# Patient Record
Sex: Female | Born: 1955
Health system: Southern US, Community
[De-identification: ages and names within clinical notes are randomized; demographics above are authoritative.]

## PROBLEM LIST (undated history)

## (undated) DIAGNOSIS — G43909 Migraine, unspecified, not intractable, without status migrainosus: Secondary | ICD-10-CM

## (undated) DIAGNOSIS — E039 Hypothyroidism, unspecified: Secondary | ICD-10-CM

## (undated) DIAGNOSIS — N915 Oligomenorrhea, unspecified: Secondary | ICD-10-CM

## (undated) DIAGNOSIS — R112 Nausea with vomiting, unspecified: Secondary | ICD-10-CM

## (undated) DIAGNOSIS — L709 Acne, unspecified: Secondary | ICD-10-CM

## (undated) DIAGNOSIS — F32A Depression, unspecified: Secondary | ICD-10-CM

## (undated) DIAGNOSIS — M75102 Unspecified rotator cuff tear or rupture of left shoulder, not specified as traumatic: Secondary | ICD-10-CM

## (undated) DIAGNOSIS — B069 Rubella without complication: Secondary | ICD-10-CM

## (undated) DIAGNOSIS — N83209 Unspecified ovarian cyst, unspecified side: Secondary | ICD-10-CM

## (undated) DIAGNOSIS — F329 Major depressive disorder, single episode, unspecified: Secondary | ICD-10-CM

## (undated) DIAGNOSIS — Z9889 Other specified postprocedural states: Secondary | ICD-10-CM

## (undated) DIAGNOSIS — R011 Cardiac murmur, unspecified: Secondary | ICD-10-CM

## (undated) DIAGNOSIS — E559 Vitamin D deficiency, unspecified: Secondary | ICD-10-CM

## (undated) DIAGNOSIS — M1990 Unspecified osteoarthritis, unspecified site: Secondary | ICD-10-CM

## (undated) DIAGNOSIS — M75101 Unspecified rotator cuff tear or rupture of right shoulder, not specified as traumatic: Secondary | ICD-10-CM

## (undated) DIAGNOSIS — Z973 Presence of spectacles and contact lenses: Secondary | ICD-10-CM

## (undated) HISTORY — PX: CRYOTHERAPY: SHX1416

## (undated) HISTORY — DX: Vitamin D deficiency, unspecified: E55.9

## (undated) HISTORY — DX: Oligomenorrhea, unspecified: N91.5

## (undated) HISTORY — DX: Unspecified ovarian cyst, unspecified side: N83.209

## (undated) HISTORY — PX: TONSILLECTOMY: SUR1361

## (undated) HISTORY — DX: Major depressive disorder, single episode, unspecified: F32.9

## (undated) HISTORY — DX: Acne, unspecified: L70.9

## (undated) HISTORY — DX: Migraine, unspecified, not intractable, without status migrainosus: G43.909

## (undated) HISTORY — DX: Rubella without complication: B06.9

## (undated) HISTORY — DX: Depression, unspecified: F32.A

## (undated) HISTORY — PX: OTHER SURGICAL HISTORY: SHX169

---

## 1998-11-07 ENCOUNTER — Other Ambulatory Visit: Admission: RE | Admit: 1998-11-07 | Discharge: 1998-11-07 | Payer: Self-pay | Admitting: Obstetrics and Gynecology

## 1999-04-07 ENCOUNTER — Ambulatory Visit (HOSPITAL_COMMUNITY): Admission: RE | Admit: 1999-04-07 | Discharge: 1999-04-07 | Payer: Self-pay | Admitting: Gastroenterology

## 1999-04-07 ENCOUNTER — Encounter: Payer: Self-pay | Admitting: Gastroenterology

## 1999-05-08 ENCOUNTER — Other Ambulatory Visit: Admission: RE | Admit: 1999-05-08 | Discharge: 1999-05-08 | Payer: Self-pay | Admitting: *Deleted

## 2000-06-26 ENCOUNTER — Encounter: Payer: Self-pay | Admitting: Gastroenterology

## 2000-06-28 ENCOUNTER — Other Ambulatory Visit: Admission: RE | Admit: 2000-06-28 | Discharge: 2000-06-28 | Payer: Self-pay | Admitting: Obstetrics & Gynecology

## 2001-11-05 ENCOUNTER — Other Ambulatory Visit: Admission: RE | Admit: 2001-11-05 | Discharge: 2001-11-05 | Payer: Self-pay | Admitting: Obstetrics & Gynecology

## 2002-10-29 HISTORY — PX: BUNIONECTOMY: SHX129

## 2002-11-10 ENCOUNTER — Other Ambulatory Visit: Admission: RE | Admit: 2002-11-10 | Discharge: 2002-11-10 | Payer: Self-pay | Admitting: Obstetrics & Gynecology

## 2003-11-23 ENCOUNTER — Other Ambulatory Visit: Admission: RE | Admit: 2003-11-23 | Discharge: 2003-11-23 | Payer: Self-pay | Admitting: Obstetrics & Gynecology

## 2008-09-02 ENCOUNTER — Encounter: Admission: RE | Admit: 2008-09-02 | Discharge: 2008-09-02 | Payer: Self-pay | Admitting: Specialist

## 2010-10-31 ENCOUNTER — Encounter (INDEPENDENT_AMBULATORY_CARE_PROVIDER_SITE_OTHER): Payer: Self-pay | Admitting: *Deleted

## 2010-11-08 ENCOUNTER — Telehealth: Payer: Self-pay | Admitting: Gastroenterology

## 2010-11-17 ENCOUNTER — Encounter (INDEPENDENT_AMBULATORY_CARE_PROVIDER_SITE_OTHER): Payer: Self-pay

## 2010-11-21 ENCOUNTER — Ambulatory Visit
Admission: RE | Admit: 2010-11-21 | Discharge: 2010-11-21 | Payer: Self-pay | Source: Home / Self Care | Attending: Gastroenterology | Admitting: Gastroenterology

## 2010-11-30 NOTE — Miscellaneous (Signed)
Summary: Lec previsit  Clinical Lists Changes  Medications: Added new medication of MOVIPREP 100 GM  SOLR (PEG-KCL-NACL-NASULF-NA ASC-C) As per prep instructions. - Signed Rx of MOVIPREP 100 GM  SOLR (PEG-KCL-NACL-NASULF-NA ASC-C) As per prep instructions.;  #1 x 0;  Signed;  Entered by: Ulis Rias RN;  Authorized by: Louis Meckel MD;  Method used: Electronically to [facility]*, [address] Shipping/mailing, [place], [place]  [address], Ph: [phone], Fax: [phone] Allergies: Added new allergy or adverse reaction of * LATEX Observations: Added new observation of NKA: F (11/21/2010 12:50)    Prescriptions: MOVIPREP 100 GM  SOLR (PEG-KCL-NACL-NASULF-NA ASC-C) As per prep instructions.  #1 x 0   Entered by:   Ulis Rias RN   Authorized by:   Louis Meckel MD   Signed by:   Ulis Rias RN on 11/21/2010   Method used:   Electronically to        [facility]* (retail)       [address]       [address] Shipping/mailing       [place], [place]  [address]       Ph: [phone]       Fax: [phone]   RxID:   [id number]

## 2010-11-30 NOTE — Letter (Signed)
Summary: [facility] Instructions  [facility]  [address] [place], [place] [address]   Phone: [phone]  Fax: [phone]       Stacey Kelly    1956-06-08    MRN: [id number]        Procedure Day /Date: Friday 12-15-10     Arrival Time: 1:00 pm     Procedure Time: 2:00 pm     Location of Procedure:                    _x _  [facility] (4th Floor)                        PREPARATION FOR COLONOSCOPY WITH MOVIPREP   Starting 5 days prior to your procedure  12-10-10 do not eat nuts, seeds, popcorn, corn, beans, peas,  salads, or any raw vegetables.  Do not take any fiber supplements (e.g. Metamucil, Citrucel, and Benefiber).  THE DAY BEFORE YOUR PROCEDURE         DATE:  12-14-10   DAY:  Thursday   1.  Drink clear liquids the entire day-NO SOLID FOOD  2.  Do not drink anything colored red or purple.  Avoid juices with pulp.  No orange juice.  3.  Drink at least 64 oz. (8 glasses) of fluid/clear liquids during the day to prevent dehydration and help the prep work efficiently.  CLEAR LIQUIDS INCLUDE: Water Jello Ice Popsicles Tea (sugar ok, no milk/cream) Powdered fruit flavored drinks Coffee (sugar ok, no milk/cream) Gatorade Juice: apple, white grape, white cranberry  Lemonade Clear bullion, consomm, broth Carbonated beverages (any kind) Strained chicken noodle soup Hard Candy                             4.  In the morning, mix first dose of MoviPrep solution:    Empty 1 Pouch A and 1 Pouch B into the disposable container    Add lukewarm drinking water to the top line of the container. Mix to dissolve    Refrigerate (mixed solution should be used within 24 hrs)  5.  Begin drinking the prep at 5:00 p.m. The MoviPrep container is divided by 4 marks.   Every 15 minutes drink the solution down to the next mark (approximately 8 oz) until the full liter is complete.   6.  Follow completed prep with 16 oz of clear liquid of your choice  (Nothing red or purple).  Continue to drink clear liquids until bedtime.  7.  Before going to bed, mix second dose of MoviPrep solution:    Empty 1 Pouch A and 1 Pouch B into the disposable container    Add lukewarm drinking water to the top line of the container. Mix to dissolve    Refrigerate  THE DAY OF YOUR PROCEDURE      DATE:  12-15-10  DAY:  Friday  Beginning at  9:00 a.m. (5 hours before procedure):         1. Every 15 minutes, drink the solution down to the next mark (approx 8 oz) until the full liter is complete.  2. Follow completed prep with 16 oz. of clear liquid of your choice.    3. You may drink clear liquids until  12:00 p.m.  (2 HOURS BEFORE PROCEDURE).   MEDICATION INSTRUCTIONS  Unless otherwise instructed, you should take regular prescription medications with a small sip  of water   as early as possible the morning of your procedure.         OTHER INSTRUCTIONS  You will need a responsible adult at least 53 years of age to accompany you and drive you home.   This person must remain in the waiting room during your procedure.  Wear loose fitting clothing that is easily removed.  Leave jewelry and other valuables at home.  However, you may wish to bring a book to read or  an iPod/MP3 player to listen to music as you wait for your procedure to start.  Remove all body piercing jewelry and leave at home.  Total time from sign-in until discharge is approximately 2-3 hours.  You should go home directly after your procedure and rest.  You can resume normal activities the  day after your procedure.  The day of your procedure you should not:   Drive   Make legal decisions   Operate machinery   Drink alcohol   Return to work  You will receive specific instructions about eating, activities and medications before you leave.    The above instructions have been reviewed and explained to me by   Ulis Rias RN  November 21, 2010 1:13 PM     I fully  understand and can verbalize these instructions _____________________________ Date _________

## 2010-11-30 NOTE — Progress Notes (Signed)
Summary: Triage  Phone Note Call from Patient Call back at [phone]   Caller: Patient Call For: Dr. Arlyce Dice Reason for Call: Talk to Nurse Summary of Call: Pt. is allergic to Latex and wants to discuss prior to her COL Initial call taken by: Karna Christmas,  November 08, 2010 9:12 AM  Follow-up for Phone Call        Spoke with patient and let her know that all products we use are latex free. Follow-up by: Selinda Michaels RN,  November 08, 2010 9:23 AM

## 2010-11-30 NOTE — Letter (Signed)
Summary: Pre Visit Letter Revised  [facility]  [address]   [place], [place] [address]   Phone: [phone]  Fax: [phone]        10/31/2010 MRN: [id number] Stacey Kelly [address] [place], [place]  [address]             Procedure Date:  December 15, 2010  Welcome to the Gastroenterology Division at [facility].    You are scheduled to see a nurse for your pre-procedure visit on November 21, 2010 at 1:00pm on the 3rd floor at [facility], [address]  We ask that you try to arrive at our office 15 minutes prior to your appointment time to allow for check-in.  Please take a minute to review the attached form.  If you answer "Yes" to one or more of the questions on the first page, we ask that you call the person listed at your earliest opportunity.  If you answer "No" to all of the questions, please complete the rest of the form and bring it to your appointment.    Your nurse visit will consist of discussing your medical and surgical history, your immediate family medical history, and your medications.   If you are unable to list all of your medications on the form, please bring the medication bottles to your appointment and we will list them.  We will need to be aware of both prescribed and over the counter drugs.  We will need to know exact dosage information as well.    Please be prepared to read and sign documents such as consent forms, a financial agreement, and acknowledgement forms.  If necessary, and with your consent, a friend or relative is welcome to sit-in on the nurse visit with you.  Please bring your insurance card so that we may make a copy of it.  If your insurance requires a referral to see a specialist, please bring your referral form from your primary care physician.  No co-pay is required for this nurse visit.     If you cannot keep your appointment, please call [phone] to cancel or reschedule prior to your appointment date.   This allows us the opportunity to schedule an appointment for another patient in need of care.    Thank you for choosing [facility] for your medical needs.  We appreciate the opportunity to care for you.  Please visit us at our website  to learn more about our practice.  Sincerely, The Gastroenterology Division         

## 2010-11-30 NOTE — Letter (Signed)
Summary: Pre Visit Letter Revised  [facility]  [address] [place], [place] [address]   Phone: [phone]  Fax: [phone]        10/31/2010 MRN: [id number] Stacey Kelly [facility] [place], [place]  [address]             Procedure Date:  December 15, 2010  Welcome to the Gastroenterology Division at [facility].    You are scheduled to see a nurse for your pre-procedure visit on November 21, 2010 at 1:00pm on the 3rd floor at [facility], [address]  We ask that you try to arrive at our office 15 minutes prior to your appointment time to allow for check-in.  Please take a minute to review the attached form.  If you answer "Yes" to one or more of the questions on the first page, we ask that you call the person listed at your earliest opportunity.  If you answer "No" to all of the questions, please complete the rest of the form and bring it to your appointment.    Your nurse visit will consist of discussing your medical and surgical history, your immediate family medical history, and your medications.   If you are unable to list all of your medications on the form, please bring the medication bottles to your appointment and we will list them.  We will need to be aware of both prescribed and over the counter drugs.  We will need to know exact dosage information as well.    Please be prepared to read and sign documents such as consent forms, a financial agreement, and acknowledgement forms.  If necessary, and with your consent, a friend or relative is welcome to sit-in on the nurse visit with you.  Please bring your insurance card so that we may make a copy of it.  If your insurance requires a referral to see a specialist, please bring your referral form from your primary care physician.  No co-pay is required for this nurse visit.     If you cannot keep your appointment, please call [phone] to cancel or reschedule prior to your appointment date.   This allows [place] the opportunity to schedule an appointment for another patient in need of care.    Thank you for choosing [facility] for your medical needs.  We appreciate the opportunity to care for you.  Please visit [place] at our website  to learn more about our practice.  Sincerely, The Gastroenterology Division

## 2010-12-15 ENCOUNTER — Other Ambulatory Visit (AMBULATORY_SURGERY_CENTER): Payer: 59 | Admitting: Gastroenterology

## 2010-12-15 ENCOUNTER — Encounter: Payer: Self-pay | Admitting: Gastroenterology

## 2010-12-15 DIAGNOSIS — Z1211 Encounter for screening for malignant neoplasm of colon: Secondary | ICD-10-CM

## 2010-12-20 NOTE — Procedures (Addendum)
Summary: Colonoscopy  Patient: Stacey Kelly Note: All result statuses are Final unless otherwise noted.  Tests: (1) Colonoscopy (COL)   COL Colonoscopy           DONE (C)     [facility]     [address]     [place], [place]  [address]           COLONOSCOPY PROCEDURE REPORT           PATIENT:  [name], [name]  MR#:  [id number]     BIRTHDATE:  Jan 01, 1956, 54 yrs. old  GENDER:  female           ENDOSCOPIST:  Barbette Hair. Arlyce Dice, MD     Referred by:  Silver Huguenin, M.D.           PROCEDURE DATE:  12/15/2010     PROCEDURE:  Diagnostic Colonoscopy     ASA CLASS:  Class I     INDICATIONS:  1) Routine Risk Screening           MEDICATIONS:   Fentanyl 75 mcg IV, Versed 7 mg IV, Benadryl 12.5     mg IV           DESCRIPTION OF PROCEDURE:   After the risks benefits and     alternatives of the procedure were thoroughly explained, informed     consent was obtained.  Digital rectal exam was performed and     revealed no abnormalities.   The LB CF-[id number] endoscope     was introduced through the anus and advanced to the cecum, which     was identified by both the appendix and ileocecal valve, without     limitations.  The quality of the prep was excellent, using     MoviPrep.  The instrument was then slowly withdrawn as the colon     was fully examined.     <<PROCEDUREIMAGES>>           FINDINGS:  A normal appearing cecum, ileocecal valve, and     appendiceal orifice were identified. The ascending, hepatic     flexure, transverse, splenic flexure, descending, sigmoid colon,     and rectum appeared unremarkable (see image1, image2, image3,     image4, image6, image9, image12, and image13).   Retroflexed views     in the rectum revealed no abnormalities.    The time to cecum =     6.0  minutes. The scope was then withdrawn (time =  8.50  min) from     the patient and the procedure completed.           COMPLICATIONS:  None           ENDOSCOPIC IMPRESSION:     1) Normal  colon     RECOMMENDATIONS:     1) Continue current colorectal screening recommendations for     "routine risk" patients with a repeat colonoscopy in 10 years.           REPEAT EXAM:   10 year(s) Colonoscopy           ______________________________     Barbette Hair. Arlyce Dice, MD           CC:           n.     REVISED:  12/19/2010 08:16 AM     eSIGNED:   Barbette Hair. Almalik Weissberg at 12/19/2010 08:16 AM           Rachael Darby,  [id number]  Note: An exclamation mark (!) indicates a result that was not dispersed into the flowsheet. Document Creation Date: 12/19/2010 8:16 AM _______________________________________________________________________  (1) Order result status: Final Collection or observation date-time: 12/15/2010 15:16 Requested date-time:  Receipt date-time:  Reported date-time:  Referring Physician:   Ordering Physician: Melvia Heaps [phone]) Specimen Source:  Source: Launa Grill Order Number: [id number] Lab site:

## 2010-12-26 NOTE — Letter (Signed)
Summary: [facility] records 2000-01  [facility] records 2000-01   Imported By: Lester Bryan 12/18/2010 10:45:15  _____________________________________________________________________  External Attachment:    Type:   Image     Comment:   External Document

## 2013-10-19 ENCOUNTER — Other Ambulatory Visit (HOSPITAL_COMMUNITY): Payer: Self-pay | Admitting: Specialist

## 2013-10-19 DIAGNOSIS — M25512 Pain in left shoulder: Secondary | ICD-10-CM

## 2013-10-27 ENCOUNTER — Ambulatory Visit (HOSPITAL_COMMUNITY): Payer: 59

## 2013-11-10 ENCOUNTER — Ambulatory Visit (HOSPITAL_COMMUNITY)
Admission: RE | Admit: 2013-11-10 | Discharge: 2013-11-10 | Disposition: A | Discharge status: Home / Self Care | Payer: 59 | Source: Ambulatory Visit | Attending: Specialist | Admitting: Specialist

## 2013-11-10 DIAGNOSIS — S46819A Strain of other muscles, fascia and tendons at shoulder and upper arm level, unspecified arm, initial encounter: Secondary | ICD-10-CM | POA: Insufficient documentation

## 2013-11-10 DIAGNOSIS — M25519 Pain in unspecified shoulder: Secondary | ICD-10-CM | POA: Insufficient documentation

## 2013-11-10 DIAGNOSIS — M719 Bursopathy, unspecified: Secondary | ICD-10-CM | POA: Insufficient documentation

## 2013-11-10 DIAGNOSIS — X58XXXA Exposure to other specified factors, initial encounter: Secondary | ICD-10-CM | POA: Insufficient documentation

## 2013-11-10 DIAGNOSIS — M25512 Pain in left shoulder: Secondary | ICD-10-CM

## 2013-11-10 DIAGNOSIS — M67919 Unspecified disorder of synovium and tendon, unspecified shoulder: Secondary | ICD-10-CM | POA: Insufficient documentation

## 2014-03-12 ENCOUNTER — Encounter (HOSPITAL_BASED_OUTPATIENT_CLINIC_OR_DEPARTMENT_OTHER): Payer: Self-pay | Admitting: *Deleted

## 2014-03-12 ENCOUNTER — Other Ambulatory Visit: Payer: Self-pay | Admitting: Orthopedic Surgery

## 2014-03-12 NOTE — Progress Notes (Signed)
NPO AFTER MN WITH EXCEPTION CLEAR LIQUIDS UNTIL 0830 (NO CREAM/ MILK PRODUCTS).  ARRIVE AT 1230. NEEDS HG. 

## 2014-03-12 NOTE — Progress Notes (Signed)
NPO AFTER MN WITH EXCEPTION CLEAR LIQUIDS UNTIL 0830 (NO CREAM/ MILK PRODUCTS).  ARRIVE AT 1230. NEEDS HG.

## 2014-03-17 ENCOUNTER — Telehealth: Payer: Self-pay | Admitting: *Deleted

## 2014-03-17 NOTE — Telephone Encounter (Signed)
I had bunion surgery years ago by Dr. Janus Molder.  One wire is coming through on the right, almost through the top of my foot.  Do they do a block in the office to remove it or do you actually go to the OR?  I called and informed her that Dr. Janus Molder has retired.  It depends upon the doctor where they want to do the procedure.  Some may do it here but most of the time they do it at an out patient surgical center.  She stated that's what she figured.  She said she will think about it and give [place] a call back once she plans her summer.

## 2014-03-18 ENCOUNTER — Encounter (HOSPITAL_BASED_OUTPATIENT_CLINIC_OR_DEPARTMENT_OTHER)
Admission: RE | Disposition: A | Discharge status: Home / Self Care | Payer: Self-pay | Source: Ambulatory Visit | Attending: Specialist

## 2014-03-18 ENCOUNTER — Ambulatory Visit (HOSPITAL_BASED_OUTPATIENT_CLINIC_OR_DEPARTMENT_OTHER)
Admission: RE | Admit: 2014-03-18 | Discharge: 2014-03-18 | Disposition: A | Discharge status: Home / Self Care | Payer: 59 | Source: Ambulatory Visit | Attending: Specialist | Admitting: Specialist

## 2014-03-18 ENCOUNTER — Encounter (HOSPITAL_BASED_OUTPATIENT_CLINIC_OR_DEPARTMENT_OTHER): Payer: 59 | Admitting: Anesthesiology

## 2014-03-18 ENCOUNTER — Ambulatory Visit (HOSPITAL_BASED_OUTPATIENT_CLINIC_OR_DEPARTMENT_OTHER): Payer: 59 | Admitting: Anesthesiology

## 2014-03-18 ENCOUNTER — Encounter (HOSPITAL_BASED_OUTPATIENT_CLINIC_OR_DEPARTMENT_OTHER): Payer: Self-pay | Admitting: *Deleted

## 2014-03-18 DIAGNOSIS — M19019 Primary osteoarthritis, unspecified shoulder: Secondary | ICD-10-CM | POA: Insufficient documentation

## 2014-03-18 DIAGNOSIS — M719 Bursopathy, unspecified: Principal | ICD-10-CM | POA: Insufficient documentation

## 2014-03-18 DIAGNOSIS — Z9889 Other specified postprocedural states: Secondary | ICD-10-CM

## 2014-03-18 DIAGNOSIS — M67919 Unspecified disorder of synovium and tendon, unspecified shoulder: Secondary | ICD-10-CM | POA: Insufficient documentation

## 2014-03-18 HISTORY — DX: Unspecified osteoarthritis, unspecified site: M19.90

## 2014-03-18 HISTORY — PX: SHOULDER ARTHROSCOPY WITH ROTATOR CUFF REPAIR: SHX5685

## 2014-03-18 HISTORY — DX: Unspecified rotator cuff tear or rupture of left shoulder, not specified as traumatic: M75.102

## 2014-03-18 LAB — POCT HEMOGLOBIN-HEMACUE: Hemoglobin: 12.6 g/dL (ref 12.0–15.0)

## 2014-03-18 SURGERY — ARTHROSCOPY, SHOULDER, WITH ROTATOR CUFF REPAIR
Anesthesia: General | Site: Shoulder | Laterality: Left

## 2014-03-18 MED ORDER — MIDAZOLAM HCL 2 MG/2ML IJ SOLN
2.0000 mg | Freq: Once | INTRAMUSCULAR | Status: AC
  Administered 2014-03-18: 2 mg via INTRAVENOUS
  Filled 2014-03-18: qty 2

## 2014-03-18 MED ORDER — ONDANSETRON HCL 4 MG/2ML IJ SOLN
INTRAMUSCULAR | Status: DC | PRN
  Administered 2014-03-18: 4 mg via INTRAVENOUS

## 2014-03-18 MED ORDER — DEXAMETHASONE SODIUM PHOSPHATE 4 MG/ML IJ SOLN
INTRAMUSCULAR | Status: DC | PRN
  Administered 2014-03-18: 10 mg via INTRAVENOUS

## 2014-03-18 MED ORDER — MEPERIDINE HCL 25 MG/ML IJ SOLN
6.2500 mg | INTRAMUSCULAR | Status: DC | PRN
  Filled 2014-03-18: qty 1

## 2014-03-18 MED ORDER — MIDAZOLAM HCL 2 MG/2ML IJ SOLN
INTRAMUSCULAR | Status: AC
  Filled 2014-03-18: qty 2

## 2014-03-18 MED ORDER — FENTANYL CITRATE 0.05 MG/ML IJ SOLN
INTRAMUSCULAR | Status: DC | PRN
  Administered 2014-03-18: 25 ug via INTRAVENOUS

## 2014-03-18 MED ORDER — PROMETHAZINE HCL 25 MG/ML IJ SOLN
6.2500 mg | INTRAMUSCULAR | Status: DC | PRN
  Filled 2014-03-18: qty 1

## 2014-03-18 MED ORDER — CEFAZOLIN SODIUM-DEXTROSE 2-3 GM-% IV SOLR
2.0000 g | INTRAVENOUS | Status: AC
  Administered 2014-03-18: 2 g via INTRAVENOUS
  Filled 2014-03-18: qty 50

## 2014-03-18 MED ORDER — SODIUM CHLORIDE 0.9 % IV SOLN
INTRAVENOUS | Status: DC
  Filled 2014-03-18: qty 1000

## 2014-03-18 MED ORDER — MIDAZOLAM HCL 5 MG/5ML IJ SOLN
INTRAMUSCULAR | Status: DC | PRN
  Administered 2014-03-18: 1 mg via INTRAVENOUS

## 2014-03-18 MED ORDER — ROPIVACAINE HCL 5 MG/ML IJ SOLN
INTRAMUSCULAR | Status: DC | PRN
  Administered 2014-03-18: 30 mL via PERINEURAL

## 2014-03-18 MED ORDER — SUCCINYLCHOLINE CHLORIDE 20 MG/ML IJ SOLN
INTRAMUSCULAR | Status: DC | PRN
Start: 2014-03-18 — End: 2014-03-18
  Administered 2014-03-18: 100 mg via INTRAVENOUS

## 2014-03-18 MED ORDER — LIDOCAINE HCL (CARDIAC) 20 MG/ML IV SOLN
INTRAVENOUS | Status: DC | PRN
  Administered 2014-03-18: 60 mg via INTRAVENOUS

## 2014-03-18 MED ORDER — LACTATED RINGERS IV SOLN
INTRAVENOUS | Status: DC
  Filled 2014-03-18: qty 1000

## 2014-03-18 MED ORDER — SODIUM CHLORIDE 0.9 % IR SOLN
Status: DC | PRN
  Administered 2014-03-18: 27000 mL

## 2014-03-18 MED ORDER — FENTANYL CITRATE 0.05 MG/ML IJ SOLN
INTRAMUSCULAR | Status: AC
  Filled 2014-03-18: qty 6

## 2014-03-18 MED ORDER — PROPOFOL 10 MG/ML IV BOLUS
INTRAVENOUS | Status: DC | PRN
  Administered 2014-03-18: 180 mg via INTRAVENOUS

## 2014-03-18 MED ORDER — FENTANYL CITRATE 0.05 MG/ML IJ SOLN
INTRAMUSCULAR | Status: AC
  Filled 2014-03-18: qty 2

## 2014-03-18 MED ORDER — LACTATED RINGERS IV SOLN
INTRAVENOUS | Status: DC
  Administered 2014-03-18 (×2): via INTRAVENOUS
  Filled 2014-03-18: qty 1000

## 2014-03-18 MED ORDER — FENTANYL CITRATE 0.05 MG/ML IJ SOLN
25.0000 ug | INTRAMUSCULAR | Status: DC | PRN
  Filled 2014-03-18: qty 1

## 2014-03-18 MED ORDER — FENTANYL CITRATE 0.05 MG/ML IJ SOLN
100.0000 ug | Freq: Once | INTRAMUSCULAR | Status: AC
  Administered 2014-03-18: 100 ug via INTRAVENOUS
  Filled 2014-03-18: qty 2

## 2014-03-18 MED ORDER — POVIDONE-IODINE 7.5 % EX SOLN
Freq: Once | CUTANEOUS | Status: DC
  Filled 2014-03-18: qty 118

## 2014-03-18 MED ORDER — SODIUM CHLORIDE 0.9 % IJ SOLN
INTRAMUSCULAR | Status: DC | PRN
  Administered 2014-03-18: 18:00:00 via INTRAMUSCULAR

## 2014-03-18 SURGICAL SUPPLY — 84 items
ANCHOR SUT BIO SW 4.75X19.1 (Anchor) ×6 IMPLANT
BLADE CUDA GRT WHITE 3.5 (BLADE) ×3 IMPLANT
BLADE CUTTER GATOR 3.5 (BLADE) IMPLANT
BLADE GREAT WHITE 4.2 (BLADE) ×2 IMPLANT
BLADE GREAT WHITE 4.2MM (BLADE) ×1
BLADE SURG 11 STRL SS (BLADE) ×3 IMPLANT
BLADE SURG 15 STRL LF DISP TIS (BLADE) ×1 IMPLANT
BLADE SURG 15 STRL SS (BLADE) ×2
BUR 3.5 LG SPHERICAL (BURR) IMPLANT
BUR OVAL 6.0 (BURR) ×3 IMPLANT
BURR 3.5 LG SPHERICAL (BURR)
BURR 3.5MM LG SPHERICAL (BURR)
CANISTER SUCT LVC 12 LTR MEDI- (MISCELLANEOUS) ×9 IMPLANT
CANISTER SUCTION 2500CC (MISCELLANEOUS) IMPLANT
CANNULA 5.75X7 CRYSTAL CLEAR (CANNULA) ×3 IMPLANT
CANNULA 5.75X71 LONG (CANNULA) IMPLANT
CANNULA TWIST IN 8.25X7CM (CANNULA) ×6 IMPLANT
CLOTH BEACON ORANGE TIMEOUT ST (SAFETY) ×3 IMPLANT
COVER MAYO STAND STRL (DRAPES) ×3 IMPLANT
COVER TABLE BACK 60X90 (DRAPES) ×3 IMPLANT
DRAPE LG THREE QUARTER DISP (DRAPES) IMPLANT
DRAPE ORTHO SPLIT 77X108 STRL (DRAPES) ×4
DRAPE POUCH INSTRU U-SHP 10X18 (DRAPES) ×3 IMPLANT
DRAPE STERI 35X30 U-POUCH (DRAPES) ×3 IMPLANT
DRAPE SURG 17X23 STRL (DRAPES) ×3 IMPLANT
DRAPE SURG ORHT 6 SPLT 77X108 (DRAPES) ×2 IMPLANT
DRAPE U-SHAPE 47X51 STRL (DRAPES) ×3 IMPLANT
DRSG PAD ABDOMINAL 8X10 ST (GAUZE/BANDAGES/DRESSINGS) ×3 IMPLANT
DURAPREP 26ML APPLICATOR (WOUND CARE) ×3 IMPLANT
ELECT MENISCUS 165MM 90D (ELECTRODE) IMPLANT
ELECT REM PT RETURN 9FT ADLT (ELECTROSURGICAL) ×3
ELECTRODE REM PT RTRN 9FT ADLT (ELECTROSURGICAL) ×1 IMPLANT
FIBERSTICK 2 (SUTURE) ×3 IMPLANT
GAUZE XEROFORM 1X8 LF (GAUZE/BANDAGES/DRESSINGS) ×3 IMPLANT
GLOVE BIO SURGEON STRL SZ7.5 (GLOVE) IMPLANT
GLOVE BIOGEL PI IND STRL 7.5 (GLOVE) ×3 IMPLANT
GLOVE BIOGEL PI INDICATOR 7.5 (GLOVE) ×6
GLOVE INDICATOR 8.0 STRL GRN (GLOVE) ×6 IMPLANT
GLOVE SURG ORTHO 8.0 STRL STRW (GLOVE) IMPLANT
GLOVE SURG SS PI 7.5 STRL IVOR (GLOVE) ×12 IMPLANT
GOWN PREVENTION PLUS LG XLONG (DISPOSABLE) IMPLANT
GOWN STRL REIN XL XLG (GOWN DISPOSABLE) IMPLANT
GOWN STRL REUS W/ TWL XL LVL3 (GOWN DISPOSABLE) ×3 IMPLANT
GOWN STRL REUS W/TWL XL LVL3 (GOWN DISPOSABLE) ×6
IV NS IRRIG 3000ML ARTHROMATIC (IV SOLUTION) ×18 IMPLANT
KIT SHOULDER TRACTION (DRAPES) ×3 IMPLANT
LASSO CRESCENT QUICKPASS (SUTURE) ×3 IMPLANT
LASSO SUT 90 DEGREE (SUTURE) IMPLANT
NEEDLE 1/2 CIR CATGUT .05X1.09 (NEEDLE) IMPLANT
NEEDLE HYPO 22GX1.5 SAFETY (NEEDLE) ×3 IMPLANT
NEEDLE SCORPION (NEEDLE) IMPLANT
NEEDLE SCORPION MULTI FIRE (NEEDLE) ×3 IMPLANT
NEEDLE SPNL 18GX3.5 QUINCKE PK (NEEDLE) ×3 IMPLANT
NS IRRIG 500ML POUR BTL (IV SOLUTION) IMPLANT
PACK BASIN DAY SURGERY FS (CUSTOM PROCEDURE TRAY) ×3 IMPLANT
PAD ABD 8X10 STRL (GAUZE/BANDAGES/DRESSINGS) ×6 IMPLANT
PENCIL BUTTON HOLSTER BLD 10FT (ELECTRODE) IMPLANT
SET ARTHROSCOPY TUBING (MISCELLANEOUS) ×2
SET ARTHROSCOPY TUBING PVC (MISCELLANEOUS) ×1 IMPLANT
SLING ULTRA II AB L (ORTHOPEDIC SUPPLIES) IMPLANT
SLING ULTRA II AB S (ORTHOPEDIC SUPPLIES) ×3 IMPLANT
SPONGE GAUZE 4X4 12PLY (GAUZE/BANDAGES/DRESSINGS) ×3 IMPLANT
SPONGE GAUZE 4X4 12PLY STER LF (GAUZE/BANDAGES/DRESSINGS) ×3 IMPLANT
SPONGE LAP 4X18 X RAY DECT (DISPOSABLE) IMPLANT
SUCTION FRAZIER TIP 10 FR DISP (SUCTIONS) IMPLANT
SUT ETHILON 3 0 PS 1 (SUTURE) ×3 IMPLANT
SUT LASSO 45 DEGREE LEFT (SUTURE) IMPLANT
SUT LASSO 45D RIGHT (SUTURE) IMPLANT
SUT PDS AB 1 CT1 27 (SUTURE) IMPLANT
SUT TIGER TAPE 7 IN WHITE (SUTURE) ×3 IMPLANT
SUT VIC AB 0 CT1 36 (SUTURE) IMPLANT
SUT VIC AB 2-0 CT1 27 (SUTURE)
SUT VIC AB 2-0 CT1 TAPERPNT 27 (SUTURE) IMPLANT
SYR 20CC LL (SYRINGE) ×3 IMPLANT
SYR CONTROL 10ML LL (SYRINGE) IMPLANT
SYR TB 1ML 27GX1/2 SAFE (SYRINGE) ×1 IMPLANT
SYR TB 1ML 27GX1/2 SAFETY (SYRINGE) ×2
TAPE CLOTH SURG 6X10 WHT LF (GAUZE/BANDAGES/DRESSINGS) ×3 IMPLANT
TOWEL OR 17X24 6PK STRL BLUE (TOWEL DISPOSABLE) ×3 IMPLANT
TUBE CONNECTING 12'X1/4 (SUCTIONS) ×2
TUBE CONNECTING 12X1/4 (SUCTIONS) ×4 IMPLANT
WAND 90 DEG TURBOVAC W/CORD (SURGICAL WAND) ×3 IMPLANT
WATER STERILE IRR 500ML POUR (IV SOLUTION) ×3 IMPLANT
YANKAUER SUCT BULB TIP NO VENT (SUCTIONS) IMPLANT

## 2014-03-18 NOTE — Anesthesia Preprocedure Evaluation (Addendum)
Anesthesia Evaluation  Patient identified by MRN, date of birth, ID band Patient awake    Reviewed: Allergy & Precautions, H&P , NPO status , Patient's Chart, lab work & pertinent test results  History of Anesthesia Complications (+) PONV  Airway Mallampati: II TM Distance: >3 FB Neck ROM: Full    Dental no notable dental hx.    Pulmonary neg pulmonary ROS,  breath sounds clear to auscultation  Pulmonary exam normal       Cardiovascular negative cardio ROS  Rhythm:Regular Rate:Normal     Neuro/Psych negative neurological ROS  negative psych ROS   GI/Hepatic negative GI ROS, Neg liver ROS,   Endo/Other  negative endocrine ROS  Renal/GU negative Renal ROS  negative genitourinary   Musculoskeletal negative musculoskeletal ROS (+)   Abdominal   Peds negative pediatric ROS (+)  Hematology negative hematology ROS (+)   Anesthesia Other Findings   Reproductive/Obstetrics negative OB ROS                          Anesthesia Physical Anesthesia Plan  ASA: II  Anesthesia Plan: General   Post-op Pain Management:    Induction: Intravenous  Airway Management Planned: Oral ETT  Additional Equipment:   Intra-op Plan:   Post-operative Plan: Extubation in OR  Informed Consent: I have reviewed the patients History and Physical, chart, labs and discussed the procedure including the risks, benefits and alternatives for the proposed anesthesia with the patient or authorized representative who has indicated his/her understanding and acceptance.   Dental advisory given  Plan Discussed with: CRNA  Anesthesia Plan Comments: (L SBC)        Anesthesia Quick Evaluation

## 2014-03-18 NOTE — H&P (Signed)
Stacey Kelly is an 58 y.o. female.   Chief Complaint: Left shoulder pain HPI: Patient presents with joint discomfort that had been persistent for several months now. Despite conservative treatments, her discomfort has not improved. Imaging was obtained. Other conservative and surgical treatments were discussed in detail. Patient wishes to proceed with surgery as consented. Denies SOB, CP, or calf pain. No Fever, chills, or nausea/ vomiting.   Past Medical History  Diagnosis Date  . OA (osteoarthritis)     LEFT SHOULDER AC JOINT  . Left rotator cuff tear   . PONV (postoperative nausea and vomiting)   . Heart murmur     ASYMPTOMATIC--  ECHO  1995  MILD REGURG    Past Surgical History  Procedure Laterality Date  . Right shoulder surgery  X3  LAST ONE 2010    INCLUDING ROTATOR CUFF REPAIR /  RECONSTRUCTION  . Bunionectomy Right 2004    BOTH SIDES OF FOOT  . Tonsillectomy  AGE 89    History reviewed. No pertinent family history. Social History:  reports that she has never smoked. She has never used smokeless tobacco. She reports that she does not drink alcohol or use illicit drugs.  Allergies:  Allergies  Allergen Reactions  . Latex Anaphylaxis    Medications Prior to Admission  Medication Sig Dispense Refill  . cetirizine (ZYRTEC) 10 MG tablet Take 10 mg by mouth every evening.      . Cholecalciferol (VITAMIN D3) 2000 UNITS TABS Take 1 capsule by mouth daily.      . diclofenac sodium (VOLTAREN) 1 % GEL Apply topically 4 (four) times daily as needed.      . fluticasone (FLONASE) 50 MCG/ACT nasal spray Place 1 spray into both nostrils 2 (two) times daily.      . Multiple Vitamin (MULTIVITAMIN) tablet Take 1 tablet by mouth daily.      . Calcium Carb-Cholecalciferol (CALCIUM 600 + D PO) Take 1 tablet by mouth 2 (two) times daily.        Results for orders placed during the hospital encounter of 03/18/14 (from the past 48 hour(s))  POCT HEMOGLOBIN-HEMACUE     Status: None   Collection Time    03/18/14  2:22 PM      Result Value Ref Range   Hemoglobin 12.6  12.0 - 15.0 g/dL   No results found.  Review of Systems  Constitutional: Negative.   HENT: Negative.   Eyes: Negative.   Respiratory: Negative.   Cardiovascular: Negative.   Gastrointestinal: Negative.   Genitourinary: Negative.   Musculoskeletal: Positive for joint pain.  Skin: Negative.   Neurological: Negative.   Endo/Heme/Allergies: Negative.   Psychiatric/Behavioral: Negative.     Blood pressure 117/75, pulse 68, temperature 97.5 F (36.4 C), temperature source Oral, resp. rate 16, height 5' 5.5" (1.664 m), weight 69.627 kg (153 lb 8 oz), SpO2 98.00%. Physical Exam  Constitutional: She is oriented to person, place, and time. She appears well-developed.  HENT:  Head: Normocephalic.  Eyes: EOM are normal.  Neck: Normal range of motion.  Cardiovascular: Normal rate, normal heart sounds and intact distal pulses.   Respiratory: Effort normal.  GI: Soft.  Genitourinary:  Deferred  Musculoskeletal:  Left shoulder weakness  Neurological: She is alert and oriented to person, place, and time.  Skin: Skin is warm and dry.  Psychiatric: Her behavior is normal.     Assessment/Plan Left shoulder RCT: Arthroscopy with SAD, RCR, DCR, and biceps tendonesis D/C home today Follow up in the  office Follow instructions Patient already has Rx  Trapper Meech L Kadi Hession 03/18/2014, 2:56 PM

## 2014-03-18 NOTE — Discharge Instructions (Signed)
°  Post Anesthesia Home Care Instructions ° °Activity: °Get plenty of rest for the remainder of the day. A responsible adult should stay with you for 24 hours following the procedure.  °For the next 24 hours, DO NOT: °-Drive a car °-Operate machinery °-Drink alcoholic beverages °-Take any medication unless instructed by your physician °-Make any legal decisions or sign important papers. ° °Meals: °Start with liquid foods such as gelatin or soup. Progress to regular foods as tolerated. Avoid greasy, spicy, heavy foods. If nausea and/or vomiting occur, drink only clear liquids until the nausea and/or vomiting subsides. Call your physician if vomiting continues. ° °Special Instructions/Symptoms: °Your throat may feel dry or sore from the anesthesia or the breathing tube placed in your throat during surgery. If this causes discomfort, gargle with warm salt water. The discomfort should disappear within 24 hours. ° °Regional Anesthesia Blocks ° °1. Numbness or the inability to move the "blocked" extremity may last from 3-48 hours after placement. The length of time depends on the medication injected and your individual response to the medication. If the numbness is not going away after 48 hours, call your surgeon. ° °2. The extremity that is blocked will need to be protected until the numbness is gone and the  Strength has returned. Because you cannot feel it, you will need to take extra care to avoid injury. Because it may be weak, you may have difficulty moving it or using it. You may not know what position it is in without looking at it while the block is in effect. ° °3. For blocks in the legs and feet, returning to weight bearing and walking needs to be done carefully. You will need to wait until the numbness is entirely gone and the strength has returned. You should be able to move your leg and foot normally before you try and bear weight or walk. You will need someone to be with you when you first try to ensure you  do not fall and possibly risk injury. ° °4. Bruising and tenderness at the needle site are common side effects and will resolve in a few days. ° °5. Persistent numbness or new problems with movement should be communicated to the surgeon or the [facility] ([phone])/ [facility] ([phone]). °

## 2014-03-18 NOTE — Anesthesia Procedure Notes (Addendum)
Anesthesia Regional Block:  Supraclavicular block  Pre-Anesthetic Checklist: ,, timeout performed, Correct Patient, Correct Site, Correct Laterality, Correct Procedure, Correct Position, site marked, Risks and benefits discussed,  Surgical consent,  Pre-op evaluation,  At surgeon's request and post-op pain management  Laterality: Left and Upper  Prep: chloraprep       Needles:  Injection technique: Single-shot  Needle Type: Stimiplex     Needle Length: 10cm 10 cm Needle Gauge: 21 and 21 G    Additional Needles:  Procedures: ultrasound guided (picture in chart) and nerve stimulator Supraclavicular block Narrative:  Injection made incrementally with aspirations every 5 mL.  Performed by: Personally  Anesthesiologist: Montez Hageman MD  Additional Notes: Risks, benefits and alternative to block explained extensively.  Patient tolerated procedure well, without complications.   Procedure Name: Intubation Date/Time: 03/18/2014 4:56 PM Performed by: Denna Haggard D Pre-anesthesia Checklist: Patient identified, Emergency Drugs available, Suction available and Patient being monitored Patient Re-evaluated:Patient Re-evaluated prior to inductionOxygen Delivery Method: Circle System Utilized Preoxygenation: Pre-oxygenation with 100% oxygen Intubation Type: IV induction Ventilation: Mask ventilation without difficulty Laryngoscope Size: Mac and 4 Grade View: Grade I Tube type: Oral Tube size: 7.0 mm Number of attempts: 1 Airway Equipment and Method: stylet and oral airway Placement Confirmation: ETT inserted through vocal cords under direct vision,  positive ETCO2 and breath sounds checked- equal and bilateral Secured at: 22 cm Tube secured with: Tape Dental Injury: Teeth and Oropharynx as per pre-operative assessment

## 2014-03-18 NOTE — Transfer of Care (Signed)
Immediate Anesthesia Transfer of Care Note  Patient: Stacey Kelly  Procedure(s) Performed: Procedure(s) (LRB): LEFT SHOULDER ARTHROSCOPY WITH EVALUATION UNDER ANESTHESIA DEBRIDEMENT SUBACROMIAL DECOMPRESSION DISTAL CLAVICAL RESECTION ROTATOR CUFF REPAIR BICEPS  TENDINOSIS (Left)  Patient Location: PACU  Anesthesia Type: General  Level of Consciousness: awake, oriented, sedated and patient cooperative  Airway & Oxygen Therapy: Patient Spontanous Breathing and Patient connected to face mask oxygen  Post-op Assessment: Report given to PACU RN and Post -op Vital signs reviewed and stable  Post vital signs: Reviewed and stable  Complications: No apparent anesthesia complications

## 2014-03-18 NOTE — Op Note (Signed)
Dictated # [id number]

## 2014-03-19 ENCOUNTER — Encounter (HOSPITAL_BASED_OUTPATIENT_CLINIC_OR_DEPARTMENT_OTHER): Payer: Self-pay | Admitting: Specialist

## 2014-03-19 NOTE — Op Note (Signed)
Stacey Kelly, Stacey NO.:  [id number]  MEDICAL RECORD NO.:  [id number]  LOCATION:                                 FACILITY:  PHYSICIAN:  Cynda Familia, M.D.DATE OF BIRTH:  [birth year]  DATE OF PROCEDURE:  03/18/2014 DATE OF DISCHARGE:                              OPERATIVE REPORT   PREOPERATIVE DIAGNOSES:  Left shoulder rotator cuff tear, acromioclavicular arthritis, biceps subluxation and dislocation with tendinosis.  POSTOPERATIVE DIAGNOSES: 1. Left shoulder extensive partial tear in the biceps tendon with     severe extra-articular tendinosis and partial tearing. 2. Rotator cuff tear, upper border subscapularis, rotator cuff     interval, and supraspinatus. 3. Acromioclavicular arthritis.  PROCEDURE: 1. Right shoulder glenohumeral arthroscopy with biceps tenotomy. 2. Arthroscopic subacromial decompression, acromioplasty, bursectomy,     and CA ligament release. 3. Arthroscopic distal clavicle resection Mumford procedure. 4. Arthroscopic rotator cuff repair.  SURGEON:  Cynda Familia, M.D.  ASSISTANT:  Wyatt Portela, PA-C.  ANESTHESIA:  Interscalene block, general.  BLOOD LOSS:  Less than 10 mL.  DRAINS:  None.  COMPLICATIONS:  None.  DISPOSITION:  PACU, stable.  OPERATIVE DETAILS:  The patient and family were counseled in the holding area.  Correct site was identified.  IV was started, sedation was given, block was administered, and correct site was marked.  On the way to the operating room, IV Ancef was given.  In the OR, placed in supine position under general anesthesia.  Turned to the right lateral decubitus position, properly padded and bumped.  PAS stockings were applied to both lower extremities for DVT prophylaxis.  Left shoulder was examined.  Full range of motion stable.  She was then prepped with DuraPrep and draped in a sterile fashion.  Overhead shoulder position was utilized, 40 degrees of abduction, 10  degrees of forward flexion, and 10-pound longitudinal traction.  Time-out was done again confirmed on the left side.  Posterior portal was created.  Arthroscope was placed into the glenohumeral joint.  The glenohumeral joint showed mild chondral degenerative changes.  The labrum showed mild degenerative changes, but no frank tearing of the biceps, intra-articularly relatively healthy.  Lateral port was established.  The rotator cuff tear was easily identified.  Neurovascular structures were protected from axillary nerve __________ biceps proximally and found that the portion of the bicipital groove was severely partially torn with severe biceps tendinosis.  At this point in time, the biceps were raised __________ supraglenoid tubercle and now to retract.  Proximal portion was debrided.  We then reinspected the rotator cuff.  There is obvious supraspinous tear with retraction.  It was mobilized.  Completely brought back to the greater tuberosity region.  The __________ tendinosis with partial tearing, but longitudinally upper portion __________ remained intact.  The ArthroCare system was utilized __________ periosteum CA ligament. Bur was then placed posteriorly and anterior, inferior, and lateral acromioplasty was performed converting to a flat acromion.  She had a type 3 acromion prior to this.  __________ subacromial space.  Accessory entry portal was made.  A bur was then placed.  Lateral 5-8 mm of the clavicle was removed circumferentially __________ superior capsule intact.  The clavicle was palpated, found to be stable, debris was removed.  Hemostasis was obtained.  At this point in time, the bur was then utilized to bur the greater tuberosity down to bleeding bone.  She also had a prominent greater tuberosity on x-ray and tuberoplasty was performed.  Small puncture wound was made superiorly and Arthrex Biocomposite anchor was placed in the greater tuberosity of footprint region.   At this point in time, 4 sutures were placed 2 fiber tapes and two #2 FiberWire.  The arm was then abducted and this incision brought down into a swivel lock anchor and then tapped the screw in position giving excellent fixation of rotator cuff __________ the greater tuberosity raw bone.  At this point in time, we felt that we had satisfactory repair of the rotator cuff down to bleeding bone __________ without excessive tension and the __________ tissue was of fair quality. It was not robust and healthy, but it was of fair quality.  There was no other abnormalities noted.  __________ removed.  The __________.  The ports were closed with 4-0 nylon suture.  Another 10 mL of 0.25 Sensorcaine placed in the skin edges.  Sterile dressing was applied to the shoulder.  Shoulder was placed in shoulder immobilizer.  Slight external rotation.  Awakened.  She was taken from the operating room to the PACU in stable condition.  She will be stabilized in PACU and discharged home.  To help the patient positioning, prepping and draping, technical and surgical assistance throughout the entire case, wound closure, application of dressing and sling, Mr. Wyatt Portela, PA-C assistance was needed.          ______________________________ Cynda Familia, M.D.     RAC/MEDQ  D:  03/18/2014  T:  03/19/2014  Job:  [id number]

## 2014-03-25 ENCOUNTER — Ambulatory Visit: Payer: 59 | Attending: Specialist | Admitting: Physical Therapy

## 2014-03-25 DIAGNOSIS — M25519 Pain in unspecified shoulder: Secondary | ICD-10-CM | POA: Insufficient documentation

## 2014-03-25 DIAGNOSIS — M25619 Stiffness of unspecified shoulder, not elsewhere classified: Secondary | ICD-10-CM | POA: Insufficient documentation

## 2014-03-25 DIAGNOSIS — R609 Edema, unspecified: Secondary | ICD-10-CM | POA: Insufficient documentation

## 2014-03-25 DIAGNOSIS — IMO0001 Reserved for inherently not codable concepts without codable children: Secondary | ICD-10-CM | POA: Insufficient documentation

## 2014-03-25 NOTE — Anesthesia Postprocedure Evaluation (Signed)
  Anesthesia Post-op Note  Patient: Stacey Kelly  Procedure(s) Performed: Procedure(s) (LRB): LEFT SHOULDER ARTHROSCOPY WITH EVALUATION UNDER ANESTHESIA DEBRIDEMENT SUBACROMIAL DECOMPRESSION DISTAL CLAVICAL RESECTION ROTATOR CUFF REPAIR BICEPS  TENDINOSIS (Left)  Patient Location: PACU  Anesthesia Type: General  Level of Consciousness: awake and alert   Airway and Oxygen Therapy: Patient Spontanous Breathing  Post-op Pain: mild  Post-op Assessment: Post-op Vital signs reviewed, Patient's Cardiovascular Status Stable, Respiratory Function Stable, Patent Airway and No signs of Nausea or vomiting  Last Vitals:  Filed Vitals:   03/18/14 2029  BP: 122/74  Pulse: 77  Temp: 36.4 C  Resp: 18    Post-op Vital Signs: stable   Complications: No apparent anesthesia complications

## 2014-03-29 ENCOUNTER — Ambulatory Visit: Payer: 59 | Attending: Specialist | Admitting: Physical Therapy

## 2014-03-29 DIAGNOSIS — IMO0001 Reserved for inherently not codable concepts without codable children: Secondary | ICD-10-CM | POA: Insufficient documentation

## 2014-03-29 DIAGNOSIS — M25519 Pain in unspecified shoulder: Secondary | ICD-10-CM | POA: Insufficient documentation

## 2014-03-29 DIAGNOSIS — R609 Edema, unspecified: Secondary | ICD-10-CM | POA: Insufficient documentation

## 2014-03-29 DIAGNOSIS — M25619 Stiffness of unspecified shoulder, not elsewhere classified: Secondary | ICD-10-CM | POA: Insufficient documentation

## 2014-03-29 NOTE — Addendum Note (Signed)
Addendum created 03/29/14 1240 by Myrtie Soman, MD   Modules edited: Anesthesia Responsible Staff

## 2014-04-01 ENCOUNTER — Ambulatory Visit: Payer: 59 | Admitting: Physical Therapy

## 2014-04-05 ENCOUNTER — Ambulatory Visit: Payer: 59 | Admitting: Physical Therapy

## 2014-04-08 ENCOUNTER — Ambulatory Visit: Payer: 59 | Admitting: Physical Therapy

## 2014-04-09 ENCOUNTER — Encounter (HOSPITAL_BASED_OUTPATIENT_CLINIC_OR_DEPARTMENT_OTHER): Payer: Self-pay | Admitting: *Deleted

## 2014-04-09 NOTE — Progress Notes (Signed)
Just had lt shoulder surgery 03/18/14 Says this is right foot

## 2014-04-12 ENCOUNTER — Ambulatory Visit: Payer: 59 | Admitting: Physical Therapy

## 2014-04-14 ENCOUNTER — Other Ambulatory Visit: Payer: Self-pay | Admitting: Orthopedic Surgery

## 2014-04-15 ENCOUNTER — Encounter (HOSPITAL_BASED_OUTPATIENT_CLINIC_OR_DEPARTMENT_OTHER)
Admission: RE | Disposition: A | Discharge status: Home / Self Care | Payer: Self-pay | Source: Ambulatory Visit | Attending: Orthopedic Surgery

## 2014-04-15 ENCOUNTER — Encounter (HOSPITAL_BASED_OUTPATIENT_CLINIC_OR_DEPARTMENT_OTHER): Payer: Self-pay | Admitting: Certified Registered"

## 2014-04-15 ENCOUNTER — Encounter (HOSPITAL_BASED_OUTPATIENT_CLINIC_OR_DEPARTMENT_OTHER): Payer: 59 | Admitting: Certified Registered"

## 2014-04-15 ENCOUNTER — Ambulatory Visit (HOSPITAL_BASED_OUTPATIENT_CLINIC_OR_DEPARTMENT_OTHER): Payer: 59 | Admitting: Certified Registered"

## 2014-04-15 ENCOUNTER — Ambulatory Visit (HOSPITAL_BASED_OUTPATIENT_CLINIC_OR_DEPARTMENT_OTHER)
Admission: RE | Admit: 2014-04-15 | Discharge: 2014-04-15 | Disposition: A | Discharge status: Home / Self Care | Payer: 59 | Source: Ambulatory Visit | Attending: Orthopedic Surgery | Admitting: Orthopedic Surgery

## 2014-04-15 DIAGNOSIS — R011 Cardiac murmur, unspecified: Secondary | ICD-10-CM | POA: Insufficient documentation

## 2014-04-15 DIAGNOSIS — T8489XA Other specified complication of internal orthopedic prosthetic devices, implants and grafts, initial encounter: Secondary | ICD-10-CM | POA: Insufficient documentation

## 2014-04-15 DIAGNOSIS — Y831 Surgical operation with implant of artificial internal device as the cause of abnormal reaction of the patient, or of later complication, without mention of misadventure at the time of the procedure: Secondary | ICD-10-CM | POA: Insufficient documentation

## 2014-04-15 DIAGNOSIS — M19019 Primary osteoarthritis, unspecified shoulder: Secondary | ICD-10-CM | POA: Insufficient documentation

## 2014-04-15 DIAGNOSIS — T8484XA Pain due to internal orthopedic prosthetic devices, implants and grafts, initial encounter: Secondary | ICD-10-CM

## 2014-04-15 HISTORY — DX: Nausea with vomiting, unspecified: Z98.890

## 2014-04-15 HISTORY — DX: Presence of spectacles and contact lenses: Z97.3

## 2014-04-15 HISTORY — PX: HARDWARE REMOVAL: SHX979

## 2014-04-15 HISTORY — DX: Other specified postprocedural states: R11.2

## 2014-04-15 HISTORY — DX: Cardiac murmur, unspecified: R01.1

## 2014-04-15 LAB — POCT HEMOGLOBIN-HEMACUE: Hemoglobin: 12.5 g/dL (ref 12.0–15.0)

## 2014-04-15 SURGERY — REMOVAL, HARDWARE
Anesthesia: General | Site: Foot | Laterality: Right

## 2014-04-15 MED ORDER — CHLORHEXIDINE GLUCONATE 4 % EX LIQD: 60.0000 mL | Freq: Once | CUTANEOUS | Status: DC

## 2014-04-15 MED ORDER — FENTANYL CITRATE 0.05 MG/ML IJ SOLN
INTRAMUSCULAR | Status: AC
  Filled 2014-04-15: qty 6

## 2014-04-15 MED ORDER — 0.9 % SODIUM CHLORIDE (POUR BTL) OPTIME
TOPICAL | Status: DC | PRN
  Administered 2014-04-15: 300 mL

## 2014-04-15 MED ORDER — MIDAZOLAM HCL 2 MG/2ML IJ SOLN
INTRAMUSCULAR | Status: AC
  Filled 2014-04-15: qty 2

## 2014-04-15 MED ORDER — CEFAZOLIN SODIUM-DEXTROSE 2-3 GM-% IV SOLR
2.0000 g | INTRAVENOUS | Status: AC
  Administered 2014-04-15: 2 g via INTRAVENOUS

## 2014-04-15 MED ORDER — ONDANSETRON HCL 4 MG/2ML IJ SOLN
INTRAMUSCULAR | Status: DC | PRN
Start: 2014-04-15 — End: 2014-04-15
  Administered 2014-04-15: 4 mg via INTRAVENOUS

## 2014-04-15 MED ORDER — LACTATED RINGERS IV SOLN
INTRAVENOUS | Status: DC
  Administered 2014-04-15 (×2): via INTRAVENOUS

## 2014-04-15 MED ORDER — BUPIVACAINE HCL (PF) 0.5 % IJ SOLN
INTRAMUSCULAR | Status: AC
  Filled 2014-04-15: qty 30

## 2014-04-15 MED ORDER — BACITRACIN ZINC 500 UNIT/GM EX OINT
TOPICAL_OINTMENT | CUTANEOUS | Status: DC | PRN
  Administered 2014-04-15: 1 via TOPICAL

## 2014-04-15 MED ORDER — CEFAZOLIN SODIUM-DEXTROSE 2-3 GM-% IV SOLR
INTRAVENOUS | Status: AC
  Filled 2014-04-15: qty 50

## 2014-04-15 MED ORDER — LIDOCAINE HCL (CARDIAC) 20 MG/ML IV SOLN
INTRAVENOUS | Status: DC | PRN
  Administered 2014-04-15: 60 mg via INTRAVENOUS

## 2014-04-15 MED ORDER — FENTANYL CITRATE 0.05 MG/ML IJ SOLN: 50.0000 ug | INTRAMUSCULAR | Status: DC | PRN

## 2014-04-15 MED ORDER — BACITRACIN ZINC 500 UNIT/GM EX OINT
TOPICAL_OINTMENT | CUTANEOUS | Status: AC
  Filled 2014-04-15: qty 28.35

## 2014-04-15 MED ORDER — HYDROMORPHONE HCL PF 1 MG/ML IJ SOLN
INTRAMUSCULAR | Status: AC
  Filled 2014-04-15: qty 1

## 2014-04-15 MED ORDER — FENTANYL CITRATE 0.05 MG/ML IJ SOLN
INTRAMUSCULAR | Status: DC | PRN
  Administered 2014-04-15 (×2): 50 ug via INTRAVENOUS

## 2014-04-15 MED ORDER — HYDROMORPHONE HCL PF 1 MG/ML IJ SOLN
0.2500 mg | INTRAMUSCULAR | Status: DC | PRN
Start: 2014-04-15 — End: 2014-04-15
  Administered 2014-04-15: 0.5 mg via INTRAVENOUS

## 2014-04-15 MED ORDER — SODIUM CHLORIDE 0.9 % IV SOLN
INTRAVENOUS | Status: DC
Start: 2014-04-15 — End: 2014-04-15

## 2014-04-15 MED ORDER — MIDAZOLAM HCL 5 MG/5ML IJ SOLN
INTRAMUSCULAR | Status: DC | PRN
  Administered 2014-04-15: 2 mg via INTRAVENOUS

## 2014-04-15 MED ORDER — BUPIVACAINE-EPINEPHRINE 0.5% -1:200000 IJ SOLN
INTRAMUSCULAR | Status: DC | PRN
  Administered 2014-04-15: 4 mL

## 2014-04-15 MED ORDER — OXYCODONE HCL 5 MG PO TABS: 5.0000 mg | ORAL_TABLET | Freq: Once | ORAL | Status: DC | PRN

## 2014-04-15 MED ORDER — SUCCINYLCHOLINE CHLORIDE 20 MG/ML IJ SOLN
INTRAMUSCULAR | Status: AC
  Filled 2014-04-15: qty 1

## 2014-04-15 MED ORDER — ONDANSETRON HCL 4 MG/2ML IJ SOLN: 4.0000 mg | Freq: Once | INTRAMUSCULAR | Status: DC | PRN

## 2014-04-15 MED ORDER — MIDAZOLAM HCL 2 MG/2ML IJ SOLN: 1.0000 mg | INTRAMUSCULAR | Status: DC | PRN

## 2014-04-15 MED ORDER — DEXAMETHASONE SODIUM PHOSPHATE 10 MG/ML IJ SOLN
INTRAMUSCULAR | Status: DC | PRN
Start: 2014-04-15 — End: 2014-04-15
  Administered 2014-04-15: 10 mg via INTRAVENOUS

## 2014-04-15 MED ORDER — BUPIVACAINE-EPINEPHRINE (PF) 0.5% -1:200000 IJ SOLN
INTRAMUSCULAR | Status: AC
  Filled 2014-04-15: qty 30

## 2014-04-15 MED ORDER — OXYCODONE HCL 5 MG/5ML PO SOLN: 5.0000 mg | Freq: Once | ORAL | Status: DC | PRN

## 2014-04-15 MED ORDER — PROPOFOL 10 MG/ML IV BOLUS
INTRAVENOUS | Status: DC | PRN
Start: 2014-04-15 — End: 2014-04-15
  Administered 2014-04-15: 150 mg via INTRAVENOUS

## 2014-04-15 SURGICAL SUPPLY — 70 items
BAG DECANTER FOR FLEXI CONT (MISCELLANEOUS) IMPLANT
BANDAGE ELASTIC 4 VELCRO ST LF (GAUZE/BANDAGES/DRESSINGS) IMPLANT
BANDAGE ESMARK 6X9 LF (GAUZE/BANDAGES/DRESSINGS) IMPLANT
BLADE SURG 15 STRL LF DISP TIS (BLADE) ×2 IMPLANT
BLADE SURG 15 STRL SS (BLADE) ×4
BNDG COHESIVE 4X5 TAN STRL (GAUZE/BANDAGES/DRESSINGS) ×3 IMPLANT
BNDG COHESIVE 6X5 TAN STRL LF (GAUZE/BANDAGES/DRESSINGS) IMPLANT
BNDG ESMARK 4X9 LF (GAUZE/BANDAGES/DRESSINGS) IMPLANT
BNDG ESMARK 6X9 LF (GAUZE/BANDAGES/DRESSINGS)
CHLORAPREP W/TINT 26ML (MISCELLANEOUS) ×3 IMPLANT
CLOSURE WOUND 1/2 X4 (GAUZE/BANDAGES/DRESSINGS) ×1
COVER TABLE BACK 60X90 (DRAPES) ×3 IMPLANT
CUFF TOURNIQUET SINGLE 34IN LL (TOURNIQUET CUFF) IMPLANT
DECANTER SPIKE VIAL GLASS SM (MISCELLANEOUS) IMPLANT
DRAPE EXTREMITY T 121X128X90 (DRAPE) ×3 IMPLANT
DRAPE OEC MINIVIEW 54X84 (DRAPES) ×3 IMPLANT
DRAPE SURG 17X23 STRL (DRAPES) IMPLANT
DRAPE U-SHAPE 47X51 STRL (DRAPES) ×3 IMPLANT
DRSG EMULSION OIL 3X3 NADH (GAUZE/BANDAGES/DRESSINGS) ×3 IMPLANT
DRSG PAD ABDOMINAL 8X10 ST (GAUZE/BANDAGES/DRESSINGS) ×3 IMPLANT
DRSG TEGADERM 4X4.75 (GAUZE/BANDAGES/DRESSINGS) ×3 IMPLANT
ELECT REM PT RETURN 9FT ADLT (ELECTROSURGICAL) ×3
ELECTRODE REM PT RTRN 9FT ADLT (ELECTROSURGICAL) ×1 IMPLANT
GAUZE SPONGE 4X4 12PLY STRL (GAUZE/BANDAGES/DRESSINGS) ×3 IMPLANT
GLOVE BIOGEL PI IND STRL 7.0 (GLOVE) ×1 IMPLANT
GLOVE BIOGEL PI IND STRL 7.5 (GLOVE) ×1 IMPLANT
GLOVE BIOGEL PI IND STRL 8 (GLOVE) ×1 IMPLANT
GLOVE BIOGEL PI INDICATOR 7.0 (GLOVE) ×2
GLOVE BIOGEL PI INDICATOR 7.5 (GLOVE) ×2
GLOVE BIOGEL PI INDICATOR 8 (GLOVE) ×2
GLOVE EXAM NITRILE MD LF STRL (GLOVE) ×3 IMPLANT
GLOVE SURG SS PI 6.5 STRL IVOR (GLOVE) ×6 IMPLANT
GLOVE SURG SS PI 7.5 STRL IVOR (GLOVE) ×6 IMPLANT
GLOVE SURG SS PI 8.0 STRL IVOR (GLOVE) ×3 IMPLANT
GOWN STRL REUS W/ TWL LRG LVL3 (GOWN DISPOSABLE) ×2 IMPLANT
GOWN STRL REUS W/ TWL XL LVL3 (GOWN DISPOSABLE) ×1 IMPLANT
GOWN STRL REUS W/TWL LRG LVL3 (GOWN DISPOSABLE) ×4
GOWN STRL REUS W/TWL XL LVL3 (GOWN DISPOSABLE) ×2
NEEDLE HYPO 22GX1.5 SAFETY (NEEDLE) ×3 IMPLANT
PACK BASIN DAY SURGERY FS (CUSTOM PROCEDURE TRAY) ×3 IMPLANT
PAD CAST 4YDX4 CTTN HI CHSV (CAST SUPPLIES) ×1 IMPLANT
PADDING CAST ABS 4INX4YD NS (CAST SUPPLIES)
PADDING CAST ABS COTTON 4X4 ST (CAST SUPPLIES) IMPLANT
PADDING CAST COTTON 4X4 STRL (CAST SUPPLIES) ×2
PADDING CAST COTTON 6X4 STRL (CAST SUPPLIES) IMPLANT
PENCIL BUTTON HOLSTER BLD 10FT (ELECTRODE) IMPLANT
SANITIZER HAND PURELL 535ML FO (MISCELLANEOUS) ×3 IMPLANT
SHEET MEDIUM DRAPE 40X70 STRL (DRAPES) ×3 IMPLANT
SLEEVE SCD COMPRESS KNEE MED (MISCELLANEOUS) ×3 IMPLANT
SPLINT FAST PLASTER 5X30 (CAST SUPPLIES)
SPLINT PLASTER CAST FAST 5X30 (CAST SUPPLIES) IMPLANT
SPONGE LAP 18X18 X RAY DECT (DISPOSABLE) ×3 IMPLANT
STAPLER VISISTAT 35W (STAPLE) IMPLANT
STOCKINETTE 6  STRL (DRAPES) ×2
STOCKINETTE 6 STRL (DRAPES) ×1 IMPLANT
STRIP CLOSURE SKIN 1/2X4 (GAUZE/BANDAGES/DRESSINGS) ×2 IMPLANT
SUCTION FRAZIER TIP 10 FR DISP (SUCTIONS) IMPLANT
SUT ETHILON 3 0 PS 1 (SUTURE) ×3 IMPLANT
SUT MNCRL AB 3-0 PS2 18 (SUTURE) ×3 IMPLANT
SUT VIC AB 0 SH 27 (SUTURE) IMPLANT
SUT VIC AB 2-0 SH 27 (SUTURE)
SUT VIC AB 2-0 SH 27XBRD (SUTURE) IMPLANT
SUT VICRYL 4-0 PS2 18IN ABS (SUTURE) IMPLANT
SYR BULB 3OZ (MISCELLANEOUS) ×3 IMPLANT
SYR CONTROL 10ML LL (SYRINGE) ×3 IMPLANT
TOWEL OR 17X24 6PK STRL BLUE (TOWEL DISPOSABLE) ×6 IMPLANT
TOWEL OR NON WOVEN STRL DISP B (DISPOSABLE) IMPLANT
TUBE CONNECTING 20'X1/4 (TUBING)
TUBE CONNECTING 20X1/4 (TUBING) IMPLANT
UNDERPAD 30X30 INCONTINENT (UNDERPADS AND DIAPERS) ×3 IMPLANT

## 2014-04-15 NOTE — Anesthesia Postprocedure Evaluation (Signed)
  Anesthesia Post-op Note  Patient: Stacey Kelly  Procedure(s) Performed: Procedure(s): EXCISION OF RIGHT FOOT DORSAL HARDWARE (Right)  Patient Location: PACU  Anesthesia Type:General  Level of Consciousness: awake, alert  and oriented  Airway and Oxygen Therapy: Patient Spontanous Breathing  Post-op Pain: mild  Post-op Assessment: Post-op Vital signs reviewed  Post-op Vital Signs: Reviewed  Last Vitals:  Filed Vitals:   04/15/14 0938  BP: 123/57  Pulse: 76  Temp: 36.4 C  Resp: 16    Complications: No apparent anesthesia complications

## 2014-04-15 NOTE — Transfer of Care (Signed)
Immediate Anesthesia Transfer of Care Note  Patient: Stacey Kelly  Procedure(s) Performed: Procedure(s): EXCISION OF RIGHT FOOT DORSAL HARDWARE (Right)  Patient Location: PACU  Anesthesia Type:General  Level of Consciousness: awake and patient cooperative  Airway & Oxygen Therapy: Patient Spontanous Breathing and Patient connected to face mask oxygen  Post-op Assessment: Report given to PACU RN and Post -op Vital signs reviewed and stable  Post vital signs: Reviewed and stable  Complications: No apparent anesthesia complications

## 2014-04-15 NOTE — Discharge Instructions (Signed)
Stacey Simmer, MD [place]  Please read the following information regarding your care after surgery.  Medications  You only need a prescription for the narcotic pain medicine (ex. oxycodone, Percocet, Norco).  All of the other medicines listed below are available over the counter. X acetominophen (Tylenol) 650 mg every 4-6 hours as you need for minor pain  Weight Bearing X Bear weight when you are able on your operated leg or foot. ? Bear weight only on the heel of your operated foot in the post-op shoe. ? Do not bear any weight on the operated leg or foot.  Cast / Splint / Dressing ? Keep your splint or cast clean and dry.  Dont put anything (coat hanger, pencil, etc) down inside of it.  If it gets damp, use a hair dryer on the cool setting to dry it.  If it gets soaked, call the office to schedule an appointment for a cast change. X Remove your dressing 3 days after surgery and cover the incisions with dry dressings.    After your dressing, cast or splint is removed; you may shower, but do not soak or scrub the wound.  Allow the water to run over it, and then gently pat it dry.  Swelling It is normal for you to have swelling where you had surgery.  To reduce swelling and pain, keep your toes above your nose for at least 3 days after surgery.  It may be necessary to keep your foot or leg elevated for several weeks.  If it hurts, it should be elevated.  Follow Up Call my office at [phone] when you are discharged from the hospital or surgery center to schedule an appointment to be seen two weeks after surgery.  Call my office at [phone] if you develop a fever >101.5 F, nausea, vomiting, bleeding from the surgical site or severe pain.      Post Anesthesia Home Care Instructions  Activity: Get plenty of rest for the remainder of the day. A responsible adult should stay with you for 24 hours following the procedure.  For the next 24 hours, DO NOT: -Drive a  car -Paediatric nurse -Drink alcoholic beverages -Take any medication unless instructed by your physician -Make any legal decisions or sign important papers.  Meals: Start with liquid foods such as gelatin or soup. Progress to regular foods as tolerated. Avoid greasy, spicy, heavy foods. If nausea and/or vomiting occur, drink only clear liquids until the nausea and/or vomiting subsides. Call your physician if vomiting continues.  Special Instructions/Symptoms: Your throat may feel dry or sore from the anesthesia or the breathing tube placed in your throat during surgery. If this causes discomfort, gargle with warm salt water. The discomfort should disappear within 24 hours.

## 2014-04-15 NOTE — Op Note (Signed)
NAMESIMCHA, FARRINGTON NO.:  [id number]  MEDICAL RECORD NO.:  [id number]  LOCATION:                                 FACILITY:  PHYSICIAN:  Wylene Simmer, MD        DATE OF BIRTH:  01-06-1956  DATE OF PROCEDURE:  04/15/2014 DATE OF DISCHARGE:                              OPERATIVE REPORT   PREOPERATIVE DIAGNOSIS:  Painful hardware, right dorsal foot.  POSTOPERATIVE DIAGNOSIS:  Painful hardware, right dorsal foot.  PROCEDURE: 1. Removal of deep implant from the right foot. 2. AP and lateral radiographs of the right foot.  SURGEON:  Wylene Simmer, MD  ANESTHESIA:  General.  ESTIMATED BLOOD LOSS:  Minimal.  TOURNIQUET TIME:  17 minutes with an ankle Esmarch.  COMPLICATIONS:  None apparent.  DISPOSITION:  Extubated, awake and stable to recovery.  INDICATIONS FOR PROCEDURE:  The patient is a 58 year old woman with past medical history significant for bunion correction in the remote past. She has developed pain at the site of the pins used to fix the osteotomy.  She desires removal of these pins.  She understands the risks and benefits, the alternative treatment options and elects surgical treatment.  She specifically understands risks of bleeding, infection, nerve damage, blood clots, need for additional surgery, amputation, and death.  PROCEDURE IN DETAIL:  After preoperative consent was obtained and the correct operative site was identified, the patient was brought to the operating room and placed supine on the operating table.  General anesthesia was induced.  Preoperative antibiotics were administered. Surgical time-out was taken.  The right lower extremity was prepped and draped in standard sterile fashion.  The foot was exsanguinated and a 4- inch Esmarch tourniquet was wrapped about the ankle.  The patient's painful pin was identified by palpation over the neck of the metatarsal. The previous surgical incision was sharply incised over the side of  the pin.  Sharp dissection was carried down through the subcutaneous tissue and the pin was identified.  A needle driver was used to remove it its entirety.  The second pin was identified.  It was more densely adherent to the underlying bone.  A small osteotome was used to break the pin free from the bone.  The pin was then removed with the needle driver. AP and lateral radiographs confirmed complete removal of both pins.  The wound was irrigated.  Subcutaneous tissue was approximated with a simple suture of 3-0 Monocryl.  Steri-Strips was applied to close the skin and sterile dressings were applied followed by compression wrap.  Tourniquet was released at 17 minutes after application of the dressings.  The patient was awakened from anesthesia and transported to recovery room in stable condition.  FOLLOWUP PLAN:  The patient will be weightbearing as tolerated on her right foot in a flat postop shoe.  She will resume regular activities as she tolerates.  RADIOGRAPHS:  AP and lateral radiographs of the foot are obtained intraoperatively today.  The right foot show interval removal of 2 pins at the neck of the first metatarsal.  No acute injury is noted. Incidental note is made of a screw in the fifth metatarsal from  previous bunionectomy correction.     Wylene Simmer, MD     JH/MEDQ  D:  04/15/2014  T:  04/15/2014  Job:  [id number]

## 2014-04-15 NOTE — Anesthesia Preprocedure Evaluation (Addendum)
Anesthesia Evaluation  Patient identified by MRN, date of birth, ID band Patient awake    Reviewed: Allergy & Precautions, NPO status   History of Anesthesia Complications (+) PONV  Airway Mallampati: I TM Distance: >3 FB Neck ROM: Full    Dental  (+) Teeth Intact, Dental Advisory Given   Pulmonary  breath sounds clear to auscultation        Cardiovascular Valvular problems/murmurs: Pt reports small mumur which I could not hear today. Rhythm:Regular Rate:Normal     Neuro/Psych    GI/Hepatic   Endo/Other    Renal/GU      Musculoskeletal   Abdominal   Peds  Hematology   Anesthesia Other Findings   Reproductive/Obstetrics                          Anesthesia Physical Anesthesia Plan  ASA: I  Anesthesia Plan: General   Post-op Pain Management:    Induction: Intravenous  Airway Management Planned: LMA  Additional Equipment:   Intra-op Plan:   Post-operative Plan: Extubation in OR  Informed Consent: I have reviewed the patients History and Physical, chart, labs and discussed the procedure including the risks, benefits and alternatives for the proposed anesthesia with the patient or authorized representative who has indicated his/her understanding and acceptance.   Dental advisory given  Plan Discussed with: CRNA, Anesthesiologist and Surgeon  Anesthesia Plan Comments: (Pt had Left rotator cuff surgery and needs her L arm kept in a sling during her operation today.)       Anesthesia Quick Evaluation

## 2014-04-15 NOTE — Anesthesia Procedure Notes (Signed)
Procedure Name: LMA Insertion Date/Time: 04/15/2014 7:30 AM Performed by: BLOCKER, TIMOTHY Pre-anesthesia Checklist: Patient identified, Emergency Drugs available, Suction available and Patient being monitored Patient Re-evaluated:Patient Re-evaluated prior to inductionOxygen Delivery Method: Circle System Utilized Preoxygenation: Pre-oxygenation with 100% oxygen Intubation Type: IV induction Ventilation: Mask ventilation without difficulty LMA: LMA inserted LMA Size: 4.0 Number of attempts: 1 Airway Equipment and Method: bite block Placement Confirmation: positive ETCO2 Tube secured with: Tape Dental Injury: Teeth and Oropharynx as per pre-operative assessment

## 2014-04-15 NOTE — H&P (Signed)
Stacey Kelly is an 58 y.o. female.   Chief Complaint:  right foot painful hardware HPI: 58 y/o female with right foot painful hardware s/p bunion correction in the remote past.  Past Medical History  Diagnosis Date  . OA (osteoarthritis)     LEFT SHOULDER AC JOINT  . Left rotator cuff tear   . PONV (postoperative nausea and vomiting)   . Heart murmur     ASYMPTOMATIC--  ECHO  1995  MILD REGURG  . Wears glasses     Past Surgical History  Procedure Laterality Date  . Right shoulder surgery  X3  LAST ONE 2010    INCLUDING ROTATOR CUFF REPAIR /  RECONSTRUCTION  . Bunionectomy Right 2004    BOTH SIDES OF FOOT  . Tonsillectomy  AGE 69  . Shoulder arthroscopy with rotator cuff repair Left 03/18/2014    Procedure: LEFT SHOULDER ARTHROSCOPY WITH EVALUATION UNDER ANESTHESIA DEBRIDEMENT SUBACROMIAL DECOMPRESSION DISTAL CLAVICAL RESECTION ROTATOR CUFF REPAIR BICEPS  TENDINOSIS;  Surgeon: Sydnee Cabal, MD;  Location: [facility];  Service: Orthopedics;  Laterality: Left;    History reviewed. No pertinent family history. Social History:  reports that she has never smoked. She has never used smokeless tobacco. She reports that she does not drink alcohol or use illicit drugs.  Allergies:  Allergies  Allergen Reactions  . Latex Anaphylaxis    Medications Prior to Admission  Medication Sig Dispense Refill  . Calcium Carb-Cholecalciferol (CALCIUM 600 + D PO) Take 1 tablet by mouth 2 (two) times daily.      . cetirizine (ZYRTEC) 10 MG tablet Take 10 mg by mouth every evening.      . Cholecalciferol (VITAMIN D3) 2000 UNITS TABS Take 1 capsule by mouth daily.      . cyclobenzaprine (FLEXERIL) 10 MG tablet Take 10 mg by mouth at bedtime.      . diclofenac sodium (VOLTAREN) 1 % GEL Apply topically 4 (four) times daily as needed.      . fluticasone (FLONASE) 50 MCG/ACT nasal spray Place 1 spray into both nostrils 2 (two) times daily.      . Multiple Vitamin (MULTIVITAMIN) tablet  Take 1 tablet by mouth daily.      Marland Kitchen oxyCODONE-acetaminophen (PERCOCET/ROXICET) 5-325 MG per tablet Take by mouth every 4 (four) hours as needed for severe pain.        No results found for this or any previous visit (from the past 48 hour(s)). No results found.  ROS  No recent f/c/n/v/wt loss  Blood pressure 115/75, pulse 78, temperature 98.1 F (36.7 C), temperature source Oral, resp. rate 20, height 5' 5.5" (1.664 m), weight 68.947 kg (152 lb), SpO2 98.00%. Physical Exam  wn wd woman in nad.  A and O x 4.  Mood and affect normal.  EOMI.  Resp unlabored.  Right foot with healthy and healed incision dorsally.  No lymphadenopathy.  DP and PT pulses 2+.  5/5 strength in pF and DF of the toes.  Sens to LT intact.    Assessment/Plan Painful hardware right foot - to OR for removal of deep implant.  The risks and benefits of the alternative treatment options have been discussed in detail.  The patient wishes to proceed with surgery and specifically understands risks of bleeding, infection, nerve damage, blood clots, need for additional surgery, amputation and death.   Wylene Simmer 05/04/2014, 7:26 AM

## 2014-04-15 NOTE — Brief Op Note (Signed)
04/15/2014  8:20 AM  PATIENT:  Stacey Kelly  58 y.o. female  PRE-OPERATIVE DIAGNOSIS:  Right foot painful hardware  POST-OPERATIVE DIAGNOSIS:  Right foot painful hardware  Procedure(s): 1.  Removal of deep implant from the right foot 2.  AP and lateral xrays of the right foot  SURGEON:  Wylene Simmer, MD  ASSISTANT: n/a  ANESTHESIA:   General  EBL:  minimal   TOURNIQUET:   Total Tourniquet Time Documented: Leg (Right) - 17 minutes Total: Leg (Right) - 17 minutes   COMPLICATIONS:  None apparent  DISPOSITION:  Extubated, awake and stable to recovery.  DICTATION ID:  [id number]

## 2014-04-16 ENCOUNTER — Ambulatory Visit: Payer: 59 | Admitting: Physical Therapy

## 2014-04-16 ENCOUNTER — Encounter (HOSPITAL_BASED_OUTPATIENT_CLINIC_OR_DEPARTMENT_OTHER): Payer: Self-pay | Admitting: Orthopedic Surgery

## 2014-04-19 ENCOUNTER — Ambulatory Visit: Payer: 59 | Admitting: Physical Therapy

## 2014-04-22 ENCOUNTER — Ambulatory Visit: Payer: 59 | Admitting: Physical Therapy

## 2014-04-26 ENCOUNTER — Ambulatory Visit: Payer: 59 | Admitting: Physical Therapy

## 2014-04-27 ENCOUNTER — Encounter: Payer: 59 | Admitting: Physical Therapy

## 2014-05-03 ENCOUNTER — Ambulatory Visit: Payer: 59 | Attending: Specialist | Admitting: Physical Therapy

## 2014-05-03 DIAGNOSIS — M25619 Stiffness of unspecified shoulder, not elsewhere classified: Secondary | ICD-10-CM | POA: Insufficient documentation

## 2014-05-03 DIAGNOSIS — IMO0001 Reserved for inherently not codable concepts without codable children: Secondary | ICD-10-CM | POA: Insufficient documentation

## 2014-05-03 DIAGNOSIS — M25519 Pain in unspecified shoulder: Secondary | ICD-10-CM | POA: Insufficient documentation

## 2014-05-03 DIAGNOSIS — R609 Edema, unspecified: Secondary | ICD-10-CM | POA: Insufficient documentation

## 2014-05-10 ENCOUNTER — Ambulatory Visit: Payer: 59 | Admitting: Physical Therapy

## 2014-05-17 ENCOUNTER — Ambulatory Visit: Payer: 59 | Admitting: Physical Therapy

## 2014-05-24 ENCOUNTER — Ambulatory Visit: Payer: 59 | Admitting: Physical Therapy

## 2014-05-31 ENCOUNTER — Ambulatory Visit: Payer: 59 | Attending: Specialist | Admitting: Physical Therapy

## 2014-05-31 DIAGNOSIS — M25519 Pain in unspecified shoulder: Secondary | ICD-10-CM | POA: Insufficient documentation

## 2014-05-31 DIAGNOSIS — IMO0001 Reserved for inherently not codable concepts without codable children: Secondary | ICD-10-CM | POA: Insufficient documentation

## 2014-05-31 DIAGNOSIS — M25619 Stiffness of unspecified shoulder, not elsewhere classified: Secondary | ICD-10-CM | POA: Diagnosis not present

## 2014-05-31 DIAGNOSIS — R609 Edema, unspecified: Secondary | ICD-10-CM | POA: Diagnosis not present

## 2014-06-07 ENCOUNTER — Ambulatory Visit: Payer: 59 | Admitting: Physical Therapy

## 2014-06-14 ENCOUNTER — Ambulatory Visit: Payer: 59 | Admitting: Physical Therapy

## 2014-06-14 DIAGNOSIS — IMO0001 Reserved for inherently not codable concepts without codable children: Secondary | ICD-10-CM | POA: Diagnosis not present

## 2014-06-21 ENCOUNTER — Ambulatory Visit: Payer: 59 | Admitting: Physical Therapy

## 2014-06-21 DIAGNOSIS — IMO0001 Reserved for inherently not codable concepts without codable children: Secondary | ICD-10-CM | POA: Diagnosis not present

## 2014-06-28 ENCOUNTER — Ambulatory Visit: Payer: 59 | Admitting: Physical Therapy

## 2014-06-28 DIAGNOSIS — IMO0001 Reserved for inherently not codable concepts without codable children: Secondary | ICD-10-CM | POA: Diagnosis not present

## 2014-07-08 ENCOUNTER — Ambulatory Visit: Payer: 59 | Attending: Specialist | Admitting: Physical Therapy

## 2014-07-08 DIAGNOSIS — M25619 Stiffness of unspecified shoulder, not elsewhere classified: Secondary | ICD-10-CM | POA: Diagnosis not present

## 2014-07-08 DIAGNOSIS — R609 Edema, unspecified: Secondary | ICD-10-CM | POA: Diagnosis not present

## 2014-07-08 DIAGNOSIS — M25519 Pain in unspecified shoulder: Secondary | ICD-10-CM | POA: Insufficient documentation

## 2014-07-08 DIAGNOSIS — IMO0001 Reserved for inherently not codable concepts without codable children: Secondary | ICD-10-CM | POA: Diagnosis not present

## 2014-07-13 ENCOUNTER — Ambulatory Visit: Payer: 59 | Admitting: Physical Therapy

## 2014-07-13 DIAGNOSIS — IMO0001 Reserved for inherently not codable concepts without codable children: Secondary | ICD-10-CM | POA: Diagnosis not present

## 2014-07-19 ENCOUNTER — Ambulatory Visit: Payer: 59 | Admitting: Physical Therapy

## 2014-07-26 ENCOUNTER — Ambulatory Visit: Payer: 59 | Admitting: Physical Therapy

## 2014-07-26 DIAGNOSIS — IMO0001 Reserved for inherently not codable concepts without codable children: Secondary | ICD-10-CM | POA: Diagnosis not present

## 2014-08-18 ENCOUNTER — Ambulatory Visit: Payer: 59 | Admitting: Physical Therapy

## 2014-08-18 ENCOUNTER — Ambulatory Visit: Payer: 59 | Attending: Specialist | Admitting: Physical Therapy

## 2014-08-18 DIAGNOSIS — M25612 Stiffness of left shoulder, not elsewhere classified: Secondary | ICD-10-CM | POA: Diagnosis not present

## 2014-08-18 DIAGNOSIS — Z9889 Other specified postprocedural states: Secondary | ICD-10-CM | POA: Diagnosis not present

## 2014-08-18 DIAGNOSIS — M25512 Pain in left shoulder: Secondary | ICD-10-CM | POA: Insufficient documentation

## 2014-08-18 DIAGNOSIS — R609 Edema, unspecified: Secondary | ICD-10-CM | POA: Insufficient documentation

## 2015-03-24 ENCOUNTER — Ambulatory Visit: Payer: Worker's Compensation | Admitting: Physical Therapy

## 2015-03-30 ENCOUNTER — Encounter: Payer: Self-pay | Admitting: Physical Therapy

## 2015-03-30 ENCOUNTER — Ambulatory Visit: Payer: PRIVATE HEALTH INSURANCE | Attending: Specialist | Admitting: Physical Therapy

## 2015-03-30 DIAGNOSIS — M25512 Pain in left shoulder: Secondary | ICD-10-CM | POA: Insufficient documentation

## 2015-03-30 DIAGNOSIS — M542 Cervicalgia: Secondary | ICD-10-CM | POA: Insufficient documentation

## 2015-03-30 DIAGNOSIS — Z4789 Encounter for other orthopedic aftercare: Secondary | ICD-10-CM | POA: Diagnosis not present

## 2015-03-30 NOTE — Therapy (Signed)
[place], [place], [address] Phone: [phone]   Fax:  [phone]  Physical Therapy Evaluation  Patient Details  Name: Stacey Kelly MRN: [id number] Date of Birth: 09/10/1956 Referring Provider:  Sydnee Cabal, MD  Encounter Date: 03/30/2015      PT End of Session - 03/30/15 0829    Visit Number 1   Date for PT Re-Evaluation 05/30/15   PT Start Time 0800   PT Stop Time 0900   PT Time Calculation (min) 60 min   Behavior During Therapy [facility] for tasks assessed/performed      Past Medical History  Diagnosis Date  . OA (osteoarthritis)     LEFT SHOULDER AC JOINT  . Left rotator cuff tear   . PONV (postoperative nausea and vomiting)   . Heart murmur     ASYMPTOMATIC--  ECHO  1995  MILD REGURG  . Wears glasses     Past Surgical History  Procedure Laterality Date  . Right shoulder surgery  X3  LAST ONE 2010    INCLUDING ROTATOR CUFF REPAIR /  RECONSTRUCTION  . Bunionectomy Right 2004    BOTH SIDES OF FOOT  . Tonsillectomy  AGE 59  . Shoulder arthroscopy with rotator cuff repair Left 03/18/2014    Procedure: LEFT SHOULDER ARTHROSCOPY WITH EVALUATION UNDER ANESTHESIA DEBRIDEMENT SUBACROMIAL DECOMPRESSION DISTAL CLAVICAL RESECTION ROTATOR CUFF REPAIR BICEPS  TENDINOSIS;  Surgeon: Sydnee Cabal, MD;  Location: [facility];  Service: Orthopedics;  Laterality: Left;  . Hardware removal Right 04/15/2014    Procedure: EXCISION OF RIGHT FOOT DORSAL HARDWARE;  Surgeon: Wylene Simmer, MD;  Location: [facility];  Service: Orthopedics;  Laterality: Right;    There were no vitals filed for this visit.  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     Limitations Reading;Lifting;House hold activities   Diagnostic tests x-ray narrowing of C5-6   Currently in Pain? Yes   Pain Score 3    Pain Location Arm   Pain Orientation Left   Pain Descriptors / Indicators Aching;Burning   Pain Type Acute pain   Pain Onset 1 to 4 weeks ago   Pain Frequency Constant   Aggravating Factors  Sleep, reaching back, any lifting, up to 6/10   Pain Relieving Factors prednisone and rest help   Effect of Pain on Daily Activities light duty at work now            [facility] PT Assessment - 03/30/15 0001    Assessment   Medical Diagnosis cervical DDD, left RC strain   Onset Date/Surgical Date 03/04/15   Prior Therapy for left shoulder RC repair about a year ago   Precautions   Precautions None   Balance Screen   Has the patient fallen in the past 6 months No   Has the patient had a decrease in activity level because of a fear of falling?  No   Is the patient reluctant to leave their home because of a fear of falling?  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   Leisure goes to the gym 2x/week   Posture/Postural Control   Posture Comments mild forward head and rounded shoulders   AROM   Overall AROM  --  Cervical AROM is decreased 50% for SB and rotation,other [facility]   Left Shoulder Flexion 150 Degrees   Left Shoulder ABduction 150 Degrees   Left Shoulder Internal Rotation 50 Degrees   Left Shoulder External Rotation 85 Degrees   Strength   Overall Strength Comments Left shoulder Flexion and abduction 3+/5, ER/IR 4-/5   Palpation   Palpation comment some tenderness in the left rhomboid and anterior left shoulder, some tightness in the left upper trap   Special Tests    Special Tests Rotator Cuff Impingement   Rotator Cuff Impingment tests Neer  impingement test;Empty Can test   Neer Impingement test    Findings Negative   Empty Can test   Findings Positive                   [facility] Adult PT Treatment/Exercise - 03/30/15 0001    Modalities   Modalities Electrical Stimulation;Traction   Electrical Stimulation   Electrical Stimulation Location neck and left shoulder   Electrical Stimulation Action IFC   Electrical Stimulation Parameters to tolerance   Electrical Stimulation Goals Pain   Traction   Type of Traction Cervical   Min (lbs) 12   Hold Time static   Time 15                  PT Short Term Goals - 03/30/15 0833    PT SHORT TERM GOAL #1   Title independent with initial HEP   Time 2   Period Weeks   Status New           PT Long Term Goals - 03/30/15 6811    PT LONG TERM GOAL #1   Title decrease pain 50%   Time 8   Period Weeks   Status New   PT LONG TERM GOAL #2   Title increase cervical ROM of Side bending and rotation 25%   Time 8   Period Weeks   Status New   PT LONG TERM GOAL #3   Title increase left shoulder abduction strength to 4-/5   Time 8   Period Weeks   Status New   PT LONG TERM GOAL #4   Title sleep without pain down the arm   Time 8   Period Weeks   Status New               Plan - 03/30/15 5726    Clinical Impression Statement Patient with injury while moving a patient on 03/04/15.  she has impingement of nerve at C5-6, has a previous left RC repair that was not at full strength.  Has decreased IR and very weak flexion and abduction, also a +  empty can sign   Pt will benefit from skilled therapeutic intervention in order to improve on the following deficits Decreased strength;Decreased range of motion;Increased muscle spasms;Pain   Rehab Potential Good   PT Frequency 2x / week   PT Duration 8 weeks   PT Treatment/Interventions Electrical Stimulation;Cryotherapy;Moist Heat;Traction;Iontophoresis 4mg /ml Dexamethasone;Ultrasound;Therapeutic  exercise;Patient/family education;Manual techniques   PT Next Visit Plan See if traction helped, assure RC isometric HEP   Consulted and Agree with Plan of Care Patient  Problem List Patient Active Problem List   Diagnosis Date Noted  . S/P rotator cuff repair 03/18/2014    Sumner Boast., PT 03/30/2015, 8:42 AM  [place] [address] [address] [place], [place], [address] Phone: [phone]   Fax:  [phone]

## 2015-04-05 ENCOUNTER — Encounter: Payer: Self-pay | Admitting: Physical Therapy

## 2015-04-05 ENCOUNTER — Ambulatory Visit: Payer: PRIVATE HEALTH INSURANCE | Attending: Specialist | Admitting: Physical Therapy

## 2015-04-05 DIAGNOSIS — M25512 Pain in left shoulder: Secondary | ICD-10-CM | POA: Diagnosis not present

## 2015-04-05 DIAGNOSIS — Z4789 Encounter for other orthopedic aftercare: Secondary | ICD-10-CM | POA: Diagnosis not present

## 2015-04-05 DIAGNOSIS — M542 Cervicalgia: Secondary | ICD-10-CM

## 2015-04-05 NOTE — Therapy (Signed)
[place], [place], [address] Phone: [phone]   Fax:  [phone]  Physical Therapy Treatment  Patient Details  Name: Stacey Kelly MRN: [id number] Date of Birth: 11-16-1955 Referring Provider:  Sydnee Cabal, MD  Encounter Date: 04/05/2015      PT End of Session - 04/05/15 0835    Visit Number 2   PT Start Time 0802   PT Stop Time 0900   PT Time Calculation (min) 58 min   Activity Tolerance Patient tolerated treatment well      Past Medical History  Diagnosis Date  . OA (osteoarthritis)     LEFT SHOULDER AC JOINT  . Left rotator cuff tear   . PONV (postoperative nausea and vomiting)   . Heart murmur     ASYMPTOMATIC--  ECHO  1995  MILD REGURG  . Wears glasses     Past Surgical History  Procedure Laterality Date  . Right shoulder surgery  X3  LAST ONE 2010    INCLUDING ROTATOR CUFF REPAIR /  RECONSTRUCTION  . Bunionectomy Right 2004    BOTH SIDES OF FOOT  . Tonsillectomy  AGE 59  . Shoulder arthroscopy with rotator cuff repair Left 03/18/2014    Procedure: LEFT SHOULDER ARTHROSCOPY WITH EVALUATION UNDER ANESTHESIA DEBRIDEMENT SUBACROMIAL DECOMPRESSION DISTAL CLAVICAL RESECTION ROTATOR CUFF REPAIR BICEPS  TENDINOSIS;  Surgeon: Sydnee Cabal, MD;  Location: [facility];  Service: Orthopedics;  Laterality: Left;  . Hardware removal Right 04/15/2014    Procedure: EXCISION OF RIGHT FOOT DORSAL HARDWARE;  Surgeon: Wylene Simmer, MD;  Location: [facility];  Service: Orthopedics;  Laterality: Right;    There were no vitals filed for this visit.  Visit Diagnosis:  Neck pain  Left shoulder pain      Subjective Assessment - 04/05/15 0806    Subjective I have slept better, less numbness.  Still hurts right in the front of the left shoulder.   Currently in Pain? Yes   Pain Score 3    Pain Location Arm   Pain Orientation Left   Pain Descriptors / Indicators  Aching;Burning                         [facility] Adult PT Treatment/Exercise - 04/05/15 0001    Neck Exercises: Machines for Strengthening   UBE (Upper Arm Bike) Level 4 x 4 minutes   Cybex Row 20# 2x15   Other Machines for Strengthening lats 20# 2x15   Other Machines for Strengthening 5# serratus push   Shoulder Exercises: ROM/Strengthening   Rhythmic Stabilization, Supine standing with weighted ball, two hands and one hand   Modalities   Modalities Ultrasound   Electrical Stimulation   Electrical Stimulation Location neck and left shoulder   Electrical Stimulation Action IFC   Electrical Stimulation Parameters to tolerance   Electrical Stimulation Goals Pain   Ultrasound   Ultrasound Location anterior left shoulder   Ultrasound Parameters 100% 1MHz   Ultrasound Goals Pain   Traction   Type of Traction Cervical   Min (lbs) 12   Hold Time static   Time 15                  PT Short Term Goals - 03/30/15 0623    PT SHORT TERM GOAL #1   Title independent with initial HEP   Time 2   Period Weeks   Status New  PT Long Term Goals - 03/30/15 8588    PT LONG TERM GOAL #1   Title decrease pain 50%   Time 8   Period Weeks   Status New   PT LONG TERM GOAL #2   Title increase cervical ROM of Side bending and rotation 25%   Time 8   Period Weeks   Status New   PT LONG TERM GOAL #3   Title increase left shoulder abduction strength to 4-/5   Time 8   Period Weeks   Status New   PT LONG TERM GOAL #4   Title sleep without pain down the arm   Time 8   Period Weeks   Status New               Plan - 04/05/15 5027    Clinical Impression Statement Pain in left shoulder with abduction.  Very weak with ER and abduction   PT Next Visit Plan Continue with plan   Consulted and Agree with Plan of Care Patient        Problem List Patient Active Problem List   Diagnosis Date Noted  . S/P rotator cuff repair 03/18/2014     Sumner Boast., PT 04/05/2015, 8:39 AM  [place] [address] [place], [place], [address] Phone: [phone]   Fax:  [phone]

## 2015-04-08 ENCOUNTER — Encounter: Payer: Self-pay | Admitting: Physical Therapy

## 2015-04-08 ENCOUNTER — Ambulatory Visit: Payer: PRIVATE HEALTH INSURANCE | Attending: Specialist | Admitting: Physical Therapy

## 2015-04-08 DIAGNOSIS — M542 Cervicalgia: Secondary | ICD-10-CM

## 2015-04-08 DIAGNOSIS — M25512 Pain in left shoulder: Secondary | ICD-10-CM

## 2015-04-08 DIAGNOSIS — Z4789 Encounter for other orthopedic aftercare: Secondary | ICD-10-CM | POA: Diagnosis not present

## 2015-04-08 DIAGNOSIS — R531 Weakness: Secondary | ICD-10-CM | POA: Diagnosis not present

## 2015-04-08 NOTE — Therapy (Signed)
[place], [place], [address] Phone: [phone]   Fax:  [phone]  Physical Therapy Treatment  Patient Details  Name: Stacey Kelly MRN: [id number] Date of Birth: 14-Jun-1956 Referring Provider:  Sydnee Cabal, MD  Encounter Date: 04/08/2015      PT End of Session - 04/08/15 0832    Visit Number 3   PT Start Time 0800   PT Stop Time 0858   PT Time Calculation (min) 58 min   Activity Tolerance Patient tolerated treatment well      Past Medical History  Diagnosis Date  . OA (osteoarthritis)     LEFT SHOULDER AC JOINT  . Left rotator cuff tear   . PONV (postoperative nausea and vomiting)   . Heart murmur     ASYMPTOMATIC--  ECHO  1995  MILD REGURG  . Wears glasses     Past Surgical History  Procedure Laterality Date  . Right shoulder surgery  X3  LAST ONE 2010    INCLUDING ROTATOR CUFF REPAIR /  RECONSTRUCTION  . Bunionectomy Right 2004    BOTH SIDES OF FOOT  . Tonsillectomy  AGE 59  . Shoulder arthroscopy with rotator cuff repair Left 03/18/2014    Procedure: LEFT SHOULDER ARTHROSCOPY WITH EVALUATION UNDER ANESTHESIA DEBRIDEMENT SUBACROMIAL DECOMPRESSION DISTAL CLAVICAL RESECTION ROTATOR CUFF REPAIR BICEPS  TENDINOSIS;  Surgeon: Sydnee Cabal, MD;  Location: [facility];  Service: Orthopedics;  Laterality: Left;  . Hardware removal Right 04/15/2014    Procedure: EXCISION OF RIGHT FOOT DORSAL HARDWARE;  Surgeon: Wylene Simmer, MD;  Location: [facility];  Service: Orthopedics;  Laterality: Right;    There were no vitals filed for this visit.  Visit Diagnosis:  Neck pain  Left shoulder pain      Subjective Assessment - 04/08/15 0759    Subjective I had some pain in the right shoulder, seems like the pain starts in the shoulder blade area of the left shoulder   Currently in Pain? Yes   Pain Score 2    Pain Location Arm   Pain Orientation Left    Aggravating Factors  less pain iwth sleeping this week.  still hurts reaching up and back            [facility] PT Assessment - 04/08/15 0001    AROM   Left Shoulder Internal Rotation 55 Degrees                     [facility] Adult PT Treatment/Exercise - 04/08/15 0001    Neck Exercises: Machines for Strengthening   UBE (Upper Arm Bike) Level 4 x 4 minutes   Cybex Row 20# 2x15   Power Tower 10# scapstabilization 2 ways   Primary school teacher for Strengthening lats 20# 2x15   Other Machines for Strengthening 5# serratus push   Shoulder Exercises: ROM/Strengthening   "W" Arms red tband   Rhythmic Stabilization, Supine standing with weighted ball, two hands and one hand, isometric overhead ball carry   Electrical Stimulation   Electrical Stimulation Location neck and left shoulder   Electrical Stimulation Action IFC   Electrical Stimulation Parameters tolerance   Electrical Stimulation Goals Pain   Traction   Type of Traction Cervical   Min (lbs) 12   Hold Time static   Time 15                  PT Short Term Goals - 03/30/15  0833    PT SHORT TERM GOAL #1   Title independent with initial HEP   Time 2   Period Weeks   Status New           PT Long Term Goals - 04/08/15 0836    PT LONG TERM GOAL #1   Title decrease pain 50%   Status On-going   PT LONG TERM GOAL #2   Title increase cervical ROM of Side bending and rotation 25%   Status On-going               Plan - 04/08/15 8786    Clinical Impression Statement Weak abduction, fatigues easily, reporting less pain with sleep.   PT Next Visit Plan work on abduction strength   Consulted and Agree with Plan of Care Patient        Problem List Patient Active Problem List   Diagnosis Date Noted  . S/P rotator cuff repair 03/18/2014    Sumner Boast., PT 04/08/2015, 8:37 AM  [place] [address] [place], [place],  [address] Phone: [phone]   Fax:  [phone]

## 2015-04-12 ENCOUNTER — Ambulatory Visit: Payer: PRIVATE HEALTH INSURANCE | Attending: Specialist | Admitting: Physical Therapy

## 2015-04-12 ENCOUNTER — Encounter: Payer: Self-pay | Admitting: Physical Therapy

## 2015-04-12 DIAGNOSIS — M542 Cervicalgia: Secondary | ICD-10-CM | POA: Diagnosis not present

## 2015-04-12 DIAGNOSIS — M25512 Pain in left shoulder: Secondary | ICD-10-CM | POA: Diagnosis not present

## 2015-04-12 NOTE — Therapy (Signed)
[facility] [address] [place], [place], [address] Phone: [phone]   Fax:  [phone]  Physical Therapy Treatment  Patient Details  Name: Stacey Kelly MRN: [id number] Date of Birth: 06/17/1956 Referring Provider:  Collins, Robert, MD  Encounter Date: 04/12/2015      PT End of Session - 04/12/15 0838    Visit Number 4   PT Start Time 0800   PT Stop Time 0900   PT Time Calculation (min) 60 min   Activity Tolerance Patient tolerated treatment well      Past Medical History  Diagnosis Date  . OA (osteoarthritis)     LEFT SHOULDER AC JOINT  . Left rotator cuff tear   . PONV (postoperative nausea and vomiting)   . Heart murmur     ASYMPTOMATIC--  ECHO  1995  MILD REGURG  . Wears glasses     Past Surgical History  Procedure Laterality Date  . Right shoulder surgery  X3  LAST ONE 2010    INCLUDING ROTATOR CUFF REPAIR /  RECONSTRUCTION  . Bunionectomy Right 2004    BOTH SIDES OF FOOT  . Tonsillectomy  AGE 20  . Shoulder arthroscopy with rotator cuff repair Left 03/18/2014    Procedure: LEFT SHOULDER ARTHROSCOPY WITH EVALUATION UNDER ANESTHESIA DEBRIDEMENT SUBACROMIAL DECOMPRESSION DISTAL CLAVICAL RESECTION ROTATOR CUFF REPAIR BICEPS  TENDINOSIS;  Surgeon: Robert Collins, MD;  Location: [facility];  Service: Orthopedics;  Laterality: Left;  . Hardware removal Right 04/15/2014    Procedure: EXCISION OF RIGHT FOOT DORSAL HARDWARE;  Surgeon: John Hewitt, MD;  Location: [facility];  Service: Orthopedics;  Laterality: Right;    There were no vitals filed for this visit.  Visit Diagnosis:  Neck pain  Left shoulder pain      Subjective Assessment - 04/12/15 0803    Subjective I had increased left shoulder pain and fatigue after the last treatment.  Hard for me to drive.   Currently in Pain? Yes   Pain Score 2    Pain Location Shoulder   Pain Orientation Left;Anterior             [facility] PT Assessment - 04/12/15 0001    AROM   Left Shoulder Flexion 15 Degrees   Left Shoulder ABduction 157 Degrees   Left Shoulder Internal Rotation 58 Degrees   Left Shoulder External Rotation 86 Degrees                     [facility] Adult PT Treatment/Exercise - 04/12/15 0001    Neck Exercises: Machines for Strengthening   UBE (Upper Arm Bike) Level 4 x 4 minutes   Cybex Row 25# 2x15   Power Tower 10# scapstabilization 2 ways   Other Machines for Strengthening lats 20# 2x15   Other Machines for Strengthening 5# serratus push   Shoulder Exercises: ROM/Strengthening   "W" Arms red tband   Rhythmic Stabilization, Supine standing with weighted ball, two hands and one hand, isometric overhead ball carry   Electrical Stimulation   Electrical Stimulation Location neck and left shoulder   Electrical Stimulation Action IFC   Electrical Stimulation Parameters tolerance   Electrical Stimulation Goals Pain   Ultrasound   Ultrasound Location left anterior shoulder   Ultrasound Parameters 100% 1 MHz   Ultrasound Goals Pain   Traction   Type of Traction Cervical   Min (lbs) 12   Hold Time static   Time 15                    PT Short Term Goals - 03/30/15 0833    PT SHORT TERM GOAL #1   Title independent with initial HEP   Time 2   Period Weeks   Status New           PT Long Term Goals - 04/12/15 0839    PT LONG TERM GOAL #3   Title increase left shoulder abduction strength to 4-/5   Status On-going   PT LONG TERM GOAL #4   Title sleep without pain down the arm   Status Partially Met               Plan - 04/12/15 0838    Clinical Impression Statement Stacey Kelly is progressing, however she is weak and has difficulty with fatigue in the left shoulder.  ROM is improved.  Weakness is mostly with abduction.   PT Next Visit Plan work on abduction strength   Consulted and Agree with Plan of Care Patient        Problem List Patient  Active Problem List   Diagnosis Date Noted  . S/P rotator cuff repair 03/18/2014    ALBRIGHT,MICHAEL W., PT 04/12/2015, 8:40 AM  [facility] [address] [place], [place], [address] Phone: [phone]   Fax:  [phone]      

## 2015-04-15 ENCOUNTER — Ambulatory Visit: Payer: PRIVATE HEALTH INSURANCE | Attending: Specialist | Admitting: Physical Therapy

## 2015-04-15 ENCOUNTER — Encounter: Payer: Self-pay | Admitting: Physical Therapy

## 2015-04-15 DIAGNOSIS — M542 Cervicalgia: Secondary | ICD-10-CM | POA: Diagnosis present

## 2015-04-15 DIAGNOSIS — M25512 Pain in left shoulder: Secondary | ICD-10-CM | POA: Diagnosis not present

## 2015-04-15 NOTE — Therapy (Signed)
[place], [place], [address] Phone: [phone]   Fax:  [phone]  Physical Therapy Treatment  Patient Details  Name: Stacey Kelly MRN: [id number] Date of Birth: May 08, 1956 Referring Provider:  Sydnee Cabal, MD  Encounter Date: 04/15/2015      PT End of Session - 04/15/15 0930    Visit Number 5   PT Start Time 0800   PT Stop Time 0905   PT Time Calculation (min) 65 min   Activity Tolerance Patient tolerated treatment well      Past Medical History  Diagnosis Date  . OA (osteoarthritis)     LEFT SHOULDER AC JOINT  . Left rotator cuff tear   . PONV (postoperative nausea and vomiting)   . Heart murmur     ASYMPTOMATIC--  ECHO  1995  MILD REGURG  . Wears glasses     Past Surgical History  Procedure Laterality Date  . Right shoulder surgery  X3  LAST ONE 2010    INCLUDING ROTATOR CUFF REPAIR /  RECONSTRUCTION  . Bunionectomy Right 2004    BOTH SIDES OF FOOT  . Tonsillectomy  AGE 35  . Shoulder arthroscopy with rotator cuff repair Left 03/18/2014    Procedure: LEFT SHOULDER ARTHROSCOPY WITH EVALUATION UNDER ANESTHESIA DEBRIDEMENT SUBACROMIAL DECOMPRESSION DISTAL CLAVICAL RESECTION ROTATOR CUFF REPAIR BICEPS  TENDINOSIS;  Surgeon: Sydnee Cabal, MD;  Location: [facility];  Service: Orthopedics;  Laterality: Left;  . Hardware removal Right 04/15/2014    Procedure: EXCISION OF RIGHT FOOT DORSAL HARDWARE;  Surgeon: Wylene Simmer, MD;  Location: [facility];  Service: Orthopedics;  Laterality: Right;    There were no vitals filed for this visit.  Visit Diagnosis:  Neck pain  Left shoulder pain      Subjective Assessment - 04/15/15 0921    Subjective I am continuing to get better.  I am trying to get back to work at my normal job next week.   Currently in Pain? Yes   Pain Score 2    Pain Location Shoulder   Pain Orientation Left;Anterior   Aggravating  Factors  reaching out and up   Pain Relieving Factors rest, the exercises for the back seem to help with pain                         [facility] Adult PT Treatment/Exercise - 04/15/15 0001    Neck Exercises: Machines for Strengthening   UBE (Upper Arm Bike) Level 4 x 6 minutes   Cybex Row 25# 2x15   Power Tower 10# scapstabilization 2 ways   Primary school teacher for Strengthening lats 25# 2x15   Other Machines for Strengthening 5# serratus push   Shoulder Exercises: ROM/Strengthening   Rhythmic Stabilization, Supine standing with weighted ball, two hands and one hand, isometric overhead ball carry   Other ROM/Strengthening Exercises weighted ball overhead carry, 2# reverse fly, 4# bent over row   Electrical Stimulation   Electrical Stimulation Location neck and left shoulder   Electrical Stimulation Action IFC   Electrical Stimulation Parameters tolerance   Electrical Stimulation Goals Pain   Traction   Type of Traction Cervical   Min (lbs) 12   Hold Time static   Time 15                  PT Short Term Goals - 03/30/15 0833    PT SHORT TERM GOAL #1  Title independent with initial HEP   Time 2   Period Weeks   Status New           PT Long Term Goals - 04/12/15 4166    PT LONG TERM GOAL #3   Title increase left shoulder abduction strength to 4-/5   Status On-going   PT LONG TERM GOAL #4   Title sleep without pain down the arm   Status Partially Met               Plan - 04/15/15 0931    Clinical Impression Statement She will try to return to her normal job next week.  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  Still some crepitus especially with abduction   PT Next Visit Plan work on abduction strength   Consulted and Agree with Plan of Care Patient        Problem List Patient Active Problem List   Diagnosis Date Noted  . S/P rotator cuff repair 03/18/2014    Sumner Boast., PT 04/15/2015, 9:36 AM  [place] [address] [address] [place], [place], [address] Phone: [phone]   Fax:  [phone]

## 2015-04-19 ENCOUNTER — Encounter: Payer: Self-pay | Admitting: Physical Therapy

## 2015-04-19 ENCOUNTER — Ambulatory Visit: Payer: PRIVATE HEALTH INSURANCE | Attending: Specialist | Admitting: Physical Therapy

## 2015-04-19 DIAGNOSIS — M542 Cervicalgia: Secondary | ICD-10-CM | POA: Diagnosis present

## 2015-04-19 DIAGNOSIS — M25512 Pain in left shoulder: Secondary | ICD-10-CM | POA: Diagnosis not present

## 2015-04-19 NOTE — Therapy (Signed)
[place], [place], [address] Phone: [phone]   Fax:  [phone]  Physical Therapy Treatment  Patient Details  Name: Stacey Kelly MRN: [id number] Date of Birth: 1956/06/18 Referring Provider:  Sydnee Cabal, MD  Encounter Date: 04/19/2015      PT End of Session - 04/19/15 1637    Visit Number 6   PT Start Time 6962   PT Stop Time 1600   PT Time Calculation (min) 71 min      Past Medical History  Diagnosis Date  . OA (osteoarthritis)     LEFT SHOULDER AC JOINT  . Left rotator cuff tear   . PONV (postoperative nausea and vomiting)   . Heart murmur     ASYMPTOMATIC--  ECHO  1995  MILD REGURG  . Wears glasses     Past Surgical History  Procedure Laterality Date  . Right shoulder surgery  X3  LAST ONE 2010    INCLUDING ROTATOR CUFF REPAIR /  RECONSTRUCTION  . Bunionectomy Right 2004    BOTH SIDES OF FOOT  . Tonsillectomy  AGE 74  . Shoulder arthroscopy with rotator cuff repair Left 03/18/2014    Procedure: LEFT SHOULDER ARTHROSCOPY WITH EVALUATION UNDER ANESTHESIA DEBRIDEMENT SUBACROMIAL DECOMPRESSION DISTAL CLAVICAL RESECTION ROTATOR CUFF REPAIR BICEPS  TENDINOSIS;  Surgeon: Sydnee Cabal, MD;  Location: [facility];  Service: Orthopedics;  Laterality: Left;  . Hardware removal Right 04/15/2014    Procedure: EXCISION OF RIGHT FOOT DORSAL HARDWARE;  Surgeon: Wylene Simmer, MD;  Location: [facility];  Service: Orthopedics;  Laterality: Right;    There were no vitals filed for this visit.  Visit Diagnosis:  Neck pain  Left shoulder pain      Subjective Assessment - 04/19/15 1633    Subjective Went back to work today.   Currently in Pain? Yes   Pain Score 2                          [facility] Adult PT Treatment/Exercise - 04/19/15 0001    Neck Exercises: Machines for Strengthening   UBE (Upper Arm Bike) Level 4 x 6 minutes   Cybex Row  25# 2x15   Cybex Chest Press 10#   Power Tower 10# scapstabilization 2 ways   Other Machines for Strengthening lats 25# 2x15   Other Machines for Strengthening 5# serratus push   Shoulder Exercises: ROM/Strengthening   "W" Arms red tband   Rhythmic Stabilization, Supine standing with weighted ball, two hands and one hand, isometric overhead ball carry   Other ROM/Strengthening Exercises weighted ball overhead carry, 2# reverse fly, 4# bent over row   Other ROM/Strengthening Exercises a lot of work on abduction various angles and planes, also body blade   Electrical Stimulation   Electrical Stimulation Location neck and left shoulder   Electrical Stimulation Action IFC   Electrical Stimulation Parameters tolerance   Electrical Stimulation Goals Pain   Traction   Type of Traction Cervical   Min (lbs) 12   Hold Time static                  PT Short Term Goals - 03/30/15 9528    PT SHORT TERM GOAL #1   Title independent with initial HEP   Time 2   Period Weeks   Status New           PT Long Term Goals -  04/12/15 0839    PT LONG TERM GOAL #3   Title increase left shoulder abduction strength to 4-/5   Status On-going   PT LONG TERM GOAL #4   Title sleep without pain down the arm   Status Partially Met               Plan - 04/19/15 1638    Clinical Impression Statement Still some weakness iwth abduction, much improved today   PT Next Visit Plan work on abduction strength   Consulted and Agree with Plan of Care Patient        Problem List Patient Active Problem List   Diagnosis Date Noted  . S/P rotator cuff repair 03/18/2014    Sumner Boast., PT 04/19/2015, 4:39 PM  [place] [address] [place], [place], [address] Phone: [phone]   Fax:  [phone]

## 2015-04-22 ENCOUNTER — Encounter: Payer: Self-pay | Admitting: Physical Therapy

## 2015-04-22 ENCOUNTER — Ambulatory Visit: Payer: PRIVATE HEALTH INSURANCE | Attending: Specialist | Admitting: Physical Therapy

## 2015-04-22 DIAGNOSIS — M25512 Pain in left shoulder: Secondary | ICD-10-CM | POA: Insufficient documentation

## 2015-04-22 DIAGNOSIS — M542 Cervicalgia: Secondary | ICD-10-CM | POA: Diagnosis not present

## 2015-04-22 NOTE — Therapy (Signed)
[place], [place], [address] Phone: [phone]   Fax:  [phone]  Physical Therapy Treatment  Patient Details  Name: Stacey Kelly MRN: [id number] Date of Birth: 24-Mar-1956 Referring Provider:  Sydnee Cabal, MD  Encounter Date: 04/22/2015      PT End of Session - 04/22/15 0928    Visit Number 7   PT Start Time 6962   PT Stop Time 9528   PT Time Calculation (min) 55 min      Past Medical History  Diagnosis Date  . OA (osteoarthritis)     LEFT SHOULDER AC JOINT  . Left rotator cuff tear   . PONV (postoperative nausea and vomiting)   . Heart murmur     ASYMPTOMATIC--  ECHO  1995  MILD REGURG  . Wears glasses     Past Surgical History  Procedure Laterality Date  . Right shoulder surgery  X3  LAST ONE 2010    INCLUDING ROTATOR CUFF REPAIR /  RECONSTRUCTION  . Bunionectomy Right 2004    BOTH SIDES OF FOOT  . Tonsillectomy  AGE 24  . Shoulder arthroscopy with rotator cuff repair Left 03/18/2014    Procedure: LEFT SHOULDER ARTHROSCOPY WITH EVALUATION UNDER ANESTHESIA DEBRIDEMENT SUBACROMIAL DECOMPRESSION DISTAL CLAVICAL RESECTION ROTATOR CUFF REPAIR BICEPS  TENDINOSIS;  Surgeon: Sydnee Cabal, MD;  Location: [facility];  Service: Orthopedics;  Laterality: Left;  . Hardware removal Right 04/15/2014    Procedure: EXCISION OF RIGHT FOOT DORSAL HARDWARE;  Surgeon: Wylene Simmer, MD;  Location: [facility];  Service: Orthopedics;  Laterality: Right;    There were no vitals filed for this visit.  Visit Diagnosis:  Neck pain  Left shoulder pain      Subjective Assessment - 04/22/15 0853    Subjective Back to normal job, doing okay, I have not had to lift anyone yet, and when I have I get more help.                         [facility] Adult PT Treatment/Exercise - 04/22/15 0001    Neck Exercises: Machines for Strengthening   UBE (Upper Arm Bike)  Level 4 x 6 minutes   Cybex Row 25# 2x15   Cybex Chest Press 10#   Power Tower 10# scapstabilization 2 ways   Other Machines for Strengthening lats 25# 2x15   Other Machines for Strengthening 5# serratus push   Shoulder Exercises: ROM/Strengthening   "W" Arms red tband   Other ROM/Strengthening Exercises 5# bent over row, 1# reverse flies, 3# UE circuit   Other ROM/Strengthening Exercises a lot of work on abduction various angles and planes, also body blade   Electrical Stimulation   Electrical Stimulation Location neck and left shoulder   Electrical Stimulation Action IFC   Electrical Stimulation Parameters tolerance   Electrical Stimulation Goals Pain   Traction   Type of Traction Cervical   Min (lbs) 12   Hold Time static   Time 15                  PT Short Term Goals - 03/30/15 0833    PT SHORT TERM GOAL #1   Title independent with initial HEP   Time 2   Period Weeks   Status New           PT Long Term Goals - 04/22/15 4132    PT LONG TERM GOAL #  1   Title decrease pain 50%   Status Achieved   PT LONG TERM GOAL #2   Title increase cervical ROM of Side bending and rotation 25%   Status Achieved               Plan - 04/22/15 0929    Clinical Impression Statement Much improved, fatigues easily with overhead and abduction activity   PT Next Visit Plan work on abduction strength   Consulted and Agree with Plan of Care Patient        Problem List Patient Active Problem List   Diagnosis Date Noted  . S/P rotator cuff repair 03/18/2014    Sumner Boast., PT 04/22/2015, 9:30 AM  [place] [address] [place], [place], [address] Phone: [phone]   Fax:  [phone]

## 2016-03-04 DIAGNOSIS — J029 Acute pharyngitis, unspecified: Secondary | ICD-10-CM | POA: Diagnosis not present

## 2016-03-08 ENCOUNTER — Encounter: Payer: Self-pay | Admitting: Gastroenterology

## 2016-07-19 DIAGNOSIS — H52223 Regular astigmatism, bilateral: Secondary | ICD-10-CM | POA: Diagnosis not present

## 2016-07-19 DIAGNOSIS — H5203 Hypermetropia, bilateral: Secondary | ICD-10-CM | POA: Diagnosis not present

## 2016-07-19 DIAGNOSIS — H524 Presbyopia: Secondary | ICD-10-CM | POA: Diagnosis not present

## 2016-09-11 ENCOUNTER — Encounter: Payer: Self-pay | Admitting: Obstetrics & Gynecology

## 2016-09-11 DIAGNOSIS — Z1231 Encounter for screening mammogram for malignant neoplasm of breast: Secondary | ICD-10-CM | POA: Diagnosis not present

## 2016-09-18 DIAGNOSIS — Z1151 Encounter for screening for human papillomavirus (HPV): Secondary | ICD-10-CM | POA: Diagnosis not present

## 2016-09-18 DIAGNOSIS — Z01419 Encounter for gynecological examination (general) (routine) without abnormal findings: Secondary | ICD-10-CM | POA: Diagnosis not present

## 2016-09-18 DIAGNOSIS — Z6824 Body mass index (BMI) 24.0-24.9, adult: Secondary | ICD-10-CM | POA: Diagnosis not present

## 2016-09-25 DIAGNOSIS — Z1211 Encounter for screening for malignant neoplasm of colon: Secondary | ICD-10-CM | POA: Diagnosis not present

## 2016-09-25 DIAGNOSIS — Z1159 Encounter for screening for other viral diseases: Secondary | ICD-10-CM | POA: Diagnosis not present

## 2016-09-25 DIAGNOSIS — Z780 Asymptomatic menopausal state: Secondary | ICD-10-CM | POA: Diagnosis not present

## 2016-09-25 DIAGNOSIS — T65811D Toxic effect of latex, accidental (unintentional), subsequent encounter: Secondary | ICD-10-CM | POA: Diagnosis not present

## 2016-09-25 DIAGNOSIS — Z0000 Encounter for general adult medical examination without abnormal findings: Secondary | ICD-10-CM | POA: Diagnosis not present

## 2016-09-25 DIAGNOSIS — J301 Allergic rhinitis due to pollen: Secondary | ICD-10-CM | POA: Diagnosis not present

## 2016-09-25 MED FILL — EPINEPHRINE 0.3 MG AUTO-INJ: 0.3 | 30 days supply | Qty: 2 | Fill #0

## 2016-09-25 MED FILL — FLUTICASONE PROP 50 MCG SPR: 50 | 90 days supply | Qty: 48 | Fill #0

## 2016-09-27 DIAGNOSIS — Z780 Asymptomatic menopausal state: Secondary | ICD-10-CM | POA: Diagnosis not present

## 2016-09-27 DIAGNOSIS — J301 Allergic rhinitis due to pollen: Secondary | ICD-10-CM | POA: Diagnosis not present

## 2016-09-27 DIAGNOSIS — T65811D Toxic effect of latex, accidental (unintentional), subsequent encounter: Secondary | ICD-10-CM | POA: Diagnosis not present

## 2016-09-27 DIAGNOSIS — Z1211 Encounter for screening for malignant neoplasm of colon: Secondary | ICD-10-CM | POA: Diagnosis not present

## 2016-09-27 DIAGNOSIS — Z1159 Encounter for screening for other viral diseases: Secondary | ICD-10-CM | POA: Diagnosis not present

## 2016-09-27 DIAGNOSIS — Z0000 Encounter for general adult medical examination without abnormal findings: Secondary | ICD-10-CM | POA: Diagnosis not present

## 2016-09-28 DIAGNOSIS — M8588 Other specified disorders of bone density and structure, other site: Secondary | ICD-10-CM | POA: Diagnosis not present

## 2016-12-10 ENCOUNTER — Ambulatory Visit (INDEPENDENT_AMBULATORY_CARE_PROVIDER_SITE_OTHER): Payer: 59 | Admitting: Family Medicine

## 2016-12-10 ENCOUNTER — Encounter: Payer: Self-pay | Admitting: Family Medicine

## 2016-12-10 VITALS — BP 120/54 | HR 79 | Temp 97.3°F | Ht 65.5 in | Wt 156.2 lb

## 2016-12-10 DIAGNOSIS — R300 Dysuria: Secondary | ICD-10-CM | POA: Diagnosis not present

## 2016-12-10 DIAGNOSIS — R350 Frequency of micturition: Secondary | ICD-10-CM | POA: Diagnosis not present

## 2016-12-10 LAB — POC MICROSCOPIC URINALYSIS (UMFC): Mucus: ABSENT

## 2016-12-10 LAB — POCT URINALYSIS DIP (MANUAL ENTRY)
Bilirubin, UA: NEGATIVE
Glucose, UA: NEGATIVE
Ketones, POC UA: NEGATIVE
NITRITE UA: NEGATIVE
Protein Ur, POC: NEGATIVE
Spec Grav, UA: 1.01
Urobilinogen, UA: 0.2
pH, UA: 7

## 2016-12-10 MED ORDER — PHENAZOPYRIDINE HCL 100 MG PO TABS: 100.0000 mg | ORAL_TABLET | Freq: Three times a day (TID) | ORAL | 0 refills | Status: DC | PRN

## 2016-12-10 MED ORDER — NITROFURANTOIN MONOHYD MACRO 100 MG PO CAPS: 100.0000 mg | ORAL_CAPSULE | Freq: Two times a day (BID) | ORAL | 0 refills | Status: DC

## 2016-12-10 NOTE — Patient Instructions (Addendum)
Start Macrobid, Pyridium up to 3 times per day as needed for burning or frequency. Drink plenty of fluids.  I will let you know if there are any concerns or resistance on your urine culture.  Return to the clinic or go to the nearest emergency room if any of your symptoms worsen or new symptoms occur.   Urinary Tract Infection, Adult A urinary tract infection (UTI) is an infection of any part of the urinary tract, which includes the kidneys, ureters, bladder, and urethra. These organs make, store, and get rid of urine in the body. UTI can be a bladder infection (cystitis) or kidney infection (pyelonephritis). What are the causes? This infection may be caused by fungi, viruses, or bacteria. Bacteria are the most common cause of UTIs. This condition can also be caused by repeated incomplete emptying of the bladder during urination. What increases the risk? This condition is more likely to develop if:  You ignore your need to urinate or hold urine for long periods of time.  You do not empty your bladder completely during urination.  You wipe back to front after urinating or having a bowel movement, if you are female.  You are uncircumcised, if you are female.  You are constipated.  You have a urinary catheter that stays in place (indwelling).  You have a weak defense (immune) system.  You have a medical condition that affects your bowels, kidneys, or bladder.  You have diabetes.  You take antibiotic medicines frequently or for long periods of time, and the antibiotics no longer work well against certain types of infections (antibiotic resistance).  You take medicines that irritate your urinary tract.  You are exposed to chemicals that irritate your urinary tract.  You are female. What are the signs or symptoms? Symptoms of this condition include:  Fever.  Frequent urination or passing small amounts of urine frequently.  Needing to urinate urgently.  Pain or burning with  urination.  Urine that smells bad or unusual.  Cloudy urine.  Pain in the lower abdomen or back.  Trouble urinating.  Blood in the urine.  Vomiting or being less hungry than normal.  Diarrhea or abdominal pain.  Vaginal discharge, if you are female. How is this diagnosed? This condition is diagnosed with a medical history and physical exam. You will also need to provide a urine sample to test your urine. Other tests may be done, including:  Blood tests.  Sexually transmitted disease (STD) testing. If you have had more than one UTI, a cystoscopy or imaging studies may be done to determine the cause of the infections. How is this treated? Treatment for this condition often includes a combination of two or more of the following:  Antibiotic medicine.  Other medicines to treat less common causes of UTI.  Over-the-counter medicines to treat pain.  Drinking enough water to stay hydrated. Follow these instructions at home:  Take over-the-counter and prescription medicines only as told by your health care provider.  If you were prescribed an antibiotic, take it as told by your health care provider. Do not stop taking the antibiotic even if you start to feel better.  Avoid alcohol, caffeine, tea, and carbonated beverages. They can irritate your bladder.  Drink enough fluid to keep your urine clear or pale yellow.  Keep all follow-up visits as told by your health care provider. This is important.  Make sure to:  Empty your bladder often and completely. Do not hold urine for long periods of time.  Empty your bladder before and after sex.  Wipe from front to back after a bowel movement if you are female. Use each tissue one time when you wipe. Contact a health care provider if:  You have back pain.  You have a fever.  You feel nauseous or vomit.  Your symptoms do not get better after 3 days.  Your symptoms go away and then return. Get help right away if:  You  have severe back pain or lower abdominal pain.  You are vomiting and cannot keep down any medicines or water. This information is not intended to replace advice given to you by your health care provider. Make sure you discuss any questions you have with your health care provider. Document Released: 07/25/2005 Document Revised: 03/28/2016 Document Reviewed: 09/05/2015 Elsevier Interactive Patient Education  2017 [employer].    IF you received an x-ray today, you will receive an invoice from [facility]. Please contact [facility] Rad[facility] at [phone] with questions or concerns regarding your invoice.   IF you received labwork today, you will receive an invoice from [facility]. Please contact [facility] at [phone] with questions or concerns regarding your invoice.   Our billing staff will not be able to assist you with questions regarding bills from these companies.  You will be contacted with the lab results as soon as they are available. The fastest way to get your results is to activate your My Chart account. Instructions are located on the last page of this paperwork. If you have not heard from [place] regarding the results in 2 weeks, please contact this office.

## 2016-12-10 NOTE — Progress Notes (Signed)
By signing my name below, I, Mesha Guinyard, attest that this documentation has been prepared under the direction and in the presence of Merri Ray, MD.  Electronically Signed: Verlee Monte, Medical Scribe. 12/10/16. 4:43 PM.  Subjective:    Patient ID: Stacey Kelly, female    DOB: Mar 14, 1956, 61 y.o.   MRN: [id number]  HPI Chief Complaint  Patient presents with  . Dysuria    X 3-4 days with burning    HPI Comments: Stacey Kelly is a 61 y.o. female who presents to the [facility] complaining of dysuria onset 3 days ago. Reports urinary urgency, and urinary frequency. When she finishes using the bathroom she feels like she has to go again. Pt has been drinking water, and cranberry juice - no other medications used. 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 Pt hasn't recently traveled, been in a hot tub, or taken abxs. Denies vaginal discharge, back pain, nausea, emesis, and fevers.  Patient Active Problem List   Diagnosis Date Noted  . S/P rotator cuff repair 03/18/2014   Past Medical History:  Diagnosis Date  . Heart murmur    ASYMPTOMATIC--  ECHO  1995  MILD REGURG  . Left rotator cuff tear   . OA (osteoarthritis)    LEFT SHOULDER AC JOINT  . PONV (postoperative nausea and vomiting)   . Wears glasses    Past Surgical History:  Procedure Laterality Date  . BUNIONECTOMY Right 2004   BOTH SIDES OF FOOT  . HARDWARE REMOVAL Right 04/15/2014   Procedure: EXCISION OF RIGHT FOOT DORSAL HARDWARE;  Surgeon: Wylene Simmer, MD;  Location: [facility];  Service: Orthopedics;  Laterality: Right;  . RIGHT SHOULDER SURGERY  X3  LAST ONE 2010   INCLUDING ROTATOR CUFF REPAIR /  RECONSTRUCTION  . SHOULDER ARTHROSCOPY WITH ROTATOR CUFF REPAIR Left 03/18/2014   Procedure: LEFT SHOULDER ARTHROSCOPY WITH EVALUATION UNDER ANESTHESIA DEBRIDEMENT SUBACROMIAL DECOMPRESSION  DISTAL CLAVICAL RESECTION ROTATOR CUFF REPAIR BICEPS  TENDINOSIS;  Surgeon: Sydnee Cabal, MD;  Location: [facility];  Service: Orthopedics;  Laterality: Left;  . TONSILLECTOMY  AGE 38   Allergies  Allergen Reactions  . Latex Anaphylaxis   Prior to Admission medications   Medication Sig Start Date End Date Taking? Authorizing Provider  Calcium Carb-Cholecalciferol (CALCIUM 600 + D PO) Take 1 tablet by mouth 2 (two) times daily.   Yes Historical Provider, MD  cetirizine (ZYRTEC) 10 MG tablet Take 10 mg by mouth every evening.   Yes Historical Provider, MD  Cholecalciferol (VITAMIN D3) 2000 UNITS TABS Take 1 capsule by mouth daily.   Yes Historical Provider, MD  fluticasone (FLONASE) 50 MCG/ACT nasal spray Place 1 spray into both nostrils 2 (two) times daily.   Yes Historical Provider, MD  Multiple Vitamin (MULTIVITAMIN) tablet Take 1 tablet by mouth daily.   Yes Historical Provider, MD  cyclobenzaprine (FLEXERIL) 10 MG tablet Take 10 mg by mouth at bedtime.    Historical Provider, MD  diclofenac sodium (VOLTAREN) 1 % GEL Apply topically 4 (four) times daily as needed.    Historical Provider, MD  oxyCODONE-acetaminophen (PERCOCET/ROXICET) 5-325 MG per tablet Take by mouth every 4 (four) hours as needed for severe pain.    Historical Provider, MD   Social History   Social History  . Marital status: Married    Spouse name: N/A  .  Number of children: N/A  . Years of education: N/A   Occupational History  . Not on file.   Social History Main Topics  . Smoking status: Never Smoker  . Smokeless tobacco: Never Used  . Alcohol use 0.6 oz/week    1 Glasses of wine per week  . Drug use: No  . Sexual activity: Not on file   Other Topics Concern  . Not on file   Social History Narrative  . No narrative on file   Review of Systems  Constitutional: Negative for fever.  Gastrointestinal: Negative for nausea and vomiting.  Genitourinary: Positive for dysuria, frequency  and urgency. Negative for vaginal discharge.  Musculoskeletal: Negative for back pain.   Objective:  Physical Exam  Constitutional: She appears well-developed and well-nourished. No distress.  HENT:  Head: Normocephalic and atraumatic.  Eyes: Conjunctivae are normal.  Neck: Neck supple.  Cardiovascular: Normal rate, regular rhythm and normal heart sounds.  Exam reveals no friction rub.   No murmur heard. Pulmonary/Chest: Effort normal and breath sounds normal. No respiratory distress. She has no wheezes. She has no rales.  Abdominal: Soft. There is no tenderness. There is no rebound, no guarding and no CVA tenderness.  Neurological: She is alert.  Skin: Skin is warm and dry.  Psychiatric: She has a normal mood and affect. Her behavior is normal.  Nursing note and vitals reviewed.  BP (!) 120/54 (BP Location: Right Arm, Patient Position: Sitting, Cuff Size: Small)   Pulse 79   Temp 97.3 F (36.3 C) (Oral)   Ht 5' 5.5" (1.664 m)   Wt 156 lb 3.2 oz (70.9 kg)   SpO2 98%   BMI 25.60 kg/m    Results for orders placed or performed in visit on 12/10/16  POCT urinalysis dipstick  Result Value Ref Range   Color, UA yellow yellow   Clarity, UA clear clear   Glucose, UA negative negative   Bilirubin, UA negative negative   Ketones, POC UA negative negative   Spec Grav, UA 1.010    Blood, UA small (A) negative   pH, UA 7.0    Protein Ur, POC negative negative   Urobilinogen, UA 0.2    Nitrite, UA Negative Negative   Leukocytes, UA small (1+) (A) Negative  POCT Microscopic Urinalysis ([facility])  Result Value Ref Range   WBC,UR,HPF,POC Few (A) None WBC/hpf   RBC,UR,HPF,POC None None RBC/hpf   Bacteria None None, Too numerous to count   Mucus Absent Absent   Epithelial Cells, UR Per Microscopy Few (A) None, Too numerous to count cells/hpf    Assessment & Plan:    Stacey Kelly is a 61 y.o. female Dysuria - Plan: POCT urinalysis dipstick, POCT Microscopic Urinalysis ([facility]),  nitrofurantoin, macrocrystal-monohydrate, (MACROBID) 100 MG capsule, phenazopyridine (PYRIDIUM) 100 MG tablet, Urine culture  Urinary frequency - Plan: POCT urinalysis dipstick, POCT Microscopic Urinalysis ([facility]), nitrofurantoin, macrocrystal-monohydrate, (MACROBID) 100 MG capsule, phenazopyridine (PYRIDIUM) 100 MG tablet, Urine culture  Cystitis, early, no concerning findings on exam or history.  -Start Macrobid 100 mg twice a day, Pyridium 100 mg 3 times a day when necessary, push fluids, RTC precautions, check urine culture.  Meds ordered this encounter  Medications  . nitrofurantoin, macrocrystal-monohydrate, (MACROBID) 100 MG capsule    Sig: Take 1 capsule (100 mg total) by mouth 2 (two) times daily.    Dispense:  14 capsule    Refill:  0  . phenazopyridine (PYRIDIUM) 100 MG tablet    Sig: Take 1 tablet (  100 mg total) by mouth 3 (three) times daily as needed for pain.    Dispense:  15 tablet    Refill:  0   Patient Instructions    Start Macrobid, Pyridium up to 3 times per day as needed for burning or frequency. Drink plenty of fluids.  I will let you know if there are any concerns or resistance on your urine culture.  Return to the clinic or go to the nearest emergency room if any of your symptoms worsen or new symptoms occur.   Urinary Tract Infection, Adult A urinary tract infection (UTI) is an infection of any part of the urinary tract, which includes the kidneys, ureters, bladder, and urethra. These organs make, store, and get rid of urine in the body. UTI can be a bladder infection (cystitis) or kidney infection (pyelonephritis). What are the causes? This infection may be caused by fungi, viruses, or bacteria. Bacteria are the most common cause of UTIs. This condition can also be caused by repeated incomplete emptying of the bladder during urination. What increases the risk? This condition is more likely to develop if:  You ignore your need to urinate or hold urine for  long periods of time.  You do not empty your bladder completely during urination.  You wipe back to front after urinating or having a bowel movement, if you are female.  You are uncircumcised, if you are female.  You are constipated.  You have a urinary catheter that stays in place (indwelling).  You have a weak defense (immune) system.  You have a medical condition that affects your bowels, kidneys, or bladder.  You have diabetes.  You take antibiotic medicines frequently or for long periods of time, and the antibiotics no longer work well against certain types of infections (antibiotic resistance).  You take medicines that irritate your urinary tract.  You are exposed to chemicals that irritate your urinary tract.  You are female. What are the signs or symptoms? Symptoms of this condition include:  Fever.  Frequent urination or passing small amounts of urine frequently.  Needing to urinate urgently.  Pain or burning with urination.  Urine that smells bad or unusual.  Cloudy urine.  Pain in the lower abdomen or back.  Trouble urinating.  Blood in the urine.  Vomiting or being less hungry than normal.  Diarrhea or abdominal pain.  Vaginal discharge, if you are female. How is this diagnosed? This condition is diagnosed with a medical history and physical exam. You will also need to provide a urine sample to test your urine. Other tests may be done, including:  Blood tests.  Sexually transmitted disease (STD) testing. If you have had more than one UTI, a cystoscopy or imaging studies may be done to determine the cause of the infections. How is this treated? Treatment for this condition often includes a combination of two or more of the following:  Antibiotic medicine.  Other medicines to treat less common causes of UTI.  Over-the-counter medicines to treat pain.  Drinking enough water to stay hydrated. Follow these instructions at home:  Take  over-the-counter and prescription medicines only as told by your health care provider.  If you were prescribed an antibiotic, take it as told by your health care provider. Do not stop taking the antibiotic even if you start to feel better.  Avoid alcohol, caffeine, tea, and carbonated beverages. They can irritate your bladder.  Drink enough fluid to keep your urine clear or pale yellow.  Keep  all follow-up visits as told by your health care provider. This is important.  Make sure to:  Empty your bladder often and completely. Do not hold urine for long periods of time.  Empty your bladder before and after sex.  Wipe from front to back after a bowel movement if you are female. Use each tissue one time when you wipe. Contact a health care provider if:  You have back pain.  You have a fever.  You feel nauseous or vomit.  Your symptoms do not get better after 3 days.  Your symptoms go away and then return. Get help right away if:  You have severe back pain or lower abdominal pain.  You are vomiting and cannot keep down any medicines or water. This information is not intended to replace advice given to you by your health care provider. Make sure you discuss any questions you have with your health care provider. Document Released: 07/25/2005 Document Revised: 03/28/2016 Document Reviewed: 09/05/2015 Elsevier Interactive Patient Education  2017 [employer].    IF you received an x-ray today, you will receive an invoice from [facility] Radiology. Please contact [facility]iology at [phone] with questions or concerns regarding your invoice.   IF you received labwork today, you will receive an invoice from [facility]. Please contact [facility] at [phone] with questions or concerns regarding your invoice.   Our billing staff will not be able to assist you with questions regarding bills from these companies.  You will be contacted with the lab results as soon as they are  available. The fastest way to get your results is to activate your My Chart account. Instructions are located on the last page of this paperwork. If you have not heard from [place] regarding the results in 2 weeks, please contact this office.

## 2016-12-13 ENCOUNTER — Telehealth: Payer: Self-pay | Admitting: Emergency Medicine

## 2016-12-13 LAB — URINE CULTURE

## 2016-12-13 NOTE — Telephone Encounter (Signed)
-----   Message from Wendie Agreste, MD sent at 12/13/2016 10:49 AM EST ----- Call patient.  Urine culture did show infection, but sensitive to antibiotic prescribed.  Finish course of antibiotic and rtc if not improved - sooner if worse.

## 2017-02-05 DIAGNOSIS — J069 Acute upper respiratory infection, unspecified: Secondary | ICD-10-CM | POA: Diagnosis not present

## 2017-09-23 DIAGNOSIS — Z1231 Encounter for screening mammogram for malignant neoplasm of breast: Secondary | ICD-10-CM | POA: Diagnosis not present

## 2017-09-26 ENCOUNTER — Encounter: Payer: Self-pay | Admitting: Obstetrics & Gynecology

## 2017-09-26 ENCOUNTER — Ambulatory Visit (INDEPENDENT_AMBULATORY_CARE_PROVIDER_SITE_OTHER): Payer: 59 | Admitting: Obstetrics & Gynecology

## 2017-09-26 VITALS — BP 130/80 | Ht 65.0 in | Wt 165.0 lb

## 2017-09-26 DIAGNOSIS — Z01419 Encounter for gynecological examination (general) (routine) without abnormal findings: Secondary | ICD-10-CM

## 2017-09-26 DIAGNOSIS — Z780 Asymptomatic menopausal state: Secondary | ICD-10-CM | POA: Diagnosis not present

## 2017-09-26 MED ORDER — EPINEPHRINE 0.3 MG/0.3ML IJ SOAJ: 0.3000 mg | Freq: Once | INTRAMUSCULAR | 3 refills | Status: AC

## 2017-09-26 MED FILL — EPINEPHRINE 0.3 MG AUTO-INJ: 0.3 | 2 days supply | Qty: 2 | Fill #0

## 2017-09-26 NOTE — Addendum Note (Signed)
Addended by: Thurnell Garbe A on: 09/26/2017 09:41 AM   Modules accepted: Orders

## 2017-09-26 NOTE — Patient Instructions (Signed)
1. Encounter for routine gynecological examination with Papanicolaou smear of cervix Normal gynecologic exam.  Pap reflex done.  Breast exam normal.  Recent screening mammogram negative November 2018.  Fasting health labs with family physician.  Colonoscopy 2012.  2. Menopause present Menopause well-tolerated without hormone replacement therapy.  No postmenopausal bleeding.  Taking vitamin D supplements with calcium supplements.  Weightbearing physical activity regularly.  Will schedule bone density at [facility].  Referral written to patient.  Stacey Kelly, it was a pleasure seeing you today!  I will inform you of your results as soon as available.   Health Maintenance for Postmenopausal Women Menopause is a normal process in which your reproductive ability comes to an end. This process happens gradually over a span of months to years, usually between the ages of 65 and 18. Menopause is complete when you have missed 12 consecutive menstrual periods. It is important to talk with your health care provider about some of the most common conditions that affect postmenopausal women, such as heart disease, cancer, and bone loss (osteoporosis). Adopting a healthy lifestyle and getting preventive care can help to promote your health and wellness. Those actions can also lower your chances of developing some of these common conditions. What should I know about menopause? During menopause, you may experience a number of symptoms, such as:  Moderate-to-severe hot flashes.  Night sweats.  Decrease in sex drive.  Mood swings.  Headaches.  Tiredness.  Irritability.  Memory problems.  Insomnia.  Choosing to treat or not to treat menopausal changes is an individual decision that you make with your health care provider. What should I know about hormone replacement therapy and supplements? Hormone therapy products are effective for treating symptoms that are associated with menopause, such as hot flashes and  night sweats. Hormone replacement carries certain risks, especially as you become older. If you are thinking about using estrogen or estrogen with progestin treatments, discuss the benefits and risks with your health care provider. What should I know about heart disease and stroke? Heart disease, heart attack, and stroke become more likely as you age. This may be due, in part, to the hormonal changes that your body experiences during menopause. These can affect how your body processes dietary fats, triglycerides, and cholesterol. Heart attack and stroke are both medical emergencies. There are many things that you can do to help prevent heart disease and stroke:  Have your blood pressure checked at least every 1-2 years. High blood pressure causes heart disease and increases the risk of stroke.  If you are 83-14 years old, ask your health care provider if you should take aspirin to prevent a heart attack or a stroke.  Do not use any tobacco products, including cigarettes, chewing tobacco, or electronic cigarettes. If you need help quitting, ask your health care provider.  It is important to eat a healthy diet and maintain a healthy weight. ? Be sure to include plenty of vegetables, fruits, low-fat dairy products, and lean protein. ? Avoid eating foods that are high in solid fats, added sugars, or salt (sodium).  Get regular exercise. This is one of the most important things that you can do for your health. ? Try to exercise for at least 150 minutes each week. The type of exercise that you do should increase your heart rate and make you sweat. This is known as moderate-intensity exercise. ? Try to do strengthening exercises at least twice each week. Do these in addition to the moderate-intensity exercise.  Know your  numbers.Ask your health care provider to check your cholesterol and your blood glucose. Continue to have your blood tested as directed by your health care provider.  What should I  know about cancer screening? There are several types of cancer. Take the following steps to reduce your risk and to catch any cancer development as early as possible. Breast Cancer  Practice breast self-awareness. ? This means understanding how your breasts normally appear and feel. ? It also means doing regular breast self-exams. Let your health care provider know about any changes, no matter how small.  If you are 15 or older, have a clinician do a breast exam (clinical breast exam or CBE) every year. Depending on your age, family history, and medical history, it may be recommended that you also have a yearly breast X-ray (mammogram).  If you have a family history of breast cancer, talk with your health care provider about genetic screening.  If you are at high risk for breast cancer, talk with your health care provider about having an MRI and a mammogram every year.  Breast cancer (BRCA) gene test is recommended for women who have family members with BRCA-related cancers. Results of the assessment will determine the need for genetic counseling and BRCA1 and for BRCA2 testing. BRCA-related cancers include these types: ? Breast. This occurs in males or females. ? Ovarian. ? Tubal. This may also be called fallopian tube cancer. ? Cancer of the abdominal or pelvic lining (peritoneal cancer). ? Prostate. ? Pancreatic.  Cervical, Uterine, and Ovarian Cancer Your health care provider may recommend that you be screened regularly for cancer of the pelvic organs. These include your ovaries, uterus, and vagina. This screening involves a pelvic exam, which includes checking for microscopic changes to the surface of your cervix (Pap test).  For women ages 21-65, health care providers may recommend a pelvic exam and a Pap test every three years. For women ages 22-65, they may recommend the Pap test and pelvic exam, combined with testing for human papilloma virus (HPV), every five years. Some types of  HPV increase your risk of cervical cancer. Testing for HPV may also be done on women of any age who have unclear Pap test results.  Other health care providers may not recommend any screening for nonpregnant women who are considered low risk for pelvic cancer and have no symptoms. Ask your health care provider if a screening pelvic exam is right for you.  If you have had past treatment for cervical cancer or a condition that could lead to cancer, you need Pap tests and screening for cancer for at least 20 years after your treatment. If Pap tests have been discontinued for you, your risk factors (such as having a new sexual partner) need to be reassessed to determine if you should start having screenings again. Some women have medical problems that increase the chance of getting cervical cancer. In these cases, your health care provider may recommend that you have screening and Pap tests more often.  If you have a family history of uterine cancer or ovarian cancer, talk with your health care provider about genetic screening.  If you have vaginal bleeding after reaching menopause, tell your health care provider.  There are currently no reliable tests available to screen for ovarian cancer.  Lung Cancer Lung cancer screening is recommended for adults 74-23 years old who are at high risk for lung cancer because of a history of smoking. A yearly low-dose CT scan of the lungs is  recommended if you:  Currently smoke.  Have a history of at least 30 pack-years of smoking and you currently smoke or have quit within the past 15 years. A pack-year is smoking an average of one pack of cigarettes per day for one year.  Yearly screening should:  Continue until it has been 15 years since you quit.  Stop if you develop a health problem that would prevent you from having lung cancer treatment.  Colorectal Cancer  This type of cancer can be detected and can often be prevented.  Routine colorectal cancer  screening usually begins at age 19 and continues through age 57.  If you have risk factors for colon cancer, your health care provider may recommend that you be screened at an earlier age.  If you have a family history of colorectal cancer, talk with your health care provider about genetic screening.  Your health care provider may also recommend using home test kits to check for hidden blood in your stool.  A small camera at the end of a tube can be used to examine your colon directly (sigmoidoscopy or colonoscopy). This is done to check for the earliest forms of colorectal cancer.  Direct examination of the colon should be repeated every 5-10 years until age 68. However, if early forms of precancerous polyps or small growths are found or if you have a family history or genetic risk for colorectal cancer, you may need to be screened more often.  Skin Cancer  Check your skin from head to toe regularly.  Monitor any moles. Be sure to tell your health care provider: ? About any new moles or changes in moles, especially if there is a change in a mole's shape or color. ? If you have a mole that is larger than the size of a pencil eraser.  If any of your family members has a history of skin cancer, especially at a young age, talk with your health care provider about genetic screening.  Always use sunscreen. Apply sunscreen liberally and repeatedly throughout the day.  Whenever you are outside, protect yourself by wearing long sleeves, pants, a wide-brimmed hat, and sunglasses.  What should I know about osteoporosis? Osteoporosis is a condition in which bone destruction happens more quickly than new bone creation. After menopause, you may be at an increased risk for osteoporosis. To help prevent osteoporosis or the bone fractures that can happen because of osteoporosis, the following is recommended:  If you are 48-68 years old, get at least 1,000 mg of calcium and at least 600 mg of vitamin D  per day.  If you are older than age 43 but younger than age 24, get at least 1,200 mg of calcium and at least 600 mg of vitamin D per day.  If you are older than age 52, get at least 1,200 mg of calcium and at least 800 mg of vitamin D per day.  Smoking and excessive alcohol intake increase the risk of osteoporosis. Eat foods that are rich in calcium and vitamin D, and do weight-bearing exercises several times each week as directed by your health care provider. What should I know about how menopause affects my mental health? Depression may occur at any age, but it is more common as you become older. Common symptoms of depression include:  Low or sad mood.  Changes in sleep patterns.  Changes in appetite or eating patterns.  Feeling an overall lack of motivation or enjoyment of activities that you previously enjoyed.  Frequent crying spells.  Talk with your health care provider if you think that you are experiencing depression. What should I know about immunizations? It is important that you get and maintain your immunizations. These include:  Tetanus, diphtheria, and pertussis (Tdap) booster vaccine.  Influenza every year before the flu season begins.  Pneumonia vaccine.  Shingles vaccine.  Your health care provider may also recommend other immunizations. This information is not intended to replace advice given to you by your health care provider. Make sure you discuss any questions you have with your health care provider. Document Released: 12/07/2005 Document Revised: 05/04/2016 Document Reviewed: 07/19/2015 Elsevier Interactive Patient Education  2018 [employer].

## 2017-09-26 NOTE — Progress Notes (Signed)
Stacey Kelly 03-26-1956 [id number]   History:    61 y.o. G2P1A1L1 Married.  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  Daughter going into [place]  RP:  Established patient presenting for annual gyn exam   HPI: Menopausal, well without hormone replacement therapy.  No postmenopausal bleeding.  No pelvic pain.  Not sexually active.  Breasts normal.  Urine and bowel movements normal.  Past medical history,surgical history, family history and social history were all reviewed and documented in the EPIC chart.  Gynecologic History No LMP recorded. Patient is postmenopausal. Contraception: abstinence and post menopausal status Last Pap: 08/2016. Results were: Neg/HPV HR neg Last mammogram: 08/2017. Results were: Negative [place]ono 2012, 10 years Bone Density overdue, will schedule at [facility]  Obstetric History OB History  Gravida Para Term Preterm AB Living  2       1 1   SAB TAB Ectopic Multiple Live Births               # Outcome Date GA Lbr Len/2nd Weight Sex Delivery Anes PTL Lv  2 Gravida           1 AB                ROS: A ROS was performed and pertinent positives and negatives are included in the history.  GENERAL: No fevers or chills. HEENT: No change in vision, no earache, sore throat or sinus congestion. NECK: No pain or stiffness. CARDIOVASCULAR: No chest pain or pressure. No palpitations. PULMONARY: No shortness of breath, cough or wheeze. GASTROINTESTINAL: No abdominal pain, nausea, vomiting or diarrhea, melena or bright red blood per rectum. GENITOURINARY: No urinary frequency, urgency, hesitancy or dysuria. MUSCULOSKELETAL: No joint or muscle pain, no back pain, no recent trauma. DERMATOLOGIC: No rash, no itching, no lesions. ENDOCRINE: No polyuria, polydipsia, no heat or cold intolerance. No recent change in weight. HEMATOLOGICAL: No anemia or easy bruising or bleeding. NEUROLOGIC: No headache, seizures, numbness, tingling or weakness. PSYCHIATRIC: No  depression, no loss of interest in normal activity or change in sleep pattern.     Exam:   BP 130/80 (BP Location: Right Arm, Patient Position: Sitting, Cuff Size: Normal)   Ht 5\' 5"  (1.651 m)   Wt 165 lb (74.8 kg)   BMI 27.46 kg/m   Body mass index is 27.46 kg/m.  General appearance : Well developed well nourished female. No acute distress HEENT: Eyes: no retinal hemorrhage or exudates,  Neck supple, trachea midline, no carotid bruits, no thyroidmegaly Lungs: Clear to auscultation, no rhonchi or wheezes, or rib retractions  Heart: Regular rate and rhythm, no murmurs or gallops Breast:Examined in sitting and supine position were symmetrical in appearance, no palpable masses or tenderness,  no skin retraction, no nipple inversion, no nipple discharge, no skin discoloration, no axillary or supraclavicular lymphadenopathy Abdomen: no palpable masses or tenderness, no rebound or guarding Extremities: no edema or skin discoloration or tenderness  Pelvic: Vulva normal  Bartholin, Urethra, Skene Glands: Within normal limits             Vagina: No gross lesions or discharge  Cervix: No gross lesions or discharge.  Pap reflex done.  Uterus  AV, normal size, shape and consistency, non-tender and mobile  Adnexa  Without masses or tenderness  Anus and perineum  normal    Assessment/Plan:  61 y.o. female for annual exam   1. Encounter for routine gynecological examination with Papanicolaou smear of cervix Normal gynecologic exam.  Pap reflex  done.  Breast exam normal.  Recent screening mammogram negative November 2018.  Fasting health labs with family physician.  Colonoscopy 2012.  2. Menopause present Menopause well-tolerated without hormone replacement therapy.  No postmenopausal bleeding.  Taking vitamin D supplements with calcium supplements.  Weightbearing physical activity regularly.  Will schedule bone density at [facility].  Referral written to patient.  Princess Bruins MD, 8:45 AM  09/26/2017

## 2017-09-28 LAB — PAP IG W/ RFLX HPV ASCU

## 2018-03-04 MED FILL — MELOXICAM 15 MG TABLET: 15 | 30 days supply | Qty: 30 | Fill #0

## 2018-08-15 MED FILL — BISACODYL EC 5 MG TBEC: 5 | 100 days supply | Qty: 100 | Fill #0

## 2018-08-15 MED FILL — PEG-3350 SOLUTION: 420 | 1 days supply | Qty: 4000 | Fill #0

## 2018-09-30 ENCOUNTER — Encounter: Payer: Self-pay | Admitting: Obstetrics & Gynecology

## 2018-09-30 ENCOUNTER — Ambulatory Visit (INDEPENDENT_AMBULATORY_CARE_PROVIDER_SITE_OTHER): Payer: No Typology Code available for payment source | Admitting: Obstetrics & Gynecology

## 2018-09-30 VITALS — BP 126/84 | Ht 64.0 in | Wt 160.0 lb

## 2018-09-30 DIAGNOSIS — Z780 Asymptomatic menopausal state: Secondary | ICD-10-CM

## 2018-09-30 DIAGNOSIS — Z01419 Encounter for gynecological examination (general) (routine) without abnormal findings: Secondary | ICD-10-CM | POA: Diagnosis not present

## 2018-09-30 NOTE — Progress Notes (Signed)
Stacey Kelly 27-Jan-1956 [id number]   History:    62 y.o. 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  RP:  Established patient presenting for annual gyn exam   HPI: Menopause, well on no HRT.  No PMB.  No pelvic pain.  No pain with IC. Urine normal.  Treating constipation.  Had a Colonoscopy (benign polyp), Colon found to be tortuous.   BMI 27.46.  Breasts normal.  Health labs with Fam MD.  Past medical history,surgical history, family history and social history were all reviewed and documented in the EPIC chart.  Gynecologic History No LMP recorded. Patient is postmenopausal. Contraception: post menopausal status Last Pap: 08/2017. Results were: Negative Last mammogram: 08/2016. Results were: Negative Bone Density: Never Colonoscopy: 2019 Tortuous Colon, benign polyp  Obstetric History OB History  Gravida Para Term Preterm AB Living  2       1 1   SAB TAB Ectopic Multiple Live Births               # Outcome Date GA Lbr Len/2nd Weight Sex Delivery Anes PTL Lv  2 Gravida           1 AB              ROS: A ROS was performed and pertinent positives and negatives are included in the history.  GENERAL: No fevers or chills. HEENT: No change in vision, no earache, sore throat or sinus congestion. NECK: No pain or stiffness. CARDIOVASCULAR: No chest pain or pressure. No palpitations. PULMONARY: No shortness of breath, cough or wheeze. GASTROINTESTINAL: No abdominal pain, nausea, vomiting or diarrhea, melena or bright red blood per rectum. GENITOURINARY: No urinary frequency, urgency, hesitancy or dysuria. MUSCULOSKELETAL: No joint or muscle pain, no back pain, no recent trauma. DERMATOLOGIC: No rash, no itching, no lesions. ENDOCRINE: No polyuria, polydipsia, no heat or cold intolerance. No recent change in weight. HEMATOLOGICAL: No anemia or easy bruising or bleeding. NEUROLOGIC: No headache, seizures, numbness, tingling or weakness. PSYCHIATRIC: No  depression, no loss of interest in normal activity or change in sleep pattern.     Exam:   BP 126/84   Ht 5\' 4"  (1.626 m)   Wt 160 lb (72.6 kg)   BMI 27.46 kg/m   Body mass index is 27.46 kg/m.  General appearance : Well developed well nourished female. No acute distress HEENT: Eyes: no retinal hemorrhage or exudates,  Neck supple, trachea midline, no carotid bruits, no thyroidmegaly Lungs: Clear to auscultation, no rhonchi or wheezes, or rib retractions  Heart: Regular rate and rhythm, no murmurs or gallops Breast:Examined in sitting and supine position were symmetrical in appearance, no palpable masses or tenderness,  no skin retraction, no nipple inversion, no nipple discharge, no skin discoloration, no axillary or supraclavicular lymphadenopathy Abdomen: no palpable masses or tenderness, no rebound or guarding Extremities: no edema or skin discoloration or tenderness  Pelvic: Vulva: Normal             Vagina: No gross lesions or discharge  Cervix: No gross lesions or discharge.  Pap reflex done  Uterus  AV, normal size, shape and consistency, non-tender and mobile  Adnexa  Without masses or tenderness  Anus: Normal   Assessment/Plan:  62 y.o. female for annual exam   1. Encounter for gynecological examination with Papanicolaou smear of cervix Normal gynecologic exam in menopause.  Pap reflex done today.  Breast exam normal.  Will schedule a screening mammogram now.  Colonoscopy with benign polyp in 2019.  Will repeat colonoscopy at 5 years.  Health labs with family physician.  Body mass index 27.46.  Recommend aerobic physical activities 5 times a week and weightlifting every 2 days.  Recommend a lower calorie/carb diet such as [employer]. - Pap IG w/ reflex to HPV when ASC-U  2. Postmenopausal Menopause, well on no hormone replacement therapy.  No postmenopausal bleeding.  Vitamin D supplements with calcium intake of 1.5 g/day and regular weightbearing physical  activity recommended.  Will organize a bone density before 65.  Princess Bruins MD, 3:18 PM 09/30/2018

## 2018-10-01 LAB — PAP IG W/ RFLX HPV ASCU

## 2018-10-06 ENCOUNTER — Encounter: Payer: Self-pay | Admitting: Obstetrics & Gynecology

## 2018-10-06 NOTE — Patient Instructions (Signed)
1. Encounter for gynecological examination with Papanicolaou smear of cervix Normal gynecologic exam in menopause.  Pap reflex done today.  Breast exam normal.  Will schedule a screening mammogram now.  Colonoscopy with benign polyp in 2019.  Will repeat colonoscopy at 5 years.  Health labs with family physician.  Body mass index 27.46.  Recommend aerobic physical activities 5 times a week and weightlifting every 2 days.  Recommend a lower calorie/carb diet such as [employer]. - Pap IG w/ reflex to HPV when ASC-U  2. Postmenopausal Menopause, well on no hormone replacement therapy.  No postmenopausal bleeding.  Vitamin D supplements with calcium intake of 1.5 g/day and regular weightbearing physical activity recommended.  Will organize a bone density before 65.  Wynell, it was a pleasure seeing you today!  I will inform you of your results as soon as they are available.

## 2018-11-06 DIAGNOSIS — K5900 Constipation, unspecified: Secondary | ICD-10-CM | POA: Diagnosis not present

## 2018-11-06 DIAGNOSIS — R635 Abnormal weight gain: Secondary | ICD-10-CM | POA: Diagnosis not present

## 2018-11-06 DIAGNOSIS — Z1211 Encounter for screening for malignant neoplasm of colon: Secondary | ICD-10-CM | POA: Diagnosis not present

## 2019-01-20 DIAGNOSIS — S0501XA Injury of conjunctiva and corneal abrasion without foreign body, right eye, initial encounter: Secondary | ICD-10-CM | POA: Diagnosis not present

## 2019-01-20 DIAGNOSIS — J301 Allergic rhinitis due to pollen: Secondary | ICD-10-CM | POA: Diagnosis not present

## 2019-01-20 DIAGNOSIS — Z6828 Body mass index (BMI) 28.0-28.9, adult: Secondary | ICD-10-CM | POA: Diagnosis not present

## 2019-01-20 DIAGNOSIS — E663 Overweight: Secondary | ICD-10-CM | POA: Diagnosis not present

## 2019-01-20 DIAGNOSIS — T65811D Toxic effect of latex, accidental (unintentional), subsequent encounter: Secondary | ICD-10-CM | POA: Diagnosis not present

## 2019-01-20 DIAGNOSIS — K5900 Constipation, unspecified: Secondary | ICD-10-CM | POA: Diagnosis not present

## 2019-01-20 DIAGNOSIS — Z0000 Encounter for general adult medical examination without abnormal findings: Secondary | ICD-10-CM | POA: Diagnosis not present

## 2019-01-20 DIAGNOSIS — M8580 Other specified disorders of bone density and structure, unspecified site: Secondary | ICD-10-CM | POA: Diagnosis not present

## 2019-01-21 MED FILL — ERYTHROMYCIN EYE OINTMENT: 5 | 10 days supply | Qty: 4 | Fill #0

## 2019-01-29 DIAGNOSIS — K5900 Constipation, unspecified: Secondary | ICD-10-CM | POA: Diagnosis not present

## 2019-01-29 DIAGNOSIS — E663 Overweight: Secondary | ICD-10-CM | POA: Diagnosis not present

## 2019-01-29 DIAGNOSIS — Z131 Encounter for screening for diabetes mellitus: Secondary | ICD-10-CM | POA: Diagnosis not present

## 2019-01-29 DIAGNOSIS — Z13220 Encounter for screening for lipoid disorders: Secondary | ICD-10-CM | POA: Diagnosis not present

## 2019-01-29 DIAGNOSIS — Z0000 Encounter for general adult medical examination without abnormal findings: Secondary | ICD-10-CM | POA: Diagnosis not present

## 2019-01-29 DIAGNOSIS — M8580 Other specified disorders of bone density and structure, unspecified site: Secondary | ICD-10-CM | POA: Diagnosis not present

## 2019-01-29 DIAGNOSIS — Z6828 Body mass index (BMI) 28.0-28.9, adult: Secondary | ICD-10-CM | POA: Diagnosis not present

## 2019-01-29 DIAGNOSIS — J301 Allergic rhinitis due to pollen: Secondary | ICD-10-CM | POA: Diagnosis not present

## 2019-01-29 DIAGNOSIS — T65811D Toxic effect of latex, accidental (unintentional), subsequent encounter: Secondary | ICD-10-CM | POA: Diagnosis not present

## 2019-03-17 DIAGNOSIS — H524 Presbyopia: Secondary | ICD-10-CM | POA: Diagnosis not present

## 2019-04-07 ENCOUNTER — Other Ambulatory Visit: Payer: Self-pay | Admitting: Family Medicine

## 2019-04-07 ENCOUNTER — Other Ambulatory Visit (HOSPITAL_COMMUNITY): Payer: Self-pay | Admitting: Family Medicine

## 2019-04-07 ENCOUNTER — Other Ambulatory Visit: Payer: Self-pay

## 2019-04-07 ENCOUNTER — Ambulatory Visit: Payer: Self-pay

## 2019-04-07 DIAGNOSIS — M25511 Pain in right shoulder: Secondary | ICD-10-CM

## 2019-04-10 ENCOUNTER — Ambulatory Visit (HOSPITAL_COMMUNITY)
Admission: RE | Admit: 2019-04-10 | Discharge: 2019-04-10 | Disposition: A | Discharge status: Home / Self Care | Payer: PRIVATE HEALTH INSURANCE | Source: Ambulatory Visit | Attending: Family Medicine | Admitting: Family Medicine

## 2019-04-10 ENCOUNTER — Other Ambulatory Visit: Payer: Self-pay

## 2019-04-10 ENCOUNTER — Encounter (HOSPITAL_COMMUNITY): Payer: Self-pay | Admitting: Radiology

## 2019-04-10 DIAGNOSIS — M25511 Pain in right shoulder: Secondary | ICD-10-CM | POA: Diagnosis not present

## 2019-04-22 ENCOUNTER — Other Ambulatory Visit (HOSPITAL_COMMUNITY): Payer: Self-pay | Admitting: Ophthalmology

## 2019-04-22 ENCOUNTER — Other Ambulatory Visit: Payer: Self-pay | Admitting: Ophthalmology

## 2019-04-22 DIAGNOSIS — M25511 Pain in right shoulder: Secondary | ICD-10-CM

## 2019-05-06 ENCOUNTER — Other Ambulatory Visit: Payer: Self-pay

## 2019-05-06 ENCOUNTER — Ambulatory Visit (HOSPITAL_COMMUNITY)
Admission: RE | Admit: 2019-05-06 | Discharge: 2019-05-06 | Disposition: A | Discharge status: Home / Self Care | Payer: PRIVATE HEALTH INSURANCE | Source: Ambulatory Visit | Attending: Ophthalmology | Admitting: Ophthalmology

## 2019-05-06 DIAGNOSIS — M19011 Primary osteoarthritis, right shoulder: Secondary | ICD-10-CM | POA: Diagnosis not present

## 2019-05-06 DIAGNOSIS — M25511 Pain in right shoulder: Secondary | ICD-10-CM | POA: Insufficient documentation

## 2019-05-06 DIAGNOSIS — M67911 Unspecified disorder of synovium and tendon, right shoulder: Secondary | ICD-10-CM | POA: Diagnosis not present

## 2019-05-06 DIAGNOSIS — M75101 Unspecified rotator cuff tear or rupture of right shoulder, not specified as traumatic: Secondary | ICD-10-CM | POA: Insufficient documentation

## 2019-05-06 MED ORDER — LIDOCAINE HCL (PF) 1 % IJ SOLN
5.0000 mL | Freq: Once | INTRAMUSCULAR | Status: AC
  Administered 2019-05-06: 14:00:00 5 mL via INTRADERMAL

## 2019-05-06 MED ORDER — SODIUM CHLORIDE (PF) 0.9 % IJ SOLN
10.0000 mL | Freq: Once | INTRAMUSCULAR | Status: AC
  Administered 2019-05-06: 14:00:00 10 mL

## 2019-05-06 MED ORDER — GADOBENATE DIMEGLUMINE 529 MG/ML IV SOLN
0.1000 mL | Freq: Once | INTRAVENOUS | Status: AC | PRN
  Administered 2019-05-06: 0.1 mL via INTRA_ARTICULAR

## 2019-05-06 MED ORDER — IOHEXOL 180 MG/ML  SOLN
10.0000 mL | Freq: Once | INTRAMUSCULAR | Status: AC | PRN
  Administered 2019-05-06: 10 mL via INTRA_ARTICULAR

## 2019-05-14 DIAGNOSIS — M79671 Pain in right foot: Secondary | ICD-10-CM | POA: Diagnosis not present

## 2019-06-19 ENCOUNTER — Other Ambulatory Visit (HOSPITAL_COMMUNITY)
Admission: RE | Admit: 2019-06-19 | Discharge: 2019-06-19 | Disposition: A | Discharge status: Home / Self Care | Payer: PRIVATE HEALTH INSURANCE | Source: Ambulatory Visit | Attending: Specialist | Admitting: Specialist

## 2019-06-19 DIAGNOSIS — R011 Cardiac murmur, unspecified: Secondary | ICD-10-CM | POA: Diagnosis not present

## 2019-06-19 DIAGNOSIS — Z20828 Contact with and (suspected) exposure to other viral communicable diseases: Secondary | ICD-10-CM | POA: Insufficient documentation

## 2019-06-19 DIAGNOSIS — Z8249 Family history of ischemic heart disease and other diseases of the circulatory system: Secondary | ICD-10-CM | POA: Diagnosis not present

## 2019-06-19 DIAGNOSIS — Z91040 Latex allergy status: Secondary | ICD-10-CM | POA: Diagnosis not present

## 2019-06-19 DIAGNOSIS — F329 Major depressive disorder, single episode, unspecified: Secondary | ICD-10-CM | POA: Diagnosis not present

## 2019-06-19 DIAGNOSIS — Z79899 Other long term (current) drug therapy: Secondary | ICD-10-CM | POA: Diagnosis not present

## 2019-06-19 DIAGNOSIS — M75101 Unspecified rotator cuff tear or rupture of right shoulder, not specified as traumatic: Secondary | ICD-10-CM | POA: Diagnosis present

## 2019-06-19 DIAGNOSIS — Z809 Family history of malignant neoplasm, unspecified: Secondary | ICD-10-CM | POA: Diagnosis not present

## 2019-06-19 DIAGNOSIS — S46011A Strain of muscle(s) and tendon(s) of the rotator cuff of right shoulder, initial encounter: Secondary | ICD-10-CM | POA: Diagnosis not present

## 2019-06-19 DIAGNOSIS — Y9389 Activity, other specified: Secondary | ICD-10-CM | POA: Diagnosis not present

## 2019-06-19 DIAGNOSIS — Z01812 Encounter for preprocedural laboratory examination: Secondary | ICD-10-CM | POA: Diagnosis not present

## 2019-06-19 DIAGNOSIS — X509XXA Other and unspecified overexertion or strenuous movements or postures, initial encounter: Secondary | ICD-10-CM | POA: Diagnosis not present

## 2019-06-19 DIAGNOSIS — R51 Headache: Secondary | ICD-10-CM | POA: Diagnosis not present

## 2019-06-19 DIAGNOSIS — S43491A Other sprain of right shoulder joint, initial encounter: Secondary | ICD-10-CM | POA: Diagnosis not present

## 2019-06-19 DIAGNOSIS — E559 Vitamin D deficiency, unspecified: Secondary | ICD-10-CM | POA: Diagnosis not present

## 2019-06-19 DIAGNOSIS — M1990 Unspecified osteoarthritis, unspecified site: Secondary | ICD-10-CM | POA: Diagnosis not present

## 2019-06-19 LAB — SARS CORONAVIRUS 2 (TAT 6-24 HRS): SARS Coronavirus 2: NEGATIVE

## 2019-06-19 MED FILL — oxyCODONE HCL 5 MG TABS: 5 | 6 days supply | Qty: 40 | Fill #0

## 2019-06-19 MED FILL — METHOCARBAMOL 500 MG TABS: 500 | 5 days supply | Qty: 40 | Fill #0

## 2019-06-19 MED FILL — ONDANSETRON HCL 4 MG TABLET: 4 | 10 days supply | Qty: 40 | Fill #0

## 2019-06-19 MED FILL — CEPHALEXIN 500 MG CAPSULE: 500 | 3 days supply | Qty: 12 | Fill #0

## 2019-06-22 ENCOUNTER — Other Ambulatory Visit: Payer: Self-pay

## 2019-06-22 ENCOUNTER — Encounter (HOSPITAL_BASED_OUTPATIENT_CLINIC_OR_DEPARTMENT_OTHER): Payer: Self-pay | Admitting: *Deleted

## 2019-06-22 NOTE — Progress Notes (Signed)
Spoke w/ pt via  phone for pre-op interview.  Npo after mn w/ exception clear liquids until 1000 then nothing by mouth, pt verbalized understanding.  Arrive at 1200.  Pt had covid test done 06-19-2019.

## 2019-06-22 NOTE — H&P (View-Only) (Signed)
Spoke w/ pt via  phone for pre-op interview.  Npo after mn w/ exception clear liquids until 1000 then nothing by mouth, pt verbalized understanding.  Arrive at 1200.  Pt had covid test done 06-19-2019.

## 2019-06-23 ENCOUNTER — Encounter (HOSPITAL_BASED_OUTPATIENT_CLINIC_OR_DEPARTMENT_OTHER)
Admission: RE | Disposition: A | Discharge status: Home / Self Care | Payer: Self-pay | Source: Ambulatory Visit | Attending: Specialist

## 2019-06-23 ENCOUNTER — Ambulatory Visit (HOSPITAL_BASED_OUTPATIENT_CLINIC_OR_DEPARTMENT_OTHER): Payer: PRIVATE HEALTH INSURANCE | Admitting: Anesthesiology

## 2019-06-23 ENCOUNTER — Encounter (HOSPITAL_BASED_OUTPATIENT_CLINIC_OR_DEPARTMENT_OTHER): Payer: Self-pay

## 2019-06-23 ENCOUNTER — Ambulatory Visit (HOSPITAL_BASED_OUTPATIENT_CLINIC_OR_DEPARTMENT_OTHER)
Admission: RE | Admit: 2019-06-23 | Discharge: 2019-06-23 | Disposition: A | Discharge status: Home / Self Care | Payer: PRIVATE HEALTH INSURANCE | Source: Ambulatory Visit | Attending: Specialist | Admitting: Specialist

## 2019-06-23 DIAGNOSIS — Z91040 Latex allergy status: Secondary | ICD-10-CM | POA: Insufficient documentation

## 2019-06-23 DIAGNOSIS — S46011A Strain of muscle(s) and tendon(s) of the rotator cuff of right shoulder, initial encounter: Secondary | ICD-10-CM | POA: Insufficient documentation

## 2019-06-23 DIAGNOSIS — S43491A Other sprain of right shoulder joint, initial encounter: Secondary | ICD-10-CM | POA: Diagnosis not present

## 2019-06-23 DIAGNOSIS — R51 Headache: Secondary | ICD-10-CM | POA: Insufficient documentation

## 2019-06-23 DIAGNOSIS — X509XXA Other and unspecified overexertion or strenuous movements or postures, initial encounter: Secondary | ICD-10-CM | POA: Insufficient documentation

## 2019-06-23 DIAGNOSIS — Z79899 Other long term (current) drug therapy: Secondary | ICD-10-CM | POA: Insufficient documentation

## 2019-06-23 DIAGNOSIS — E559 Vitamin D deficiency, unspecified: Secondary | ICD-10-CM | POA: Insufficient documentation

## 2019-06-23 DIAGNOSIS — M1990 Unspecified osteoarthritis, unspecified site: Secondary | ICD-10-CM | POA: Insufficient documentation

## 2019-06-23 DIAGNOSIS — F329 Major depressive disorder, single episode, unspecified: Secondary | ICD-10-CM | POA: Diagnosis not present

## 2019-06-23 DIAGNOSIS — Y9389 Activity, other specified: Secondary | ICD-10-CM | POA: Insufficient documentation

## 2019-06-23 DIAGNOSIS — R011 Cardiac murmur, unspecified: Secondary | ICD-10-CM | POA: Insufficient documentation

## 2019-06-23 DIAGNOSIS — Z8249 Family history of ischemic heart disease and other diseases of the circulatory system: Secondary | ICD-10-CM | POA: Insufficient documentation

## 2019-06-23 DIAGNOSIS — Z809 Family history of malignant neoplasm, unspecified: Secondary | ICD-10-CM | POA: Insufficient documentation

## 2019-06-23 DIAGNOSIS — Z20828 Contact with and (suspected) exposure to other viral communicable diseases: Secondary | ICD-10-CM | POA: Insufficient documentation

## 2019-06-23 HISTORY — DX: Unspecified rotator cuff tear or rupture of right shoulder, not specified as traumatic: M75.101

## 2019-06-23 HISTORY — PX: SHOULDER ARTHROSCOPY WITH ROTATOR CUFF REPAIR: SHX5685

## 2019-06-23 SURGERY — ARTHROSCOPY, SHOULDER, WITH ROTATOR CUFF REPAIR
Anesthesia: General | Site: Shoulder | Laterality: Right

## 2019-06-23 MED ORDER — ONDANSETRON HCL 4 MG/2ML IJ SOLN
4.0000 mg | Freq: Once | INTRAMUSCULAR | Status: DC | PRN
  Filled 2019-06-23: qty 2

## 2019-06-23 MED ORDER — FENTANYL CITRATE (PF) 100 MCG/2ML IJ SOLN
25.0000 ug | INTRAMUSCULAR | Status: DC | PRN
  Filled 2019-06-23: qty 1

## 2019-06-23 MED ORDER — SUGAMMADEX SODIUM 200 MG/2ML IV SOLN
INTRAVENOUS | Status: DC | PRN
  Administered 2019-06-23: 200 mg via INTRAVENOUS

## 2019-06-23 MED ORDER — PHENYLEPHRINE 40 MCG/ML (10ML) SYRINGE FOR IV PUSH (FOR BLOOD PRESSURE SUPPORT)
PREFILLED_SYRINGE | INTRAVENOUS | Status: AC
  Filled 2019-06-23: qty 10

## 2019-06-23 MED ORDER — FENTANYL CITRATE (PF) 100 MCG/2ML IJ SOLN
INTRAMUSCULAR | Status: AC
  Filled 2019-06-23: qty 2

## 2019-06-23 MED ORDER — SODIUM CHLORIDE 0.9 % IR SOLN
Status: DC | PRN
  Administered 2019-06-23: 3000 mL
  Administered 2019-06-23: 12000 mL

## 2019-06-23 MED ORDER — ONDANSETRON HCL 4 MG/2ML IJ SOLN
INTRAMUSCULAR | Status: AC
  Filled 2019-06-23: qty 2

## 2019-06-23 MED ORDER — SCOPOLAMINE 1 MG/3DAYS TD PT72
MEDICATED_PATCH | TRANSDERMAL | Status: AC
  Filled 2019-06-23: qty 1

## 2019-06-23 MED ORDER — DEXAMETHASONE SODIUM PHOSPHATE 10 MG/ML IJ SOLN
INTRAMUSCULAR | Status: DC | PRN
  Administered 2019-06-23: 5 mg via INTRAVENOUS

## 2019-06-23 MED ORDER — ONDANSETRON HCL 4 MG/2ML IJ SOLN
INTRAMUSCULAR | Status: DC | PRN
  Administered 2019-06-23: 4 mg via INTRAVENOUS

## 2019-06-23 MED ORDER — PROPOFOL 10 MG/ML IV BOLUS
INTRAVENOUS | Status: AC
  Filled 2019-06-23: qty 40

## 2019-06-23 MED ORDER — LACTATED RINGERS IV SOLN
INTRAVENOUS | Status: DC
  Administered 2019-06-23 (×2): via INTRAVENOUS
  Filled 2019-06-23: qty 1000

## 2019-06-23 MED ORDER — ACETAMINOPHEN 500 MG PO TABS
ORAL_TABLET | ORAL | Status: AC
  Filled 2019-06-23: qty 2

## 2019-06-23 MED ORDER — PROPOFOL 10 MG/ML IV BOLUS
INTRAVENOUS | Status: DC | PRN
  Administered 2019-06-23: 200 mg via INTRAVENOUS

## 2019-06-23 MED ORDER — BUPIVACAINE LIPOSOME 1.3 % IJ SUSP
INTRAMUSCULAR | Status: DC | PRN
  Administered 2019-06-23 (×5): 2 mL via PERINEURAL

## 2019-06-23 MED ORDER — FENTANYL CITRATE (PF) 100 MCG/2ML IJ SOLN
100.0000 ug | Freq: Once | INTRAMUSCULAR | Status: AC
  Administered 2019-06-23: 100 ug via INTRAVENOUS
  Filled 2019-06-23: qty 2

## 2019-06-23 MED ORDER — MIDAZOLAM HCL 2 MG/2ML IJ SOLN
2.0000 mg | Freq: Once | INTRAMUSCULAR | Status: AC
  Administered 2019-06-23: 14:00:00 2 mg via INTRAVENOUS
  Filled 2019-06-23: qty 2

## 2019-06-23 MED ORDER — CEFAZOLIN SODIUM-DEXTROSE 2-4 GM/100ML-% IV SOLN
2.0000 g | INTRAVENOUS | Status: AC
  Administered 2019-06-23: 2 g via INTRAVENOUS
  Filled 2019-06-23: qty 100

## 2019-06-23 MED ORDER — MEPERIDINE HCL 25 MG/ML IJ SOLN
6.2500 mg | INTRAMUSCULAR | Status: DC | PRN
  Filled 2019-06-23: qty 1

## 2019-06-23 MED ORDER — KETOROLAC TROMETHAMINE 30 MG/ML IJ SOLN
INTRAMUSCULAR | Status: AC
  Filled 2019-06-23: qty 1

## 2019-06-23 MED ORDER — OXYCODONE HCL 5 MG/5ML PO SOLN
5.0000 mg | Freq: Once | ORAL | Status: DC | PRN
  Filled 2019-06-23: qty 5

## 2019-06-23 MED ORDER — PHENYLEPHRINE 40 MCG/ML (10ML) SYRINGE FOR IV PUSH (FOR BLOOD PRESSURE SUPPORT)
PREFILLED_SYRINGE | INTRAVENOUS | Status: DC | PRN
  Administered 2019-06-23: 80 ug via INTRAVENOUS
  Administered 2019-06-23: 120 ug via INTRAVENOUS

## 2019-06-23 MED ORDER — CHLORHEXIDINE GLUCONATE 4 % EX LIQD
60.0000 mL | Freq: Once | CUTANEOUS | Status: DC
  Filled 2019-06-23: qty 118

## 2019-06-23 MED ORDER — CEPHALEXIN 500 MG PO CAPS: 500.0000 mg | ORAL_CAPSULE | Freq: Four times a day (QID) | ORAL | 0 refills | Status: AC

## 2019-06-23 MED ORDER — MIDAZOLAM HCL 2 MG/2ML IJ SOLN
INTRAMUSCULAR | Status: AC
  Filled 2019-06-23: qty 2

## 2019-06-23 MED ORDER — LIDOCAINE 2% (20 MG/ML) 5 ML SYRINGE
INTRAMUSCULAR | Status: AC
  Filled 2019-06-23: qty 5

## 2019-06-23 MED ORDER — CEFAZOLIN SODIUM-DEXTROSE 2-4 GM/100ML-% IV SOLN
INTRAVENOUS | Status: AC
  Filled 2019-06-23: qty 100

## 2019-06-23 MED ORDER — ARTIFICIAL TEARS OPHTHALMIC OINT
TOPICAL_OINTMENT | OPHTHALMIC | Status: AC
  Filled 2019-06-23: qty 3.5

## 2019-06-23 MED ORDER — BUPIVACAINE-EPINEPHRINE (PF) 0.5% -1:200000 IJ SOLN
INTRAMUSCULAR | Status: DC | PRN
  Administered 2019-06-23 (×5): 3 mL via PERINEURAL

## 2019-06-23 MED ORDER — DEXAMETHASONE SODIUM PHOSPHATE 10 MG/ML IJ SOLN
INTRAMUSCULAR | Status: AC
  Filled 2019-06-23: qty 1

## 2019-06-23 MED ORDER — OXYCODONE HCL 5 MG PO TABS
5.0000 mg | ORAL_TABLET | Freq: Once | ORAL | Status: DC | PRN
  Filled 2019-06-23: qty 1

## 2019-06-23 MED ORDER — SODIUM CHLORIDE (PF) 0.9 % IJ SOLN
INTRAMUSCULAR | Status: DC | PRN
  Administered 2019-06-23: 8 mL via INTRAMUSCULAR

## 2019-06-23 MED ORDER — ACETAMINOPHEN 325 MG PO TABS
325.0000 mg | ORAL_TABLET | ORAL | Status: DC | PRN
  Filled 2019-06-23: qty 2

## 2019-06-23 MED ORDER — OXYCODONE HCL 5 MG PO TABS: 5.0000 mg | ORAL_TABLET | ORAL | 0 refills | Status: AC | PRN

## 2019-06-23 MED ORDER — METHOCARBAMOL 500 MG PO TABS: 500.0000 mg | ORAL_TABLET | Freq: Three times a day (TID) | ORAL | 0 refills | Status: AC

## 2019-06-23 MED ORDER — KETOROLAC TROMETHAMINE 15 MG/ML IJ SOLN
15.0000 mg | Freq: Once | INTRAMUSCULAR | Status: DC
  Filled 2019-06-23: qty 1

## 2019-06-23 MED ORDER — ROCURONIUM BROMIDE 10 MG/ML (PF) SYRINGE
PREFILLED_SYRINGE | INTRAVENOUS | Status: DC | PRN
  Administered 2019-06-23: 20 mg via INTRAVENOUS
  Administered 2019-06-23: 50 mg via INTRAVENOUS

## 2019-06-23 MED ORDER — ROCURONIUM BROMIDE 10 MG/ML (PF) SYRINGE
PREFILLED_SYRINGE | INTRAVENOUS | Status: AC
  Filled 2019-06-23: qty 10

## 2019-06-23 MED ORDER — ONDANSETRON HCL 4 MG PO TABS: 4.0000 mg | ORAL_TABLET | Freq: Every day | ORAL | 1 refills | Status: DC | PRN

## 2019-06-23 MED ORDER — LIDOCAINE 2% (20 MG/ML) 5 ML SYRINGE
INTRAMUSCULAR | Status: DC | PRN
  Administered 2019-06-23: 100 mg via INTRAVENOUS

## 2019-06-23 MED ORDER — ACETAMINOPHEN 160 MG/5ML PO SOLN
325.0000 mg | ORAL | Status: DC | PRN
  Filled 2019-06-23: qty 20.3

## 2019-06-23 MED FILL — CEPHALEXIN 500 MG CAPSULE: 500 | 3 days supply | Qty: 12 | Fill #0

## 2019-06-23 SURGICAL SUPPLY — 82 items
ANCHOR PEEK 4.75X19.1 SWLK C (Anchor) ×9 IMPLANT
BLADE EXCALIBUR 4.0MM X 13CM (MISCELLANEOUS) ×1
BLADE EXCALIBUR 4.0X13 (MISCELLANEOUS) ×2 IMPLANT
BLADE SURG 11 STRL SS (BLADE) ×3 IMPLANT
BLADE SURG 15 STRL LF DISP TIS (BLADE) IMPLANT
BLADE SURG 15 STRL SS (BLADE)
BNDG COHESIVE 3X5 TAN STRL LF (GAUZE/BANDAGES/DRESSINGS) ×3 IMPLANT
BURR CLEARCUT OVAL 5.5X13 (MISCELLANEOUS) ×1 IMPLANT
BURR OVAL 12 FL 5.5MM X 13CM (MISCELLANEOUS) ×1
BURR OVAL 12 FL 5.5X13 (MISCELLANEOUS) ×1
BURR OVAL 8 FLU 5.0MM X 13CM (MISCELLANEOUS) ×1
BURR OVAL 8 FLU 5.0X13 (MISCELLANEOUS) ×2 IMPLANT
CANNULA 5.75X7 CRYSTAL CLEAR (CANNULA) IMPLANT
CANNULA 5.75X71 LONG (CANNULA) IMPLANT
CANNULA TWIST IN 8.25X7CM (CANNULA) ×9 IMPLANT
CONNECTOR 5 IN 1 STRAIGHT STRL (MISCELLANEOUS) ×3 IMPLANT
COVER WAND RF STERILE (DRAPES) ×3 IMPLANT
DECANTER SPIKE VIAL GLASS SM (MISCELLANEOUS) IMPLANT
DRAPE ORTHO SPLIT 77X108 STRL (DRAPES) ×4
DRAPE POUCH INSTRU U-SHP 10X18 (DRAPES) ×3 IMPLANT
DRAPE SHEET LG 3/4 BI-LAMINATE (DRAPES) ×3 IMPLANT
DRAPE STERI 35X30 U-POUCH (DRAPES) ×3 IMPLANT
DRAPE SURG 17X23 STRL (DRAPES) ×3 IMPLANT
DRAPE SURG ORHT 6 SPLT 77X108 (DRAPES) ×2 IMPLANT
DRAPE U-SHAPE 47X51 STRL (DRAPES) ×3 IMPLANT
DURAPREP 26ML APPLICATOR (WOUND CARE) ×3 IMPLANT
DW OUTFLOW CASSETTE/TUBE SET (MISCELLANEOUS) ×3 IMPLANT
ELECT REM PT RETURN 9FT ADLT (ELECTROSURGICAL) ×3
ELECTRODE REM PT RTRN 9FT ADLT (ELECTROSURGICAL) ×1 IMPLANT
EXCALIBUR 3.8MM X 13CM (MISCELLANEOUS) ×3 IMPLANT
FIBER TAPE 2MM (SUTURE) ×3 IMPLANT
FIBERSTICK 2 (SUTURE) IMPLANT
GAUZE SPONGE 4X4 12PLY STRL (GAUZE/BANDAGES/DRESSINGS) ×3 IMPLANT
GAUZE XEROFORM 1X8 LF (GAUZE/BANDAGES/DRESSINGS) ×3 IMPLANT
GLOVE BIO SURGEON STRL SZ8 (GLOVE) ×3 IMPLANT
GLOVE INDICATOR 8.0 STRL GRN (GLOVE) ×3 IMPLANT
GOWN STRL REUS W/TWL XL LVL3 (GOWN DISPOSABLE) ×6 IMPLANT
IV NS IRRIG 3000ML ARTHROMATIC (IV SOLUTION) ×15 IMPLANT
KIT TURNOVER CYSTO (KITS) ×3 IMPLANT
LASSO SUT 90 DEGREE (SUTURE) IMPLANT
MANIFOLD NEPTUNE II (INSTRUMENTS) ×3 IMPLANT
NEEDLE 1/2 CIR CATGUT .05X1.09 (NEEDLE) IMPLANT
NEEDLE HYPO 22GX1.5 SAFETY (NEEDLE) ×3 IMPLANT
NEEDLE SCORPION MULTI FIRE (NEEDLE) ×3 IMPLANT
NS IRRIG 500ML POUR BTL (IV SOLUTION) IMPLANT
PACK ARTHROSCOPY DSU (CUSTOM PROCEDURE TRAY) ×3 IMPLANT
PACK BASIN DAY SURGERY FS (CUSTOM PROCEDURE TRAY) ×3 IMPLANT
PAD ABD 8X10 STRL (GAUZE/BANDAGES/DRESSINGS) ×3 IMPLANT
PAD ARMBOARD 7.5X6 YLW CONV (MISCELLANEOUS) IMPLANT
PENCIL BUTTON HOLSTER BLD 10FT (ELECTRODE) IMPLANT
PORT APPOLLO RF 90DEGREE MULTI (SURGICAL WAND) ×3 IMPLANT
POUCH LAPAROSCOPIC INSTRUMENT (MISCELLANEOUS) ×3 IMPLANT
SLEEVE ARM SUSPENSION SYSTEM (MISCELLANEOUS) ×3 IMPLANT
SLING ARM IMMOBILIZER MED (SOFTGOODS) ×3 IMPLANT
SLING S3 LATERAL DISP (MISCELLANEOUS) IMPLANT
SLING ULTRA II L (ORTHOPEDIC SUPPLIES) IMPLANT
SLING ULTRA II S (ORTHOPEDIC SUPPLIES) ×3 IMPLANT
SPONGE LAP 4X18 RFD (DISPOSABLE) IMPLANT
SUT 2 FIBERLOOP 20 STRT BLUE (SUTURE)
SUT ETHILON 3 0 PS 1 (SUTURE) ×3 IMPLANT
SUT FIBERWIRE #2 38 T-5 BLUE (SUTURE) ×3
SUT LASSO 45 DEGREE LEFT (SUTURE) IMPLANT
SUT LASSO 45D RIGHT (SUTURE) IMPLANT
SUT PDS AB 0 CT1 36 (SUTURE) IMPLANT
SUT TIGER TAPE 7 IN WHITE (SUTURE) IMPLANT
SUT VIC AB 0 CT1 36 (SUTURE) IMPLANT
SUT VIC AB 2-0 CT1 27 (SUTURE)
SUT VIC AB 2-0 CT1 TAPERPNT 27 (SUTURE) IMPLANT
SUTURE 2 FIBERLOOP 20 STRT BLU (SUTURE) IMPLANT
SUTURE FIBERWR #2 38 T-5 BLUE (SUTURE) ×1 IMPLANT
SUTURE TAPE 1.3 40 TPR END (SUTURE) ×2 IMPLANT
SUTURETAPE 1.3 40 TPR END (SUTURE) ×6
SYR CONTROL 10ML LL (SYRINGE) ×3 IMPLANT
SYR TB 1ML 27GX1/2 SAFE (SYRINGE) ×1 IMPLANT
SYR TB 1ML 27GX1/2 SAFETY (SYRINGE) ×2
TAPE CLOTH SURG 6X10 WHT LF (GAUZE/BANDAGES/DRESSINGS) ×3 IMPLANT
TAPE FIBER 2MM 7IN #2 BLUE (SUTURE) IMPLANT
TOWEL OR 17X26 10 PK STRL BLUE (TOWEL DISPOSABLE) ×3 IMPLANT
TUBE CONNECTING 12'X1/4 (SUCTIONS) ×1
TUBE CONNECTING 12X1/4 (SUCTIONS) ×2 IMPLANT
TUBING ARTHROSCOPY IRRIG 16FT (MISCELLANEOUS) ×3 IMPLANT
WATER STERILE IRR 500ML POUR (IV SOLUTION) IMPLANT

## 2019-06-23 NOTE — Anesthesia Procedure Notes (Signed)
Procedure Name: Intubation Date/Time: 06/23/2019 2:02 PM Performed by: Wanita Chamberlain, CRNA Pre-anesthesia Checklist: Patient identified, Emergency Drugs available, Suction available and Patient being monitored Patient Re-evaluated:Patient Re-evaluated prior to induction Oxygen Delivery Method: Circle system utilized Preoxygenation: Pre-oxygenation with 100% oxygen Induction Type: IV induction Ventilation: Mask ventilation without difficulty Laryngoscope Size: Mac and 3 Grade View: Grade I Tube type: Oral Number of attempts: 1 Placement Confirmation: breath sounds checked- equal and bilateral,  CO2 detector,  positive ETCO2 and ETT inserted through vocal cords under direct vision Secured at: 21 cm Tube secured with: Tape Dental Injury: Teeth and Oropharynx as per pre-operative assessment

## 2019-06-23 NOTE — Discharge Instructions (Signed)
°  Post Anesthesia Home Care Instructions  Activity: Get plenty of rest for the remainder of the day. A responsible individual must stay with you for 24 hours following the procedure.  For the next 24 hours, DO NOT: -Drive a car -Paediatric nurse -Drink alcoholic beverages -Take any medication unless instructed by your physician -Make any legal decisions or sign important papers.  Meals: Start with liquid foods such as gelatin or soup. Progress to regular foods as tolerated. Avoid greasy, spicy, heavy foods. If nausea and/or vomiting occur, drink only clear liquids until the nausea and/or vomiting subsides. Call your physician if vomiting continues.  Special Instructions/Symptoms: Your throat may feel dry or sore from the anesthesia or the breathing tube placed in your throat during surgery. If this causes discomfort, gargle with warm salt water. The discomfort should disappear within 24 hours.  If you had a scopolamine patch placed behind your ear for the management of post- operative nausea and/or vomiting:  1. The medication in the patch is effective for 72 hours, after which it should be removed.  Wrap patch in a tissue and discard in the trash. Wash hands thoroughly with soap and water. 2. You may remove the patch earlier than 72 hours if you experience unpleasant side effects which may include dry mouth, dizziness or visual disturbances. 3. Avoid touching the patch. Wash your hands with soap and water after contact with the patch.    Regional Anesthesia Blocks  1. Numbness or the inability to move the "blocked" extremity may last from 3-48 hours after placement. The length of time depends on the medication injected and your individual response to the medication. If the numbness is not going away after 48 hours, call your surgeon.  2. The extremity that is blocked will need to be protected until the numbness is gone and the strength has returned. Because you cannot feel it, you will  need to take extra care to avoid injury. Because it may be weak, you may have difficulty moving it or using it. You may not know what position it is in without looking at it while the block is in effect.  3. For blocks in the legs and feet, returning to weight bearing and walking needs to be done carefully. You will need to wait until the numbness is entirely gone and the strength has returned. You should be able to move your leg and foot normally before you try and bear weight or walk. You will need someone to be with you when you first try to ensure you do not fall and possibly risk injury.  4. Bruising and tenderness at the needle site are common side effects and will resolve in a few days.  5. Persistent numbness or new problems with movement should be communicated to the surgeon or the [facility] [phone]).    Call your surgeon if you experience:   1.  Fever over 101.0. 2.  Inability to urinate. 3.  Nausea and/or vomiting. 4.  Extreme swelling or bruising at the surgical site. 5.  Continued bleeding from the incision. 6.  Increased pain, redness or drainage from the incision. 7.  Problems related to your pain medication. 8.  Any problems and/or concerns. 9. Any changes in color, movement, or sensation to right arm after numbness wears off.

## 2019-06-23 NOTE — H&P (Signed)
Stacey Kelly is an 63 y.o. female.   Chief Complaint: Right shoulder pain HPI: This is a very pleasant 63 year old female who is here today for right shoulder pain.  Back in June of this year she had a known injury while at work to this right shoulder.  She was moving a patient from the bed and she felt a searing pain in this right shoulder.  She has had a previous surgery to this right shoulder, rotator cuff repair.  Patient had an MRI scan that showed a right subscap tear.  Patient was seen in the office and consented for surgical management today.  She is going to have a right shoulder arthroscopy, biceps tenotomy versus tenodesis and rotator cuff repair.  Risks and benefits of surgery were discussed with the patient at her preop appointment.  Past Medical History:  Diagnosis Date  . Depression   . Heart murmur    ASYMPTOMATIC--  ECHO  1995  MILD REGURG  . Migraine   . OA (osteoarthritis)    shoulders  . PONV (postoperative nausea and vomiting)   . Rotator cuff tear, right   . Vitamin D deficiency   . Wears glasses     Past Surgical History:  Procedure Laterality Date  . BUNIONECTOMY Right 2004   BOTH SIDES OF FOOT  . CRYOTHERAPY    . HARDWARE REMOVAL Right 04/15/2014   Procedure: EXCISION OF RIGHT FOOT DORSAL HARDWARE;  Surgeon: Wylene Simmer, MD;  Location: [facility];  Service: Orthopedics;  Laterality: Right;  . RIGHT SHOULDER SURGERY  X3  LAST ONE 2010   INCLUDING ROTATOR CUFF REPAIR /  RECONSTRUCTION  . SHOULDER ARTHROSCOPY WITH ROTATOR CUFF REPAIR Left 03/18/2014   Procedure: LEFT SHOULDER ARTHROSCOPY WITH EVALUATION UNDER ANESTHESIA DEBRIDEMENT SUBACROMIAL DECOMPRESSION DISTAL CLAVICAL RESECTION ROTATOR CUFF REPAIR BICEPS  TENDINOSIS;  Surgeon: Sydnee Cabal, MD;  Location: [facility];  Service: Orthopedics;  Laterality: Left;  . TONSILLECTOMY  AGE 70    Family History  Problem Relation Age of Onset  . Pulmonary embolism Mother   . Heart  disease Father   . Cancer Sister    Social History:  reports that she has never smoked. She has never used smokeless tobacco. She reports previous alcohol use. She reports that she does not use drugs.  Allergies:  Allergies  Allergen Reactions  . Latex Anaphylaxis    Medications Prior to Admission  Medication Sig Dispense Refill  . Ascorbic Acid (VITAMIN C) 1000 MG tablet Take 1,000 mg by mouth daily.    . Calcium Carb-Cholecalciferol (CALCIUM 600 + D PO) Take 1 tablet by mouth 2 (two) times daily.    . cetirizine (ZYRTEC) 10 MG tablet Take 10 mg by mouth every evening.    . Cholecalciferol (VITAMIN D3) 2000 UNITS TABS Take 1 capsule by mouth daily.    . fluticasone (FLONASE) 50 MCG/ACT nasal spray Place 1 spray into both nostrils at bedtime.     . Multiple Vitamin (MULTIVITAMIN) tablet Take 1 tablet by mouth daily.    . Zinc 50 MG TABS Take by mouth daily.      No results found for this or any previous visit (from the past 48 hour(s)). No results found.  Review of Systems  Constitutional: Negative.   Respiratory: Negative.   Cardiovascular: Negative.   Gastrointestinal: Negative.   Musculoskeletal: Positive for joint pain.  Skin: Negative.     Blood pressure 131/67, pulse 94, temperature 98.5 F (36.9 C), temperature source Oral,  resp. rate (!) 30, height 5' 5.5" (1.664 m), weight 76 kg, SpO2 98 %. Physical Exam  Constitutional: She is oriented to person, place, and time. She appears well-developed and well-nourished. No distress.  HENT:  Head: Normocephalic and atraumatic.  Eyes: Pupils are equal, round, and reactive to light. Conjunctivae and EOM are normal.  Neck: Normal range of motion.  Musculoskeletal:     Comments: Limited ROM of right shoulder. Weakness of right shoulder with forward flexion and abduction. NVI in right upper extremity.  Neurological: She is alert and oriented to person, place, and time.  Skin: Skin is warm and dry. She is not diaphoretic.      Assessment/Plan 1.  Right rotator cuff tear -Patient is having surgery on August 25 for a right shoulder arthroscopy, rotator cuff repair, biceps tenotomy versus tenodesis.  Patient was previously explained the risks and benefits of surgery.  All questions were answered for the patient today prior to procedure.  Medications were sent to the pharmacy today.  She will follow-up in our office in 2 weeks.   Drue Novel, PA 06/23/2019, 1:34 PM

## 2019-06-23 NOTE — Anesthesia Procedure Notes (Signed)
Anesthesia Regional Block: Interscalene brachial plexus block   Pre-Anesthetic Checklist: ,, timeout performed, Correct Patient, Correct Site, Correct Laterality, Correct Procedure, Correct Position, site marked, Risks and benefits discussed,  Surgical consent,  Pre-op evaluation,  At surgeon's request and post-op pain management  Laterality: Right and Upper  Prep: chloraprep       Needles:  Injection technique: Single-shot  Needle Type: Echogenic Stimulator Needle     Needle Length: 5cm  Needle Gauge: 21   Needle insertion depth: 1 cm   Additional Needles:   Procedures:,,,, ultrasound used (permanent image in chart),,,,  Narrative:  Start time: 06/23/2019 1:15 PM End time: 06/23/2019 1:25 PM Injection made incrementally with aspirations every 5 mL.  Performed by: Personally  Anesthesiologist: Lyn Hollingshead, MD

## 2019-06-23 NOTE — Transfer of Care (Signed)
Immediate Anesthesia Transfer of Care Note  Patient: Stacey Kelly  Procedure(s) Performed: Procedure(s) (LRB): SHOULDER ARTHROSCOPY WITH ROTATOR CUFF REPAIR , biceps tenotomy (Right)  Patient Location: PACU  Anesthesia Type: General  Level of Consciousness: awake, sedated, patient cooperative and responds to stimulation  Airway & Oxygen Therapy: Patient Spontanous Breathing and Patient connected to [place] and soft face mask   Post-op Assessment: Report given to PACU RN, Post -op Vital signs reviewed and stable and Patient moving all extremities  Post vital signs: Reviewed and stable  Complications: No apparent anesthesia complications

## 2019-06-23 NOTE — Anesthesia Postprocedure Evaluation (Signed)
Anesthesia Post Note  Patient: Nariya A Sopko  Procedure(s) Performed: SHOULDER ARTHROSCOPY WITH ROTATOR CUFF REPAIR , biceps tenotomy (Right Shoulder)     Patient location during evaluation: PACU Anesthesia Type: General Level of consciousness: awake and alert Pain management: pain level controlled Vital Signs Assessment: post-procedure vital signs reviewed and stable Respiratory status: spontaneous breathing, nonlabored ventilation, respiratory function stable and patient connected to nasal cannula oxygen Cardiovascular status: blood pressure returned to baseline and stable Postop Assessment: no apparent nausea or vomiting Anesthetic complications: no    Last Vitals:  Vitals:   06/23/19 1630 06/23/19 1645  BP: 118/65 114/68  Pulse: 73 79  Resp: 16 18  Temp:    SpO2: 93% 93%    Last Pain:  Vitals:   06/23/19 1645  TempSrc:   PainSc: 0-No pain                 Sherman Donaldson DAVID

## 2019-06-23 NOTE — Interval H&P Note (Signed)
History and Physical Interval Note:  06/23/2019 1:45 PM  Stacey Kelly  has presented today for surgery, with the diagnosis of Right shoulder rotator cuff tear.  The various methods of treatment have been discussed with the patient and family. After consideration of risks, benefits and other options for treatment, the patient has consented to  Procedure(s): SHOULDER ARTHROSCOPY WITH ROTATOR CUFF REPAIR biceps tenotomy vs tenodesis (Right) as a surgical intervention.  The patient's history has been reviewed, patient examined, no change in status, stable for surgery.  I have reviewed the patient's chart and labs.  Questions were answered to the patient's satisfaction.     [name] ANDREW

## 2019-06-23 NOTE — Anesthesia Preprocedure Evaluation (Addendum)
Anesthesia Evaluation  Patient identified by MRN, date of birth, ID band Patient awake    Reviewed: Allergy & Precautions, NPO status , Patient's Chart, lab work & pertinent test results  History of Anesthesia Complications (+) PONV and history of anesthetic complications  Airway Mallampati: I  TM Distance: >3 FB Neck ROM: Full    Dental no notable dental hx. (+) Teeth Intact, Dental Advisory Given,    Pulmonary neg pulmonary ROS,    Pulmonary exam normal breath sounds clear to auscultation       Cardiovascular negative cardio ROS Normal cardiovascular exam Rhythm:Regular Rate:Normal     Neuro/Psych  Headaches, PSYCHIATRIC DISORDERS Depression    GI/Hepatic negative GI ROS, Neg liver ROS,   Endo/Other  negative endocrine ROS  Renal/GU negative Renal ROS  negative genitourinary   Musculoskeletal  (+) Arthritis , Osteoarthritis,    Abdominal Normal abdominal exam  (+)   Peds negative pediatric ROS (+)  Hematology negative hematology ROS (+)   Anesthesia Other Findings   Reproductive/Obstetrics negative OB ROS                            Anesthesia Physical Anesthesia Plan  ASA: II  Anesthesia Plan: General   Post-op Pain Management:  Regional for Post-op pain   Induction: Intravenous  PONV Risk Score and Plan: 4 or greater and Ondansetron, Dexamethasone, Midazolam and Scopolamine patch - Pre-op  Airway Management Planned: Oral ETT  Additional Equipment: None  Intra-op Plan:   Post-operative Plan: Extubation in OR  Informed Consent: I have reviewed the patients History and Physical, chart, labs and discussed the procedure including the risks, benefits and alternatives for the proposed anesthesia with the patient or authorized representative who has indicated his/her understanding and acceptance.     Dental advisory given  Plan Discussed with: CRNA and  Anesthesiologist  Anesthesia Plan Comments:        Anesthesia Quick Evaluation

## 2019-06-23 NOTE — Op Note (Signed)
NAME: Stacey Kelly, Stacey Kelly MEDICAL RECORD [id number] ACCOUNT [id number] DATE OF BIRTH:06/27/1956 FACILITY: [facility] LOCATION: [facility] PHYSICIAN:Zsazsa Bahena A. Latriece Anstine, MD  OPERATIVE REPORT  DATE OF PROCEDURE:  06/23/2019  PREOPERATIVE DIAGNOSES:  Right shoulder rotator cuff tear, subscapularis, with medial subluxation of biceps.  POSTOPERATIVE DIAGNOSES: 1.  Right shoulder subscapularis tear, medial dislocation of biceps tendon with marked biceps tendinopathy. 2.  Extensive tear to the supraspinatus tendon. 3.  Type 1 labral tearing.  PROCEDURE: 1.  Right shoulder arthroscopic rotator cuff repair, subscapularis and supraspinatus. 2.  Biceps tenotomy. 3.  Labral debridement.    SURGEON:  Cynda Familia, MD  ASSISTANT:  Elizabeth Sauer, PA-C  ANESTHESIA:  Interscalene block general.  ESTIMATED BLOOD LOSS:  Minimal.  DRAINS:  None.  COMPLICATIONS:  None.  DISPOSITION:  PACU, stable.  DESCRIPTION OF PROCEDURE:  The patient was counseled in the holding area.  Correct site was identified, marked, and signed appropriately.  IV started, sedation given.  A block was administered.  IV antibiotics were given.  She was then taken to the  operating room and placed in position, general anesthesia.  Following that, she was gently placed into a left lateral decubitus position, properly padded and a bump.  Right shoulder examined for range of motion, prepped with DuraPrep and draped in  sterile fashion.  We utilized the Arthrex sterile shoulder holder at 30 degrees of abduction, 10 degrees forward flexion, and 15 pounds of longitudinal traction.  The patient's shoulder did not over distract the operative extremity.  After time-out, a  posterior portal was created.  Arthroscope was initially placed in the subacromial region where I immediately identified the supraspinatus tear which actually had incorporated a previous portion of the previous repair I had done several years ago.  It  was debrided  back to healthy tissue, and a slight tuberoplasty was performed getting down to a bleeding response.  The scope was then placed intraarticularly where she was noted to have a tear of the subscapularis with retraction medially and a  dislocated biceps tendon with marked biceps tendinopathy and type 1 superior labral tear into the rotator cuff tear portal, making sure that we protected all neurovascular structures, including the axillary nerve.  A biceps tenotomy was performed with  cautery system.  [place] was then utilized and cautery to do a labral debridement.  I then mobilized the subscapularis, placed an Arthrex FiberTape suture through this, mobilized it.  A portal was made anteriorly and put into a SwiveLock to repair it back to the medial  aspect of the bicipital groove and the lesser tuberosity.  I then supplemented that with more sutures more proximal.  Ankle was placed in the greater tuberosity.  Four limbs of suture placed supraspinatus.  The arm was abducted and there were no SwiveLocks in the suture bridge, double-row technique.  At this point in time, we repaired the subscapularis and the  supraspinatus back down to anatomic landmarks.  Biceps tenotomy had been performed.  The labrum had been debrided.  There was no intraarticular arthrosis noted.  She also previously had undergone a decompression.  She was well decompressed.  The [facility] joint was left alone.  I did do a slight subacromial-subdeltoid bursectomy for visualization.  No complications or problems.  She was taken out of traction.  Normal  pulses.  Portals closed with 4-0 suture.  Sterile dressing applied along with abduction sling, turned supine, awakened.  Taken from the operating room to the PACU in stable condition.  She will  be stabilized in the PACU, discharged home.  Start therapy  next week, standard protocol.  No biceps lifting for 8 weeks, and she will be seen back in the office in 2 weeks.  To help with patient  positioning, prepping, draping, technical and surgical assistance throughout the entire case, wound closure, application of splint and sling, Ms. Elizabeth Sauer, assistant, was needed throughout the entire case.  LN/NUANCE  D:06/23/2019 T:06/23/2019 JOB:[id number]

## 2019-06-23 NOTE — Progress Notes (Signed)
Assisted Dr. Hatchett with right, ultrasound guided, interscalene  block. Side rails up, monitors on throughout procedure. See vital signs in flow sheet. Tolerated Procedure well.  

## 2019-06-23 NOTE — Op Note (Signed)
[id number]

## 2019-06-24 ENCOUNTER — Encounter (HOSPITAL_BASED_OUTPATIENT_CLINIC_OR_DEPARTMENT_OTHER): Payer: Self-pay | Admitting: Specialist

## 2019-06-29 ENCOUNTER — Other Ambulatory Visit: Payer: Self-pay

## 2019-06-29 ENCOUNTER — Encounter: Payer: Self-pay | Admitting: Physical Therapy

## 2019-06-29 ENCOUNTER — Ambulatory Visit: Payer: PRIVATE HEALTH INSURANCE | Attending: Specialist | Admitting: Physical Therapy

## 2019-06-29 DIAGNOSIS — R600 Localized edema: Secondary | ICD-10-CM | POA: Diagnosis present

## 2019-06-29 DIAGNOSIS — M25611 Stiffness of right shoulder, not elsewhere classified: Secondary | ICD-10-CM | POA: Diagnosis present

## 2019-06-29 DIAGNOSIS — M25511 Pain in right shoulder: Secondary | ICD-10-CM | POA: Diagnosis present

## 2019-06-29 NOTE — Therapy (Signed)
Stacey Kelly, [place], [address] Phone: [phone]   Fax:  [phone]  Physical Therapy Evaluation  Patient Details  Name: Stacey Kelly MRN: [id number] Date of Birth: 1955/12/16 Referring Provider (PT): RA Theda Sers   Encounter Date: 06/29/2019  PT End of Session - 06/29/19 1005    Visit Number  1    Number of Visits  13    Authorization Type  W/C    PT Start Time  0935    PT Stop Time  1010    PT Time Calculation (min)  35 min    Activity Tolerance  Patient tolerated treatment well;Treatment limited secondary to medical complications (Comment)    Behavior During Therapy  [facility] for tasks assessed/performed       Past Medical History:  Diagnosis Date  . Depression   . Heart murmur    ASYMPTOMATIC--  ECHO  1995  MILD REGURG  . Migraine   . OA (osteoarthritis)    shoulders  . PONV (postoperative nausea and vomiting)   . Rotator cuff tear, right   . Vitamin D deficiency   . Wears glasses     Past Surgical History:  Procedure Laterality Date  . BUNIONECTOMY Right 2004   BOTH SIDES OF FOOT  . CRYOTHERAPY    . HARDWARE REMOVAL Right 04/15/2014   Procedure: EXCISION OF RIGHT FOOT DORSAL HARDWARE;  Surgeon: Wylene Simmer, MD;  Location: [place];  Service: Orthopedics;  Laterality: Right;  . RIGHT SHOULDER SURGERY  X3  LAST ONE 2010   INCLUDING ROTATOR CUFF REPAIR /  RECONSTRUCTION  . SHOULDER ARTHROSCOPY WITH ROTATOR CUFF REPAIR Left 03/18/2014   Procedure: LEFT SHOULDER ARTHROSCOPY WITH EVALUATION UNDER ANESTHESIA DEBRIDEMENT SUBACROMIAL DECOMPRESSION DISTAL CLAVICAL RESECTION ROTATOR CUFF REPAIR BICEPS  TENDINOSIS;  Surgeon: Sydnee Cabal, MD;  Location: [place];  Service: Orthopedics;  Laterality: Left;  . SHOULDER ARTHROSCOPY WITH ROTATOR CUFF REPAIR Right 06/23/2019   Procedure: SHOULDER ARTHROSCOPY WITH ROTATOR CUFF REPAIR , biceps tenotomy;  Surgeon:  Sydnee Cabal, MD;  Location: [facility];  Service: Orthopedics;  Laterality: Right;  . TONSILLECTOMY  AGE 63    There were no vitals filed for this visit.   Subjective Assessment - 06/29/19 0937    Subjective  Patient reports that she hurt her right arm a few months ago moving a patient, she had a RC repair on 06/23/19, She reports that she had two tears and a Tenotomy    Limitations  Lifting;House hold activities    Patient Stated Goals  get back to work    Currently in Pain?  Yes    Pain Score  2     Pain Location  Shoulder    Pain Orientation  Left;Anterior    Pain Descriptors / Indicators  Aching    Pain Type  Acute pain;Surgical pain    Pain Radiating Towards  denies    Pain Onset  More than a month ago    Pain Frequency  Constant    Aggravating Factors   pain up to 7/10 with pain meds wearing off, trying to sleep    Pain Relieving Factors  pain meds, ice and in sling pain can be a 2/10    Effect of Pain on Daily Activities  not able to work, difficulty with all ADL's         [facility] PT Assessment - 06/29/19 0001      Assessment  Medical Diagnosis  s/p right shoulder RC repair    Referring Provider (PT)  RA Collins    Onset Date/Surgical Date  06/23/19    Hand Dominance  Right      Precautions   Precaution Comments  RC repair protocol      Balance Screen   Has the patient fallen in the past 6 months  No    Has the patient had a decrease in activity level because of a fear of falling?   No    Is the patient reluctant to leave their home because of a fear of falling?   No      Home Environment   Additional Comments  does housework      Prior Function   Level of Independence  Independent    Vocation  Full time employment    Occupational psychologist in ICU, heavy lifting at times    Leisure  walking and gardening      Posture/Postural Control   Posture Comments  fwd head, rounded shoulders, guarded and in sling      ROM / Strength   AROM /  PROM / Strength  PROM      PROM   Overall PROM Comments  Good ROM of the elbow and hand    PROM Assessment Site  Shoulder    Right/Left Shoulder  Right    Right Shoulder Flexion  90 Degrees    Right Shoulder ABduction  80 Degrees    Right Shoulder Internal Rotation  30 Degrees    Right Shoulder External Rotation  6 Degrees      Palpation   Palpation comment   a lot of swelling, some bruising, she is very tight in the paectoral and the biceps area                Objective measurements completed on examination: See above findings.              PT Education - 06/29/19 1005    Education Details  HEP for shrugs, scapular retraction, pendulums, arm slides for flexion and table ER    Person(s) Educated  Patient    Methods  Explanation;Demonstration;Handout;Verbal cues    Comprehension  Verbalized understanding       PT Short Term Goals - 06/29/19 1008      PT SHORT TERM GOAL #1   Title  independent with initial HEP    Time  2    Period  Weeks    Status  New        PT Long Term Goals - 06/29/19 1008      PT LONG TERM GOAL #1   Title  decrease pain 50%    Time  8    Period  Weeks    Status  New      PT LONG TERM GOAL #2   Title  AROM of the right shoulder to WFL's    Time  12    Period  Weeks    Status  New      PT LONG TERM GOAL #3   Title  report no difficulty with dressing and doing hair    Time  8    Period  Weeks    Status  New      PT LONG TERM GOAL #4   Title  sleep without pain down the arm    Time  8    Period  Weeks    Status  New  PT LONG TERM GOAL #5   Title  lift 5# overhead with right arm    Time  12    Period  Weeks    Status  New             Plan - 06/29/19 1006    Clinical Impression Statement  Patient injured her right shoulder about two months ago, moving a patient at work, an MRI revealed RC tear, she underwent a RC repair with tenotomny on 06/23/19.  She is now in a sling and not working.  She is limited  with ROM, has pain and guarding.    Stability/Clinical Decision Making  Evolving/Moderate complexity    Clinical Decision Making  Low    Rehab Potential  Good    PT Frequency  3x / week    PT Duration  8 weeks    PT Treatment/Interventions  ADLs/Self Care Home Management;Cryotherapy;Electrical Stimulation;Therapeutic activities;Therapeutic exercise;Patient/family education;Manual techniques;Vasopneumatic Device    PT Next Visit Plan  slowly atart ROM, follow RC repair protocol    Consulted and Agree with Plan of Care  Patient       Patient will benefit from skilled therapeutic intervention in order to improve the following deficits and impairments:  Pain, Postural dysfunction, Increased muscle spasms, Decreased scar mobility, Decreased activity tolerance, Decreased range of motion, Decreased strength, Impaired UE functional use, Impaired flexibility, Increased edema  Visit Diagnosis: Acute pain of right shoulder - Plan: PT plan of care cert/re-cert  Stiffness of right shoulder, not elsewhere classified - Plan: PT plan of care cert/re-cert  Localized edema - Plan: PT plan of care cert/re-cert     Problem List Patient Active Problem List   Diagnosis Date Noted  . S/P rotator cuff repair 03/18/2014    Sumner Boast., PT 06/29/2019, 10:17 AM  [address] [place], [place], [address] Phone: [phone]   Fax:  [phone]  Name: AERIELLE WEINHOLD MRN: [id number] Date of Birth: 05/25/1956

## 2019-07-01 ENCOUNTER — Other Ambulatory Visit: Payer: Self-pay

## 2019-07-01 ENCOUNTER — Ambulatory Visit: Payer: PRIVATE HEALTH INSURANCE | Attending: Specialist | Admitting: Physical Therapy

## 2019-07-01 DIAGNOSIS — M25611 Stiffness of right shoulder, not elsewhere classified: Secondary | ICD-10-CM | POA: Diagnosis present

## 2019-07-01 DIAGNOSIS — M25511 Pain in right shoulder: Secondary | ICD-10-CM | POA: Insufficient documentation

## 2019-07-01 DIAGNOSIS — R600 Localized edema: Secondary | ICD-10-CM | POA: Insufficient documentation

## 2019-07-01 NOTE — Therapy (Signed)
[place], [place], [address] Phone: [phone]   Fax:  [phone]  Physical Therapy Treatment  Patient Details  Name: Stacey Kelly MRN: [id number] Date of Birth: [birth year] Referring Provider (PT): RA Theda Sers   Encounter Date: 07/01/2019  PT End of Session - 07/01/19 1019    Visit Number  2    Number of Visits  13    PT Start Time  0932    PT Stop Time  [id number]    PT Time Calculation (min)  55 min       Past Medical History:  Diagnosis Date  . Depression   . Heart murmur    ASYMPTOMATIC--  ECHO  1995  MILD REGURG  . Migraine   . OA (osteoarthritis)    shoulders  . PONV (postoperative nausea and vomiting)   . Rotator cuff tear, right   . Vitamin D deficiency   . Wears glasses     Past Surgical History:  Procedure Laterality Date  . BUNIONECTOMY Right 2004   BOTH SIDES OF FOOT  . CRYOTHERAPY    . HARDWARE REMOVAL Right 04/15/2014   Procedure: EXCISION OF RIGHT FOOT DORSAL HARDWARE;  Surgeon: Wylene Simmer, MD;  Location: [place];  Service: Orthopedics;  Laterality: Right;  . RIGHT SHOULDER SURGERY  X3  LAST ONE 2010   INCLUDING ROTATOR CUFF REPAIR /  RECONSTRUCTION  . SHOULDER ARTHROSCOPY WITH ROTATOR CUFF REPAIR Left 03/18/2014   Procedure: LEFT SHOULDER ARTHROSCOPY WITH EVALUATION UNDER ANESTHESIA DEBRIDEMENT SUBACROMIAL DECOMPRESSION DISTAL CLAVICAL RESECTION ROTATOR CUFF REPAIR BICEPS  TENDINOSIS;  Surgeon: Sydnee Cabal, MD;  Location: [place];  Service: Orthopedics;  Laterality: Left;  . SHOULDER ARTHROSCOPY WITH ROTATOR CUFF REPAIR Right 06/23/2019   Procedure: SHOULDER ARTHROSCOPY WITH ROTATOR CUFF REPAIR , biceps tenotomy;  Surgeon: Sydnee Cabal, MD;  Location: [facility];  Service: Orthopedics;  Laterality: Right;  . TONSILLECTOMY  AGE 31    There were no vitals filed for this visit.  Subjective Assessment - 07/01/19 0932     Subjective  very stiff in morning, one pill for pain and muscle relaxant at night. slept in bed last night for the first time, no waking of pain. yesterday was first day she didnt nap and has been walking around the park.    Pain Score  2     Pain Location  Shoulder    Pain Orientation  Right                       [facility] Adult PT Treatment/Exercise - 07/01/19 0001      Exercises   Exercises  Shoulder      Shoulder Exercises: Seated   Elevation  AROM;Both;10 reps   2 sets   Retraction  AAROM;AROM;Both;10 reps   2 sets   Protraction  AROM;Both;10 reps   2 sets   Other Seated Exercises  AROM 2x10 R bicep curl     Shoulder Exercises: ROM/Strengthening   Other ROM/Strengthening Exercises  PROM of R shoulder on physio ball guided by Left      Modalities   Modalities  Cryotherapy      Cryotherapy   Number Minutes Cryotherapy  10 Minutes    Cryotherapy Location  Shoulder   RT   Type of Cryotherapy  Ice pack      Manual Therapy   Manual Therapy  Passive ROM    Manual  therapy comments  all motions of the R shoulder with stretch at end range guarding with ER, ABD, and Flxn              PT Short Term Goals - 06/29/19 1008      PT SHORT TERM GOAL #1   Title  independent with initial HEP    Time  2    Period  Weeks    Status  New        PT Long Term Goals - 06/29/19 1008      PT LONG TERM GOAL #1   Title  decrease pain 50%    Time  8    Period  Weeks    Status  New      PT LONG TERM GOAL #2   Title  AROM of the right shoulder to WFL's    Time  12    Period  Weeks    Status  New      PT LONG TERM GOAL #3   Title  report no difficulty with dressing and doing hair    Time  8    Period  Weeks    Status  New      PT LONG TERM GOAL #4   Title  sleep without pain down the arm    Time  8    Period  Weeks    Status  New      PT LONG TERM GOAL #5   Title  lift 5# overhead with right arm    Time  12    Period  Weeks    Status  New             Plan - 07/01/19 1020    Clinical Impression Statement  Patient tolerated treatment well, Guarding present thoughout ROM but able to relax after holding end range. Pt. will continue to benefit from PT to promote ROM, and functional use of surrounding muscles around [facility]    PT Treatment/Interventions  ADLs/Self Care Home Management;Cryotherapy;Electrical Stimulation;Therapeutic activities;Therapeutic exercise;Patient/family education;Manual techniques;Vasopneumatic Device    PT Next Visit Plan  slowly atart ROM, follow RC repair protocol       Patient will benefit from skilled therapeutic intervention in order to improve the following deficits and impairments:  Pain, Postural dysfunction, Increased muscle spasms, Decreased scar mobility, Decreased activity tolerance, Decreased range of motion, Decreased strength, Impaired UE functional use, Impaired flexibility, Increased edema  Visit Diagnosis: Acute pain of right shoulder  Stiffness of right shoulder, not elsewhere classified  Localized edema     Problem List Patient Active Problem List   Diagnosis Date Noted  . S/P rotator cuff repair 03/18/2014    Dylan Teruko Joswick, SPTA 07/01/2019, 10:24 AM  [address] [place], [place], [address] Phone: [phone]   Fax:  [phone]  Name: Stacey Kelly MRN: [id number] Date of Birth: [birth year]

## 2019-07-03 ENCOUNTER — Ambulatory Visit: Payer: PRIVATE HEALTH INSURANCE | Admitting: Physical Therapy

## 2019-07-03 ENCOUNTER — Other Ambulatory Visit: Payer: Self-pay

## 2019-07-03 ENCOUNTER — Encounter: Payer: Self-pay | Admitting: Physical Therapy

## 2019-07-03 DIAGNOSIS — M25611 Stiffness of right shoulder, not elsewhere classified: Secondary | ICD-10-CM

## 2019-07-03 DIAGNOSIS — R600 Localized edema: Secondary | ICD-10-CM

## 2019-07-03 DIAGNOSIS — M25511 Pain in right shoulder: Secondary | ICD-10-CM | POA: Diagnosis not present

## 2019-07-03 NOTE — Therapy (Signed)
Stacey Kelly, [place], [address] Phone: [phone]   Fax:  [phone]  Physical Therapy Treatment  Patient Details  Name: Stacey Kelly MRN: [id number] Date of Birth: [birth year] Referring Provider (PT): RA Theda Sers   Encounter Date: 07/03/2019  PT End of Session - 07/03/19 1020    Visit Number  3    Number of Visits  13    PT Start Time  0935    PT Stop Time  [id number]    PT Time Calculation (min)  54 min    Activity Tolerance  Patient tolerated treatment well;Treatment limited secondary to medical complications (Comment)    Behavior During Therapy  [facility] for tasks assessed/performed       Past Medical History:  Diagnosis Date  . Depression   . Heart murmur    ASYMPTOMATIC--  ECHO  1995  MILD REGURG  . Migraine   . OA (osteoarthritis)    shoulders  . PONV (postoperative nausea and vomiting)   . Rotator cuff tear, right   . Vitamin D deficiency   . Wears glasses     Past Surgical History:  Procedure Laterality Date  . BUNIONECTOMY Right 2004   BOTH SIDES OF FOOT  . CRYOTHERAPY    . HARDWARE REMOVAL Right 04/15/2014   Procedure: EXCISION OF RIGHT FOOT DORSAL HARDWARE;  Surgeon: Wylene Simmer, MD;  Location: [place];  Service: Orthopedics;  Laterality: Right;  . RIGHT SHOULDER SURGERY  X3  LAST ONE 2010   INCLUDING ROTATOR CUFF REPAIR /  RECONSTRUCTION  . SHOULDER ARTHROSCOPY WITH ROTATOR CUFF REPAIR Left 03/18/2014   Procedure: LEFT SHOULDER ARTHROSCOPY WITH EVALUATION UNDER ANESTHESIA DEBRIDEMENT SUBACROMIAL DECOMPRESSION DISTAL CLAVICAL RESECTION ROTATOR CUFF REPAIR BICEPS  TENDINOSIS;  Surgeon: Sydnee Cabal, MD;  Location: [place];  Service: Orthopedics;  Laterality: Left;  . SHOULDER ARTHROSCOPY WITH ROTATOR CUFF REPAIR Right 06/23/2019   Procedure: SHOULDER ARTHROSCOPY WITH ROTATOR CUFF REPAIR , biceps tenotomy;  Surgeon: Sydnee Cabal, MD;  Location:  [facility];  Service: Orthopedics;  Laterality: Right;  . TONSILLECTOMY  AGE 63    There were no vitals filed for this visit.  Subjective Assessment - 07/03/19 0937    Subjective  been doing ROM exercises and muscle exercises for shoulder elevation, protraction, retraction. yesterday was a great day. shoulders giving difficulties during night, very tender in the front. continued walking, only took two advil and muscle relaxant yesterday.    Pain Score  2                        [facility] Adult PT Treatment/Exercise - 07/03/19 0001      Shoulder Exercises: Seated   Elevation  AROM;Both;10 reps    Retraction  AROM;Both;10 reps    Protraction  AROM;Both;10 reps    Other Seated Exercises  AROM 2x10 R biceps      Shoulder Exercises: ROM/Strengthening   Other ROM/Strengthening Exercises  PROM of R shoulder on physio ball guided by Left      Modalities   Modalities  Vasopneumatic      Vasopneumatic   Number Minutes Vasopneumatic   10 minutes    Vasopnuematic Location   Shoulder   RT   Vasopneumatic Pressure  Medium    Vasopneumatic Temperature   34      Manual Therapy   Manual Therapy  Passive ROM    Manual therapy comments  all motions of the R shoulder with stretch at end range               PT Short Term Goals - 07/03/19 1024      PT SHORT TERM GOAL #1   Title  independent with initial HEP    Status  Achieved        PT Long Term Goals - 07/03/19 1024      PT LONG TERM GOAL #1   Title  decrease pain 50%    Baseline  40%    Status  On-going      PT LONG TERM GOAL #2   Title  AROM of the right shoulder to WFL's    Status  On-going      PT LONG TERM GOAL #3   Title  report no difficulty with dressing and doing hair    Status  On-going      PT LONG TERM GOAL #4   Title  sleep without pain down the arm    Status  On-going      PT LONG TERM GOAL #5   Title  lift 5# overhead with right arm    Status  On-going             Plan - 07/03/19 1022    Clinical Impression Statement  pt. tolerated treatment very well, guarding was present but able to activley relax and work through any discomoft present. Pt. will continue to benefit from PT to increase ROM, and proper activation of the muscles surrounding the [facility]    PT Treatment/Interventions  ADLs/Self Care Home Management;Cryotherapy;Electrical Stimulation;Therapeutic activities;Therapeutic exercise;Patient/family education;Manual techniques;Vasopneumatic Device    PT Next Visit Plan  slowly start ROM, follow RC repair protocol       Patient will benefit from skilled therapeutic intervention in order to improve the following deficits and impairments:  Pain, Postural dysfunction, Increased muscle spasms, Decreased scar mobility, Decreased activity tolerance, Decreased range of motion, Decreased strength, Impaired UE functional use, Impaired flexibility, Increased edema  Visit Diagnosis: Acute pain of right shoulder  Stiffness of right shoulder, not elsewhere classified  Localized edema     Problem List Patient Active Problem List   Diagnosis Date Noted  . S/P rotator cuff repair 03/18/2014    Stacey Kelly, SPTA 07/03/2019, 10:26 AM  [address] [place], [place], [address] Phone: [phone]   Fax:  [phone]  Name: Stacey Kelly MRN: [id number] Date of Birth: [birth year]

## 2019-07-07 ENCOUNTER — Encounter: Payer: Self-pay | Admitting: Physical Therapy

## 2019-07-07 ENCOUNTER — Other Ambulatory Visit: Payer: Self-pay

## 2019-07-07 ENCOUNTER — Ambulatory Visit: Payer: PRIVATE HEALTH INSURANCE | Attending: Specialist | Admitting: Physical Therapy

## 2019-07-07 DIAGNOSIS — M25511 Pain in right shoulder: Secondary | ICD-10-CM | POA: Diagnosis present

## 2019-07-07 DIAGNOSIS — M25611 Stiffness of right shoulder, not elsewhere classified: Secondary | ICD-10-CM | POA: Insufficient documentation

## 2019-07-07 DIAGNOSIS — R600 Localized edema: Secondary | ICD-10-CM | POA: Diagnosis present

## 2019-07-07 NOTE — Therapy (Signed)
[place], [place], [address] Phone: [phone]   Fax:  [phone]  Physical Therapy Treatment  Patient Details  Name: Stacey Kelly MRN: [id number] Date of Birth: 1956/05/07 Referring Provider (PT): RA Theda Sers   Encounter Date: 07/07/2019  PT End of Session - 07/07/19 1341    Visit Number  4    Number of Visits  13    Authorization Type  W/C    PT Start Time  1300    PT Stop Time  1356    PT Time Calculation (min)  56 min    Activity Tolerance  Patient tolerated treatment well    Behavior During Therapy  [facility] for tasks assessed/performed       Past Medical History:  Diagnosis Date  . Depression   . Heart murmur    ASYMPTOMATIC--  ECHO  1995  MILD REGURG  . Migraine   . OA (osteoarthritis)    shoulders  . PONV (postoperative nausea and vomiting)   . Rotator cuff tear, right   . Vitamin D deficiency   . Wears glasses     Past Surgical History:  Procedure Laterality Date  . BUNIONECTOMY Right 2004   BOTH SIDES OF FOOT  . CRYOTHERAPY    . HARDWARE REMOVAL Right 04/15/2014   Procedure: EXCISION OF RIGHT FOOT DORSAL HARDWARE;  Surgeon: Wylene Simmer, MD;  Location: [place];  Service: Orthopedics;  Laterality: Right;  . RIGHT SHOULDER SURGERY  X3  LAST ONE 2010   INCLUDING ROTATOR CUFF REPAIR /  RECONSTRUCTION  . SHOULDER ARTHROSCOPY WITH ROTATOR CUFF REPAIR Left 03/18/2014   Procedure: LEFT SHOULDER ARTHROSCOPY WITH EVALUATION UNDER ANESTHESIA DEBRIDEMENT SUBACROMIAL DECOMPRESSION DISTAL CLAVICAL RESECTION ROTATOR CUFF REPAIR BICEPS  TENDINOSIS;  Surgeon: Sydnee Cabal, MD;  Location: [place];  Service: Orthopedics;  Laterality: Left;  . SHOULDER ARTHROSCOPY WITH ROTATOR CUFF REPAIR Right 06/23/2019   Procedure: SHOULDER ARTHROSCOPY WITH ROTATOR CUFF REPAIR , biceps tenotomy;  Surgeon: Sydnee Cabal, MD;  Location: [facility];  Service:  Orthopedics;  Laterality: Right;  . TONSILLECTOMY  AGE 63    There were no vitals filed for this visit.  Subjective Assessment - 07/07/19 1304    Subjective  "Well its tight under my right breast and arm pit"    Currently in Pain?  Yes    Pain Score  2     Pain Location  Shoulder    Pain Orientation  Right                       [facility] Adult PT Treatment/Exercise - 07/07/19 0001      Shoulder Exercises: Standing   Other Standing Exercises  Rev shoulder rolls 2x15       Shoulder Exercises: Pulleys   Flexion  2 minutes    ABduction  2 minutes      Shoulder Exercises: ROM/Strengthening   Other ROM/Strengthening Exercises  PROM of R shoulder on physio ball guided by Left CW/CCW, flex       Modalities   Modalities  Vasopneumatic      Vasopneumatic   Number Minutes Vasopneumatic   10 minutes    Vasopnuematic Location   Shoulder    Vasopneumatic Pressure  Medium    Vasopneumatic Temperature   34      Manual Therapy   Manual Therapy  Passive ROM    Manual therapy comments  all  motions of the R shoulder with stretch at end range               PT Short Term Goals - 07/03/19 1024      PT SHORT TERM GOAL #1   Title  independent with initial HEP    Status  Achieved        PT Long Term Goals - 07/03/19 1024      PT LONG TERM GOAL #1   Title  decrease pain 50%    Baseline  40%    Status  On-going      PT LONG TERM GOAL #2   Title  AROM of the right shoulder to WFL's    Status  On-going      PT LONG TERM GOAL #3   Title  report no difficulty with dressing and doing hair    Status  On-going      PT LONG TERM GOAL #4   Title  sleep without pain down the arm    Status  On-going      PT LONG TERM GOAL #5   Title  lift 5# overhead with right arm    Status  On-going            Plan - 07/07/19 1342    Clinical Impression Statement  Pt did very well with today's interventions. Some guarding with abduction, internal, and external rotation  with MT. Tactile cues needed with scapular retractions for proper form. No reports of increase pain.    Stability/Clinical Decision Making  Evolving/Moderate complexity    Rehab Potential  Good    PT Frequency  3x / week    PT Duration  8 weeks    PT Treatment/Interventions  ADLs/Self Care Home Management;Cryotherapy;Electrical Stimulation;Therapeutic activities;Therapeutic exercise;Patient/family education;Manual techniques;Vasopneumatic Device    PT Next Visit Plan  slowly start ROM, follow RC repair protocol       Patient will benefit from skilled therapeutic intervention in order to improve the following deficits and impairments:  Pain, Postural dysfunction, Increased muscle spasms, Decreased scar mobility, Decreased activity tolerance, Decreased range of motion, Decreased strength, Impaired UE functional use, Impaired flexibility, Increased edema  Visit Diagnosis: Stiffness of right shoulder, not elsewhere classified  Acute pain of right shoulder  Localized edema     Problem List Patient Active Problem List   Diagnosis Date Noted  . S/P rotator cuff repair 03/18/2014    Scot Jun, PTA 07/07/2019, 1:48 PM  [address] [place], [place], [address] Phone: [phone]   Fax:  [phone]  Name: Stacey Kelly MRN: [id number] Date of Birth: [birth year]

## 2019-07-10 ENCOUNTER — Encounter: Payer: Self-pay | Admitting: Physical Therapy

## 2019-07-10 ENCOUNTER — Ambulatory Visit: Payer: PRIVATE HEALTH INSURANCE | Attending: Specialist | Admitting: Physical Therapy

## 2019-07-10 ENCOUNTER — Other Ambulatory Visit: Payer: Self-pay

## 2019-07-10 DIAGNOSIS — M25511 Pain in right shoulder: Secondary | ICD-10-CM | POA: Insufficient documentation

## 2019-07-10 DIAGNOSIS — M25611 Stiffness of right shoulder, not elsewhere classified: Secondary | ICD-10-CM | POA: Diagnosis not present

## 2019-07-10 NOTE — Therapy (Signed)
[place], [place], [address] Phone: [phone]   Fax:  [phone]  Physical Therapy Treatment  Patient Details  Name: Stacey Kelly MRN: [id number] Date of Birth: [birth year] Referring Provider (PT): RA Theda Sers   Encounter Date: 07/10/2019  PT End of Session - 07/10/19 0931    Visit Number  5    Number of Visits  13    PT Start Time  0851    PT Stop Time  0930    PT Time Calculation (min)  39 min    Activity Tolerance  Patient tolerated treatment well    Behavior During Therapy  [facility] for tasks assessed/performed       Past Medical History:  Diagnosis Date  . Depression   . Heart murmur    ASYMPTOMATIC--  ECHO  1995  MILD REGURG  . Migraine   . OA (osteoarthritis)    shoulders  . PONV (postoperative nausea and vomiting)   . Rotator cuff tear, right   . Vitamin D deficiency   . Wears glasses     Past Surgical History:  Procedure Laterality Date  . BUNIONECTOMY Right 2004   BOTH SIDES OF FOOT  . CRYOTHERAPY    . HARDWARE REMOVAL Right 04/15/2014   Procedure: EXCISION OF RIGHT FOOT DORSAL HARDWARE;  Surgeon: Wylene Simmer, MD;  Location: [place];  Service: Orthopedics;  Laterality: Right;  . RIGHT SHOULDER SURGERY  X3  LAST ONE 2010   INCLUDING ROTATOR CUFF REPAIR /  RECONSTRUCTION  . SHOULDER ARTHROSCOPY WITH ROTATOR CUFF REPAIR Left 03/18/2014   Procedure: LEFT SHOULDER ARTHROSCOPY WITH EVALUATION UNDER ANESTHESIA DEBRIDEMENT SUBACROMIAL DECOMPRESSION DISTAL CLAVICAL RESECTION ROTATOR CUFF REPAIR BICEPS  TENDINOSIS;  Surgeon: Sydnee Cabal, MD;  Location: [place];  Service: Orthopedics;  Laterality: Left;  . SHOULDER ARTHROSCOPY WITH ROTATOR CUFF REPAIR Right 06/23/2019   Procedure: SHOULDER ARTHROSCOPY WITH ROTATOR CUFF REPAIR , biceps tenotomy;  Surgeon: Sydnee Cabal, MD;  Location: [facility];  Service: Orthopedics;  Laterality:  Right;  . TONSILLECTOMY  AGE 63    There were no vitals filed for this visit.  Subjective Assessment - 07/10/19 0854    Subjective  seeing a doctor, pain over the top of the shoulder "kinda pinchy"    Pain Score  2     Pain Location  Shoulder    Pain Orientation  Right         [facility] PT Assessment - 07/10/19 0001      PROM   Right Shoulder Flexion  180 Degrees    Right Shoulder ABduction  180 Degrees    Right Shoulder Internal Rotation  44 Degrees    Right Shoulder External Rotation  58 Degrees                   [facility] Adult PT Treatment/Exercise - 07/10/19 0001      Shoulder Exercises: Standing   Other Standing Exercises  Rev shoulder rolls 2x15     Other Standing Exercises  bicep flexion, shldr flexion ROM 2x10      Shoulder Exercises: Pulleys   Flexion  2 minutes    ABduction  2 minutes    Other Pulley Exercises  ER 1 minutes      Shoulder Exercises: ROM/Strengthening   Other ROM/Strengthening Exercises  PROM of R shoulder on physio ball guided by Left CW/CCW, flex  PT Short Term Goals - 07/03/19 1024      PT SHORT TERM GOAL #1   Title  independent with initial HEP    Status  Achieved        PT Long Term Goals - 07/03/19 1024      PT LONG TERM GOAL #1   Title  decrease pain 50%    Baseline  40%    Status  On-going      PT LONG TERM GOAL #2   Title  AROM of the right shoulder to WFL's    Status  On-going      PT LONG TERM GOAL #3   Title  report no difficulty with dressing and doing hair    Status  On-going      PT LONG TERM GOAL #4   Title  sleep without pain down the arm    Status  On-going      PT LONG TERM GOAL #5   Title  lift 5# overhead with right arm    Status  On-going            Plan - 07/10/19 0931    Clinical Impression Statement  pt. 6 minutes late pt. tolerated interventions well, demonstarted a self ROM for shoulder flexion controlled by L arm to promote self care of condition. no reports  of pain throughout session and declined modalities.    PT Treatment/Interventions  ADLs/Self Care Home Management;Cryotherapy;Electrical Stimulation;Therapeutic activities;Therapeutic exercise;Patient/family education;Manual techniques;Vasopneumatic Device    PT Next Visit Plan  slowly start ROM, follow RC repair protocol       Patient will benefit from skilled therapeutic intervention in order to improve the following deficits and impairments:  Pain, Postural dysfunction, Increased muscle spasms, Decreased scar mobility, Decreased activity tolerance, Decreased range of motion, Decreased strength, Impaired UE functional use, Impaired flexibility, Increased edema  Visit Diagnosis: Stiffness of right shoulder, not elsewhere classified  Acute pain of right shoulder     Problem List Patient Active Problem List   Diagnosis Date Noted  . S/P rotator cuff repair 03/18/2014    Dylan Jenetta Wease, SPTA 07/10/2019, 9:34 AM  [address] [place], [place], [address] Phone: [phone]   Fax:  [phone]  Name: Stacey Kelly MRN: [id number] Date of Birth: [birth year]

## 2019-07-14 ENCOUNTER — Ambulatory Visit: Payer: PRIVATE HEALTH INSURANCE | Attending: Specialist | Admitting: Physical Therapy

## 2019-07-14 ENCOUNTER — Other Ambulatory Visit: Payer: Self-pay

## 2019-07-14 ENCOUNTER — Encounter: Payer: Self-pay | Admitting: Physical Therapy

## 2019-07-14 DIAGNOSIS — M25611 Stiffness of right shoulder, not elsewhere classified: Secondary | ICD-10-CM | POA: Insufficient documentation

## 2019-07-14 DIAGNOSIS — R600 Localized edema: Secondary | ICD-10-CM | POA: Diagnosis not present

## 2019-07-14 DIAGNOSIS — M25511 Pain in right shoulder: Secondary | ICD-10-CM | POA: Insufficient documentation

## 2019-07-14 NOTE — Therapy (Signed)
[place], [place], [address] Phone: [phone]   Fax:  [phone]  Physical Therapy Treatment  Patient Details  Name: Stacey Kelly MRN: [id number] Date of Birth: 1956-05-04 Referring Provider (PT): RA Theda Sers   Encounter Date: 07/14/2019  PT End of Session - 07/14/19 0925    Number of Visits  13    Authorization Type  W/C    PT Start Time  0850    PT Stop Time  0936    PT Time Calculation (min)  46 min    Activity Tolerance  Patient tolerated treatment well    Behavior During Therapy  [facility] for tasks assessed/performed       Past Medical History:  Diagnosis Date  . Depression   . Heart murmur    ASYMPTOMATIC--  ECHO  1995  MILD REGURG  . Migraine   . OA (osteoarthritis)    shoulders  . PONV (postoperative nausea and vomiting)   . Rotator cuff tear, right   . Vitamin D deficiency   . Wears glasses     Past Surgical History:  Procedure Laterality Date  . BUNIONECTOMY Right 2004   BOTH SIDES OF FOOT  . CRYOTHERAPY    . HARDWARE REMOVAL Right 04/15/2014   Procedure: EXCISION OF RIGHT FOOT DORSAL HARDWARE;  Surgeon: Wylene Simmer, MD;  Location: [place];  Service: Orthopedics;  Laterality: Right;  . RIGHT SHOULDER SURGERY  X3  LAST ONE 2010   INCLUDING ROTATOR CUFF REPAIR /  RECONSTRUCTION  . SHOULDER ARTHROSCOPY WITH ROTATOR CUFF REPAIR Left 03/18/2014   Procedure: LEFT SHOULDER ARTHROSCOPY WITH EVALUATION UNDER ANESTHESIA DEBRIDEMENT SUBACROMIAL DECOMPRESSION DISTAL CLAVICAL RESECTION ROTATOR CUFF REPAIR BICEPS  TENDINOSIS;  Surgeon: Sydnee Cabal, MD;  Location: [place];  Service: Orthopedics;  Laterality: Left;  . SHOULDER ARTHROSCOPY WITH ROTATOR CUFF REPAIR Right 06/23/2019   Procedure: SHOULDER ARTHROSCOPY WITH ROTATOR CUFF REPAIR , biceps tenotomy;  Surgeon: Sydnee Cabal, MD;  Location: [facility];  Service: Orthopedics;   Laterality: Right;  . TONSILLECTOMY  AGE 76    There were no vitals filed for this visit.  Subjective Assessment - 07/14/19 0852    Subjective  "Doing great"    Currently in Pain?  Yes    Pain Score  2     Pain Location  Shoulder    Pain Orientation  Right;Anterior;Upper                       [facility] Adult PT Treatment/Exercise - 07/14/19 0001      Shoulder Exercises: Standing   Other Standing Exercises  Rev shoulder rolls 2x15     Other Standing Exercises  bicep curls 2lb cuff on cane 2x10      Shoulder Exercises: Pulleys   Flexion  2 minutes    ABduction  2 minutes      Shoulder Exercises: Isometric Strengthening   External Rotation  3X5"   x2   Internal Rotation  3X5"   x2     Modalities   Modalities  Vasopneumatic      Vasopneumatic   Number Minutes Vasopneumatic   15 minutes    Vasopnuematic Location   Shoulder    Vasopneumatic Pressure  Medium    Vasopneumatic Temperature   34      Manual Therapy   Manual Therapy  Passive ROM    Manual therapy comments  all motions of  the R shoulder with stretch at end range               PT Short Term Goals - 07/03/19 1024      PT SHORT TERM GOAL #1   Title  independent with initial HEP    Status  Achieved        PT Long Term Goals - 07/03/19 1024      PT LONG TERM GOAL #1   Title  decrease pain 50%    Baseline  40%    Status  On-going      PT LONG TERM GOAL #2   Title  AROM of the right shoulder to WFL's    Status  On-going      PT LONG TERM GOAL #3   Title  report no difficulty with dressing and doing hair    Status  On-going      PT LONG TERM GOAL #4   Title  sleep without pain down the arm    Status  On-going      PT LONG TERM GOAL #5   Title  lift 5# overhead with right arm    Status  On-going            Plan - 07/14/19 0925    Clinical Impression Statement  Pt ! 5 minutes late for today's therapy session. She continues to do well overall. Some limitation noted with  passive R shoulder external rotation. Progressed to some 2 finger isometric exercises to activate rotator cuff.    Stability/Clinical Decision Making  Evolving/Moderate complexity    PT Frequency  3x / week    PT Treatment/Interventions  ADLs/Self Care Home Management;Cryotherapy;Electrical Stimulation;Therapeutic activities;Therapeutic exercise;Patient/family education;Manual techniques;Vasopneumatic Device    PT Next Visit Plan  slowly start ROM, follow RC repair protocol       Patient will benefit from skilled therapeutic intervention in order to improve the following deficits and impairments:  Pain, Postural dysfunction, Increased muscle spasms, Decreased scar mobility, Decreased activity tolerance, Decreased range of motion, Decreased strength, Impaired UE functional use, Impaired flexibility, Increased edema  Visit Diagnosis: Localized edema  Acute pain of right shoulder  Stiffness of right shoulder, not elsewhere classified     Problem List Patient Active Problem List   Diagnosis Date Noted  . S/P rotator cuff repair 03/18/2014    Scot Jun, PTA 07/14/2019, 9:28 AM  [place] [address] [place], [place], [address] Phone: [phone]   Fax:  [phone]  Name: Stacey Kelly MRN: [id number] Date of Birth: 1956-10-02

## 2019-07-17 ENCOUNTER — Ambulatory Visit: Payer: PRIVATE HEALTH INSURANCE | Admitting: Physical Therapy

## 2019-07-17 ENCOUNTER — Encounter: Payer: Self-pay | Admitting: Physical Therapy

## 2019-07-17 ENCOUNTER — Other Ambulatory Visit: Payer: Self-pay

## 2019-07-17 DIAGNOSIS — R600 Localized edema: Secondary | ICD-10-CM | POA: Diagnosis not present

## 2019-07-17 DIAGNOSIS — M25511 Pain in right shoulder: Secondary | ICD-10-CM

## 2019-07-17 DIAGNOSIS — M25611 Stiffness of right shoulder, not elsewhere classified: Secondary | ICD-10-CM

## 2019-07-17 NOTE — Therapy (Signed)
[place], [place], [address] Phone: [phone]   Fax:  [phone]  Physical Therapy Treatment  Patient Details  Name: Stacey Kelly MRN: [id number] Date of Birth: 15-Jul-1956 Referring Provider (PT): RA Theda Sers   Encounter Date: 07/17/2019  PT End of Session - 07/17/19 0919    Visit Number  6    Number of Visits  13    Authorization Type  W/C    PT Start Time  0846    PT Stop Time  0934    PT Time Calculation (min)  48 min    Activity Tolerance  Patient tolerated treatment well    Behavior During Therapy  [facility] for tasks assessed/performed       Past Medical History:  Diagnosis Date  . Depression   . Heart murmur    ASYMPTOMATIC--  ECHO  1995  MILD REGURG  . Migraine   . OA (osteoarthritis)    shoulders  . PONV (postoperative nausea and vomiting)   . Rotator cuff tear, right   . Vitamin D deficiency   . Wears glasses     Past Surgical History:  Procedure Laterality Date  . BUNIONECTOMY Right 2004   BOTH SIDES OF FOOT  . CRYOTHERAPY    . HARDWARE REMOVAL Right 04/15/2014   Procedure: EXCISION OF RIGHT FOOT DORSAL HARDWARE;  Surgeon: Wylene Simmer, MD;  Location: [place];  Service: Orthopedics;  Laterality: Right;  . RIGHT SHOULDER SURGERY  X3  LAST ONE 2010   INCLUDING ROTATOR CUFF REPAIR /  RECONSTRUCTION  . SHOULDER ARTHROSCOPY WITH ROTATOR CUFF REPAIR Left 03/18/2014   Procedure: LEFT SHOULDER ARTHROSCOPY WITH EVALUATION UNDER ANESTHESIA DEBRIDEMENT SUBACROMIAL DECOMPRESSION DISTAL CLAVICAL RESECTION ROTATOR CUFF REPAIR BICEPS  TENDINOSIS;  Surgeon: Sydnee Cabal, MD;  Location: [place];  Service: Orthopedics;  Laterality: Left;  . SHOULDER ARTHROSCOPY WITH ROTATOR CUFF REPAIR Right 06/23/2019   Procedure: SHOULDER ARTHROSCOPY WITH ROTATOR CUFF REPAIR , biceps tenotomy;  Surgeon: Sydnee Cabal, MD;  Location: [facility];  Service:  Orthopedics;  Laterality: Right;  . TONSILLECTOMY  AGE 69    There were no vitals filed for this visit.  Subjective Assessment - 07/17/19 0852    Subjective  "Great" "Yesterday my arm felt twice as big"    Limitations  Lifting;House hold activities    Patient Stated Goals  get back to work    Currently in Pain?  Yes    Pain Score  1     Pain Location  Shoulder    Pain Orientation  Right                       [facility] Adult PT Treatment/Exercise - 07/17/19 0001      Shoulder Exercises: Pulleys   Flexion  2 minutes    ABduction  2 minutes      Shoulder Exercises: ROM/Strengthening   Other ROM/Strengthening Exercises  PROM of R shoulder on physio ball guided by Left CW/CCW, flex       Modalities   Modalities  Vasopneumatic      Vasopneumatic   Number Minutes Vasopneumatic   15 minutes    Vasopnuematic Location   Shoulder    Vasopneumatic Pressure  Medium    Vasopneumatic Temperature   34      Manual Therapy   Manual Therapy  Passive ROM    Manual therapy comments  all motions of  the R shoulder with stretch at end range               PT Short Term Goals - 07/03/19 1024      PT SHORT TERM GOAL #1   Title  independent with initial HEP    Status  Achieved        PT Long Term Goals - 07/17/19 5956      PT LONG TERM GOAL #1   Title  decrease pain 50%    Status  Partially Met      PT LONG TERM GOAL #2   Title  AROM of the right shoulder to WFL's    Status  Partially Met      PT LONG TERM GOAL #3   Title  report no difficulty with dressing and doing hair    Status  On-going            Plan - 07/17/19 0921    Clinical Impression Statement  Pt tolerated today's treatment well. She reports some initial tightness with the pulley exercises but that did loosen up. No issues with the 2 finger isometrics. Some initial tightness with ER/IR of R UE during MT but that improved as MT progressed.    Stability/Clinical Decision Making   Evolving/Moderate complexity    Rehab Potential  Good    PT Frequency  3x / week    PT Duration  8 weeks    PT Treatment/Interventions  ADLs/Self Care Home Management;Cryotherapy;Electrical Stimulation;Therapeutic activities;Therapeutic exercise;Patient/family education;Manual techniques;Vasopneumatic Device    PT Next Visit Plan  slowly start ROM, follow RC repair protocol       Patient will benefit from skilled therapeutic intervention in order to improve the following deficits and impairments:  Pain, Postural dysfunction, Increased muscle spasms, Decreased scar mobility, Decreased activity tolerance, Decreased range of motion, Decreased strength, Impaired UE functional use, Impaired flexibility, Increased edema  Visit Diagnosis: Acute pain of right shoulder  Stiffness of right shoulder, not elsewhere classified  Localized edema     Problem List Patient Active Problem List   Diagnosis Date Noted  . S/P rotator cuff repair 03/18/2014    Scot Jun, PTA 07/17/2019, 9:25 AM  [place] [address] [place], [place], [address] Phone: [phone]   Fax:  [phone]  Name: Stacey Kelly MRN: [id number] Date of Birth: Nov 02, 1955

## 2019-07-20 ENCOUNTER — Other Ambulatory Visit: Payer: Self-pay

## 2019-07-20 ENCOUNTER — Encounter: Payer: Self-pay | Admitting: Physical Therapy

## 2019-07-20 ENCOUNTER — Ambulatory Visit: Payer: PRIVATE HEALTH INSURANCE | Attending: Specialist | Admitting: Physical Therapy

## 2019-07-20 DIAGNOSIS — R600 Localized edema: Secondary | ICD-10-CM | POA: Diagnosis present

## 2019-07-20 DIAGNOSIS — M25611 Stiffness of right shoulder, not elsewhere classified: Secondary | ICD-10-CM | POA: Diagnosis present

## 2019-07-20 DIAGNOSIS — M25511 Pain in right shoulder: Secondary | ICD-10-CM

## 2019-07-20 NOTE — Therapy (Signed)
[address] [place], [place], [address] Phone: [phone]   Fax:  [phone]  Physical Therapy Treatment  Patient Details  Name: Stacey Kelly MRN: [id number] Date of Birth: [birth year] Referring Provider (PT): RA Theda Sers   Encounter Date: 07/20/2019  PT End of Session - 07/20/19 0845    Visit Number  7    Number of Visits  13    Authorization Type  W/C    PT Start Time  0810    PT Stop Time  0901    PT Time Calculation (min)  51 min    Activity Tolerance  Patient tolerated treatment well    Behavior During Therapy  [facility] for tasks assessed/performed       Past Medical History:  Diagnosis Date  . Depression   . Heart murmur    ASYMPTOMATIC--  ECHO  1995  MILD REGURG  . Migraine   . OA (osteoarthritis)    shoulders  . PONV (postoperative nausea and vomiting)   . Rotator cuff tear, right   . Vitamin D deficiency   . Wears glasses     Past Surgical History:  Procedure Laterality Date  . BUNIONECTOMY Right 2004   BOTH SIDES OF FOOT  . CRYOTHERAPY    . HARDWARE REMOVAL Right 04/15/2014   Procedure: EXCISION OF RIGHT FOOT DORSAL HARDWARE;  Surgeon: Wylene Simmer, MD;  Location: [place];  Service: Orthopedics;  Laterality: Right;  . RIGHT SHOULDER SURGERY  X3  LAST ONE 2010   INCLUDING ROTATOR CUFF REPAIR /  RECONSTRUCTION  . SHOULDER ARTHROSCOPY WITH ROTATOR CUFF REPAIR Left 03/18/2014   Procedure: LEFT SHOULDER ARTHROSCOPY WITH EVALUATION UNDER ANESTHESIA DEBRIDEMENT SUBACROMIAL DECOMPRESSION DISTAL CLAVICAL RESECTION ROTATOR CUFF REPAIR BICEPS  TENDINOSIS;  Surgeon: Sydnee Cabal, MD;  Location: [place];  Service: Orthopedics;  Laterality: Left;  . SHOULDER ARTHROSCOPY WITH ROTATOR CUFF REPAIR Right 06/23/2019   Procedure: SHOULDER ARTHROSCOPY WITH ROTATOR CUFF REPAIR , biceps tenotomy;  Surgeon: Sydnee Cabal, MD;  Location: [facility];  Service:  Orthopedics;  Laterality: Right;  . TONSILLECTOMY  AGE 26    There were no vitals filed for this visit.  Subjective Assessment - 07/20/19 0810    Subjective  "Good"    Patient Stated Goals  get back to work    Currently in Pain?  No/denies                       [facility] Adult PT Treatment/Exercise - 07/20/19 0001      Shoulder Exercises: Supine   External Rotation  AAROM;Right;20 reps   cane   Flexion  AAROM;Both;20 reps   cane     Shoulder Exercises: Standing   Extension  AAROM;Both;20 reps    Other Standing Exercises  Pulley Tricept ext 5lb 2x10    Other Standing Exercises  bicep curls 2lb cuff on cane 2x10, ABC's Pball on table AAROM x2       Shoulder Exercises: Pulleys   Flexion  2 minutes    ABduction  2 minutes      Shoulder Exercises: ROM/Strengthening   UBE (Upper Arm Bike)  L1 x3 min       Modalities   Modalities  Vasopneumatic      Vasopneumatic   Number Minutes Vasopneumatic   15 minutes    Vasopnuematic Location   Shoulder    Vasopneumatic Pressure  Medium    Vasopneumatic  Temperature   34      Manual Therapy   Manual Therapy  Passive ROM    Manual therapy comments  all motions of the R shoulder with stretch at end range               PT Short Term Goals - 07/03/19 1024      PT SHORT TERM GOAL #1   Title  independent with initial HEP    Status  Achieved        PT Long Term Goals - 07/17/19 6237      PT LONG TERM GOAL #1   Title  decrease pain 50%    Status  Partially Met      PT LONG TERM GOAL #2   Title  AROM of the right shoulder to WFL's    Status  Partially Met      PT LONG TERM GOAL #3   Title  report no difficulty with dressing and doing hair    Status  On-going            Plan - 07/20/19 0846    Clinical Impression Statement  Pt ~ 10 minutes late due to having to wait to check in. Pt did well progressing towards AAROM interventions. Reports some scapula clicking when on UBE. No report  of pain. PROM  is really well with some limitation with external rotation.    Stability/Clinical Decision Making  Evolving/Moderate complexity    Rehab Potential  Good    PT Frequency  3x / week    PT Duration  8 weeks    PT Treatment/Interventions  ADLs/Self Care Home Management;Cryotherapy;Electrical Stimulation;Therapeutic activities;Therapeutic exercise;Patient/family education;Manual techniques;Vasopneumatic Device    PT Next Visit Plan  slowly start ROM, follow RC repair protocol       Patient will benefit from skilled therapeutic intervention in order to improve the following deficits and impairments:  Pain, Postural dysfunction, Increased muscle spasms, Decreased scar mobility, Decreased activity tolerance, Decreased range of motion, Decreased strength, Impaired UE functional use, Impaired flexibility, Increased edema  Visit Diagnosis: Acute pain of right shoulder  Stiffness of right shoulder, not elsewhere classified  Localized edema     Problem List Patient Active Problem List   Diagnosis Date Noted  . S/P rotator cuff repair 03/18/2014    Scot Jun, PTA 07/20/2019, 8:48 AM  [place] [address] [place], [place], [address] Phone: [phone]   Fax:  [phone]  Name: Stacey Kelly MRN: [id number] Date of Birth: 1955/12/07

## 2019-07-23 ENCOUNTER — Other Ambulatory Visit: Payer: Self-pay

## 2019-07-23 ENCOUNTER — Ambulatory Visit: Payer: PRIVATE HEALTH INSURANCE | Attending: Specialist | Admitting: Physical Therapy

## 2019-07-23 ENCOUNTER — Encounter: Payer: Self-pay | Admitting: Physical Therapy

## 2019-07-23 DIAGNOSIS — M25511 Pain in right shoulder: Secondary | ICD-10-CM

## 2019-07-23 DIAGNOSIS — M25611 Stiffness of right shoulder, not elsewhere classified: Secondary | ICD-10-CM | POA: Diagnosis present

## 2019-07-23 DIAGNOSIS — R600 Localized edema: Secondary | ICD-10-CM

## 2019-07-23 NOTE — Therapy (Signed)
[facility] [address] [place], [place], [address] Phone: [phone]   Fax:  [phone]  Physical Therapy Treatment  Patient Details  Name: Stacey Kelly MRN: [id number] Date of Birth: 1956/08/22 Referring Provider (PT): RA Theda Sers   Encounter Date: 07/23/2019  PT End of Session - 07/23/19 0845    Visit Number  8    Authorization Type  W/C    PT Start Time  0802    PT Stop Time  0859    PT Time Calculation (min)  57 min    Activity Tolerance  Patient tolerated treatment well    Behavior During Therapy  [facility] for tasks assessed/performed       Past Medical History:  Diagnosis Date  . Depression   . Heart murmur    ASYMPTOMATIC--  ECHO  1995  MILD REGURG  . Migraine   . OA (osteoarthritis)    shoulders  . PONV (postoperative nausea and vomiting)   . Rotator cuff tear, right   . Vitamin D deficiency   . Wears glasses     Past Surgical History:  Procedure Laterality Date  . BUNIONECTOMY Right 2004   BOTH SIDES OF FOOT  . CRYOTHERAPY    . HARDWARE REMOVAL Right 04/15/2014   Procedure: EXCISION OF RIGHT FOOT DORSAL HARDWARE;  Surgeon: Wylene Simmer, MD;  Location: [place];  Service: Orthopedics;  Laterality: Right;  . RIGHT SHOULDER SURGERY  X3  LAST ONE 2010   INCLUDING ROTATOR CUFF REPAIR /  RECONSTRUCTION  . SHOULDER ARTHROSCOPY WITH ROTATOR CUFF REPAIR Left 03/18/2014   Procedure: LEFT SHOULDER ARTHROSCOPY WITH EVALUATION UNDER ANESTHESIA DEBRIDEMENT SUBACROMIAL DECOMPRESSION DISTAL CLAVICAL RESECTION ROTATOR CUFF REPAIR BICEPS  TENDINOSIS;  Surgeon: Sydnee Cabal, MD;  Location: [place];  Service: Orthopedics;  Laterality: Left;  . SHOULDER ARTHROSCOPY WITH ROTATOR CUFF REPAIR Right 06/23/2019   Procedure: SHOULDER ARTHROSCOPY WITH ROTATOR CUFF REPAIR , biceps tenotomy;  Surgeon: Sydnee Cabal, MD;  Location: [facility];  Service: Orthopedics;  Laterality:  Right;  . TONSILLECTOMY  AGE 63    There were no vitals filed for this visit.  Subjective Assessment - 07/23/19 0808    Subjective  "Great just stiff"    Currently in Pain?  No/denies                       [facility] Adult PT Treatment/Exercise - 07/23/19 0001      Shoulder Exercises: Supine   Other Supine Exercises  serratus punches x15       Shoulder Exercises: Standing   Extension  AAROM;Both;20 reps    Other Standing Exercises  AAROM flex, Ext, IR up back 2x10, Pulley Tricept ext 10lb 2x10    Other Standing Exercises  bicep curls 5lb pulley  2x10, ABC's Pball on table AAROM x2       Shoulder Exercises: ROM/Strengthening   UBE (Upper Arm Bike)  L1 x3 min  each       Modalities   Modalities  Vasopneumatic      Vasopneumatic   Number Minutes Vasopneumatic   15 minutes    Vasopnuematic Location   Shoulder    Vasopneumatic Pressure  Medium    Vasopneumatic Temperature   34      Manual Therapy   Manual Therapy  Passive ROM    Manual therapy comments  all motions of the R shoulder with stretch at end range  PT Short Term Goals - 07/03/19 1024      PT SHORT TERM GOAL #1   Title  independent with initial HEP    Status  Achieved        PT Long Term Goals - 07/17/19 0921      PT LONG TERM GOAL #1   Title  decrease pain 50%    Status  Partially Met      PT LONG TERM GOAL #2   Title  AROM of the right shoulder to WFL's    Status  Partially Met      PT LONG TERM GOAL #3   Title  report no difficulty with dressing and doing hair    Status  On-going            Plan - 07/23/19 0845    Clinical Impression Statement  Progressed to some more AAROM interventions. Some R shoulder elevation noted with standing shoulder flexion with limites motion. All other exercises completed well, cues to keep arm by her side with triceps extensions. Pt has full AAROM shoulder flexion in supine. AROM remain well with some limitation with external  rotation.    Stability/Clinical Decision Making  Evolving/Moderate complexity    Rehab Potential  Good    PT Frequency  3x / week    PT Treatment/Interventions  ADLs/Self Care Home Management;Cryotherapy;Electrical Stimulation;Therapeutic activities;Therapeutic exercise;Patient/family education;Manual techniques;Vasopneumatic Device    PT Next Visit Plan  slowly start ROM, follow RC repair protocol       Patient will benefit from skilled therapeutic intervention in order to improve the following deficits and impairments:  Pain, Postural dysfunction, Increased muscle spasms, Decreased scar mobility, Decreased activity tolerance, Decreased range of motion, Decreased strength, Impaired UE functional use, Impaired flexibility, Increased edema  Visit Diagnosis: Acute pain of right shoulder  Localized edema  Stiffness of right shoulder, not elsewhere classified     Problem List Patient Active Problem List   Diagnosis Date Noted  . S/P rotator cuff repair 03/18/2014    Scot Jun, PTA 07/23/2019, 8:48 AM  [address] [place], [place], [address] Phone: [phone]   Fax:  [phone]  Name: Stacey Kelly MRN: [id number] Date of Birth: Jun 21, 1956

## 2019-07-27 ENCOUNTER — Encounter: Payer: Self-pay | Admitting: Physical Therapy

## 2019-07-27 ENCOUNTER — Other Ambulatory Visit: Payer: Self-pay

## 2019-07-27 ENCOUNTER — Ambulatory Visit: Payer: PRIVATE HEALTH INSURANCE | Attending: Specialist | Admitting: Physical Therapy

## 2019-07-27 DIAGNOSIS — R600 Localized edema: Secondary | ICD-10-CM | POA: Diagnosis present

## 2019-07-27 DIAGNOSIS — M25511 Pain in right shoulder: Secondary | ICD-10-CM | POA: Diagnosis not present

## 2019-07-27 DIAGNOSIS — M25611 Stiffness of right shoulder, not elsewhere classified: Secondary | ICD-10-CM | POA: Insufficient documentation

## 2019-07-27 NOTE — Therapy (Signed)
[facility] [place], [place], [address] Phone: [phone]   Fax:  [phone]  Physical Therapy Treatment  Patient Details  Name: Stacey Kelly MRN: [id number] Date of Birth: 1956/02/01 Referring Provider (PT): RA Theda Sers   Encounter Date: 07/27/2019  PT End of Session - 07/27/19 0847    Visit Number  9    Number of Visits  13    Authorization Type  W/C    PT Start Time  0803    PT Stop Time  0901    PT Time Calculation (min)  58 min    Activity Tolerance  Patient tolerated treatment well    Behavior During Therapy  [facility] for tasks assessed/performed       Past Medical History:  Diagnosis Date  . Depression   . Heart murmur    ASYMPTOMATIC--  ECHO  1995  MILD REGURG  . Migraine   . OA (osteoarthritis)    shoulders  . PONV (postoperative nausea and vomiting)   . Rotator cuff tear, right   . Vitamin D deficiency   . Wears glasses     Past Surgical History:  Procedure Laterality Date  . BUNIONECTOMY Right 2004   BOTH SIDES OF FOOT  . CRYOTHERAPY    . HARDWARE REMOVAL Right 04/15/2014   Procedure: EXCISION OF RIGHT FOOT DORSAL HARDWARE;  Surgeon: Wylene Simmer, MD;  Location: [place];  Service: Orthopedics;  Laterality: Right;  . RIGHT SHOULDER SURGERY  X3  LAST ONE 2010   INCLUDING ROTATOR CUFF REPAIR /  RECONSTRUCTION  . SHOULDER ARTHROSCOPY WITH ROTATOR CUFF REPAIR Left 03/18/2014   Procedure: LEFT SHOULDER ARTHROSCOPY WITH EVALUATION UNDER ANESTHESIA DEBRIDEMENT SUBACROMIAL DECOMPRESSION DISTAL CLAVICAL RESECTION ROTATOR CUFF REPAIR BICEPS  TENDINOSIS;  Surgeon: Sydnee Cabal, MD;  Location: [place];  Service: Orthopedics;  Laterality: Left;  . SHOULDER ARTHROSCOPY WITH ROTATOR CUFF REPAIR Right 06/23/2019   Procedure: SHOULDER ARTHROSCOPY WITH ROTATOR CUFF REPAIR , biceps tenotomy;  Surgeon: Sydnee Cabal, MD;  Location: [facility];  Service:  Orthopedics;  Laterality: Right;  . TONSILLECTOMY  AGE 62    There were no vitals filed for this visit.  Subjective Assessment - 07/27/19 0807    Subjective  Just stiff    Limitations  Lifting;House hold activities    Currently in Pain?  No/denies                       [facility] Adult PT Treatment/Exercise - 07/27/19 0001      Shoulder Exercises: Standing   Other Standing Exercises  AAROM flex, Ext, IR up back 2x10, AAROM bent over rows cane 2x10, Pulley Tricept ext 10lb 2x10    Other Standing Exercises  bicep curls 5lb pulley  2x10, ABC's Pball on table AAROM x2       Shoulder Exercises: Pulleys   Flexion  2 minutes    ABduction  2 minutes      Shoulder Exercises: ROM/Strengthening   UBE (Upper Arm Bike)  L1 x3 min  each       Vasopneumatic   Number Minutes Vasopneumatic   15 minutes    Vasopnuematic Location   Shoulder    Vasopneumatic Pressure  Medium    Vasopneumatic Temperature   34      Manual Therapy   Manual Therapy  Passive ROM    Manual therapy comments  all motions of the R shoulder with  stretch at end range               PT Short Term Goals - 07/03/19 1024      PT SHORT TERM GOAL #1   Title  independent with initial HEP    Status  Achieved        PT Long Term Goals - 07/17/19 3532      PT LONG TERM GOAL #1   Title  decrease pain 50%    Status  Partially Met      PT LONG TERM GOAL #2   Title  AROM of the right shoulder to WFL's    Status  Partially Met      PT LONG TERM GOAL #3   Title  report no difficulty with dressing and doing hair    Status  On-going            Plan - 07/27/19 0848    Clinical Impression Statement  Initial stiffness reported that improved as treatment progressed. R shoulder continues to elevate with AAROM shoulder flexion. Postural cues needed with bent over rows using cane. R shoulder AROM is good overall with some ER stiffness.    Stability/Clinical Decision Making  Evolving/Moderate complexity     Rehab Potential  Good    PT Frequency  3x / week    PT Duration  8 weeks    PT Treatment/Interventions  ADLs/Self Care Home Management;Cryotherapy;Electrical Stimulation;Therapeutic activities;Therapeutic exercise;Patient/family education;Manual techniques;Vasopneumatic Device    PT Next Visit Plan  slowly start ROM, follow RC repair protocol       Patient will benefit from skilled therapeutic intervention in order to improve the following deficits and impairments:  Pain, Postural dysfunction, Increased muscle spasms, Decreased scar mobility, Decreased activity tolerance, Decreased range of motion, Decreased strength, Impaired UE functional use, Impaired flexibility, Increased edema  Visit Diagnosis: Acute pain of right shoulder  Localized edema  Stiffness of right shoulder, not elsewhere classified     Problem List Patient Active Problem List   Diagnosis Date Noted  . S/P rotator cuff repair 03/18/2014    Scot Jun, PTA 07/27/2019, 8:53 AM  [address] [place], [place], [address] Phone: [phone]   Fax:  [phone]  Name: Stacey Kelly MRN: [id number] Date of Birth: 16-Jul-1956

## 2019-07-30 ENCOUNTER — Other Ambulatory Visit: Payer: Self-pay

## 2019-07-30 ENCOUNTER — Ambulatory Visit: Payer: PRIVATE HEALTH INSURANCE | Attending: Specialist | Admitting: Physical Therapy

## 2019-07-30 ENCOUNTER — Encounter: Payer: Self-pay | Admitting: Physical Therapy

## 2019-07-30 DIAGNOSIS — M25611 Stiffness of right shoulder, not elsewhere classified: Secondary | ICD-10-CM

## 2019-07-30 DIAGNOSIS — R600 Localized edema: Secondary | ICD-10-CM | POA: Diagnosis present

## 2019-07-30 DIAGNOSIS — M25511 Pain in right shoulder: Secondary | ICD-10-CM | POA: Diagnosis present

## 2019-07-30 NOTE — Therapy (Signed)
[place], [place], [address] Phone: [phone]   Fax:  [phone]  Physical Therapy Treatment  Patient Details  Name: Stacey Kelly MRN: [id number] Date of Birth: [birth year] Referring Provider (PT): RA Theda Sers   Encounter Date: 07/30/2019  PT End of Session - 07/30/19 0844    Visit Number  10    Number of Visits  13    Authorization Type  W/C    PT Start Time  0800    PT Stop Time  8882    PT Time Calculation (min)  57 min    Activity Tolerance  Patient tolerated treatment well    Behavior During Therapy  [facility] for tasks assessed/performed       Past Medical History:  Diagnosis Date  . Depression   . Heart murmur    ASYMPTOMATIC--  ECHO  1995  MILD REGURG  . Migraine   . OA (osteoarthritis)    shoulders  . PONV (postoperative nausea and vomiting)   . Rotator cuff tear, right   . Vitamin D deficiency   . Wears glasses     Past Surgical History:  Procedure Laterality Date  . BUNIONECTOMY Right 2004   BOTH SIDES OF FOOT  . CRYOTHERAPY    . HARDWARE REMOVAL Right 04/15/2014   Procedure: EXCISION OF RIGHT FOOT DORSAL HARDWARE;  Surgeon: Wylene Simmer, MD;  Location: [place];  Service: Orthopedics;  Laterality: Right;  . RIGHT SHOULDER SURGERY  X3  LAST ONE 2010   INCLUDING ROTATOR CUFF REPAIR /  RECONSTRUCTION  . SHOULDER ARTHROSCOPY WITH ROTATOR CUFF REPAIR Left 03/18/2014   Procedure: LEFT SHOULDER ARTHROSCOPY WITH EVALUATION UNDER ANESTHESIA DEBRIDEMENT SUBACROMIAL DECOMPRESSION DISTAL CLAVICAL RESECTION ROTATOR CUFF REPAIR BICEPS  TENDINOSIS;  Surgeon: Sydnee Cabal, MD;  Location: [place];  Service: Orthopedics;  Laterality: Left;  . SHOULDER ARTHROSCOPY WITH ROTATOR CUFF REPAIR Right 06/23/2019   Procedure: SHOULDER ARTHROSCOPY WITH ROTATOR CUFF REPAIR , biceps tenotomy;  Surgeon: Sydnee Cabal, MD;  Location: [facility];  Service:  Orthopedics;  Laterality: Right;  . TONSILLECTOMY  AGE 43    There were no vitals filed for this visit.  Subjective Assessment - 07/30/19 0804    Subjective  "I feel good"    Currently in Pain?  No/denies    Pain Location  Shoulder    Pain Orientation  Right                       [facility] Adult PT Treatment/Exercise - 07/30/19 0001      Shoulder Exercises: Standing   Flexion  AAROM;Right;10 reps   ladder    Other Standing Exercises  AAROM flex, Ext, IR up back 2x10, AAROM bent over rows cane 2x10, Pulley Tricept ext 10lb 2x10    Other Standing Exercises  hammer curls 3lb 2x10       Shoulder Exercises: Pulleys   Flexion  2 minutes    ABduction  2 minutes      Shoulder Exercises: ROM/Strengthening   UBE (Upper Arm Bike)  L1 x3 min  each       Shoulder Exercises: Isometric Strengthening   Other Isometric Exercises  yellow band 2 step ER &IR x10       Vasopneumatic   Number Minutes Vasopneumatic   15 minutes    Vasopnuematic Location   Shoulder    Vasopneumatic Pressure  Medium    Vasopneumatic  Temperature   34      Manual Therapy   Manual Therapy  Passive ROM    Manual therapy comments  all motions of the R shoulder with stretch at end range               PT Short Term Goals - 07/03/19 1024      PT SHORT TERM GOAL #1   Title  independent with initial HEP    Status  Achieved        PT Long Term Goals - 07/17/19 1884      PT LONG TERM GOAL #1   Title  decrease pain 50%    Status  Partially Met      PT LONG TERM GOAL #2   Title  AROM of the right shoulder to WFL's    Status  Partially Met      PT LONG TERM GOAL #3   Title  report no difficulty with dressing and doing hair    Status  On-going            Plan - 07/30/19 0844    Clinical Impression Statement  Overall pt is doing really well. She was able to achieve full ROM with AAROM in flexion and extension. Progressed to some Tband isometrics with light resistance. Postural cues  needed with bent over active assisted rows. ER stiffness remains with ER.    Stability/Clinical Decision Making  Evolving/Moderate complexity    Rehab Potential  Good    PT Frequency  3x / week    PT Duration  8 weeks    PT Next Visit Plan  slowly start ROM, follow RC repair protocol       Patient will benefit from skilled therapeutic intervention in order to improve the following deficits and impairments:  Pain, Postural dysfunction, Increased muscle spasms, Decreased scar mobility, Decreased activity tolerance, Decreased range of motion, Decreased strength, Impaired UE functional use, Impaired flexibility, Increased edema  Visit Diagnosis: Stiffness of right shoulder, not elsewhere classified  Localized edema  Acute pain of right shoulder     Problem List Patient Active Problem List   Diagnosis Date Noted  . S/P rotator cuff repair 03/18/2014    Scot Jun, PTA 07/30/2019, 8:46 AM  [address] [place], [place], [address] Phone: [phone]   Fax:  [phone]  Name: Stacey Kelly MRN: [id number] Date of Birth: [birth year]

## 2019-08-03 ENCOUNTER — Other Ambulatory Visit: Payer: Self-pay

## 2019-08-03 ENCOUNTER — Ambulatory Visit: Payer: PRIVATE HEALTH INSURANCE | Attending: Specialist | Admitting: Physical Therapy

## 2019-08-03 ENCOUNTER — Encounter: Payer: Self-pay | Admitting: Physical Therapy

## 2019-08-03 DIAGNOSIS — M25511 Pain in right shoulder: Secondary | ICD-10-CM | POA: Diagnosis present

## 2019-08-03 DIAGNOSIS — R600 Localized edema: Secondary | ICD-10-CM | POA: Diagnosis present

## 2019-08-03 DIAGNOSIS — M25611 Stiffness of right shoulder, not elsewhere classified: Secondary | ICD-10-CM | POA: Diagnosis present

## 2019-08-03 NOTE — Therapy (Signed)
[place], [place], [address] Phone: [phone]   Fax:  [phone]  Physical Therapy Treatment  Patient Details  Name: Stacey Kelly MRN: [id number] Date of Birth: 23-Jan-1956 Referring Provider (PT): RA Theda Sers   Encounter Date: 08/03/2019  PT End of Session - 08/03/19 0845    Visit Number  11    Number of Visits  15    Authorization Type  W/C    PT Start Time  0805    PT Stop Time  0900    PT Time Calculation (min)  55 min    Activity Tolerance  Patient tolerated treatment well    Behavior During Therapy  [facility] for tasks assessed/performed       Past Medical History:  Diagnosis Date  . Depression   . Heart murmur    ASYMPTOMATIC--  ECHO  1995  MILD REGURG  . Migraine   . OA (osteoarthritis)    shoulders  . PONV (postoperative nausea and vomiting)   . Rotator cuff tear, right   . Vitamin D deficiency   . Wears glasses     Past Surgical History:  Procedure Laterality Date  . BUNIONECTOMY Right 2004   BOTH SIDES OF FOOT  . CRYOTHERAPY    . HARDWARE REMOVAL Right 04/15/2014   Procedure: EXCISION OF RIGHT FOOT DORSAL HARDWARE;  Surgeon: Wylene Simmer, MD;  Location: [place];  Service: Orthopedics;  Laterality: Right;  . RIGHT SHOULDER SURGERY  X3  LAST ONE 2010   INCLUDING ROTATOR CUFF REPAIR /  RECONSTRUCTION  . SHOULDER ARTHROSCOPY WITH ROTATOR CUFF REPAIR Left 03/18/2014   Procedure: LEFT SHOULDER ARTHROSCOPY WITH EVALUATION UNDER ANESTHESIA DEBRIDEMENT SUBACROMIAL DECOMPRESSION DISTAL CLAVICAL RESECTION ROTATOR CUFF REPAIR BICEPS  TENDINOSIS;  Surgeon: Sydnee Cabal, MD;  Location: [place];  Service: Orthopedics;  Laterality: Left;  . SHOULDER ARTHROSCOPY WITH ROTATOR CUFF REPAIR Right 06/23/2019   Procedure: SHOULDER ARTHROSCOPY WITH ROTATOR CUFF REPAIR , biceps tenotomy;  Surgeon: Sydnee Cabal, MD;  Location: [facility];  Service:  Orthopedics;  Laterality: Right;  . TONSILLECTOMY  AGE 63    There were no vitals filed for this visit.  Subjective Assessment - 08/03/19 0809    Subjective  "Just tight tight tight"    Currently in Pain?  No/denies    Pain Location  Shoulder    Pain Orientation  Right    Pain Descriptors / Indicators  Tightness                       [facility] Adult PT Treatment/Exercise - 08/03/19 0001      Shoulder Exercises: Standing   Other Standing Exercises  AAROM flex, Ext, IR up back 2x10, AAROM bent over rows cane 2x10, Pulley Tricept ext 10lb 2x10    Other Standing Exercises  hammer curls 3lb 2x10       Shoulder Exercises: Pulleys   Flexion  2 minutes    ABduction  2 minutes      Shoulder Exercises: ROM/Strengthening   UBE (Upper Arm Bike)  L2 x3 min  each       Shoulder Exercises: Isometric Strengthening   Other Isometric Exercises  yellow band 2 step ER &IR x7      Vasopneumatic   Number Minutes Vasopneumatic   15 minutes    Vasopnuematic Location   Shoulder    Vasopneumatic Pressure  Medium    Vasopneumatic Temperature  34      Manual Therapy   Manual Therapy  Passive ROM    Manual therapy comments  all motions of the R shoulder with stretch at end range               PT Short Term Goals - 07/03/19 1024      PT SHORT TERM GOAL #1   Title  independent with initial HEP    Status  Achieved        PT Long Term Goals - 08/03/19 0846      PT LONG TERM GOAL #1   Title  decrease pain 50%    Status  Achieved      PT LONG TERM GOAL #2   Title  AROM of the right shoulder to WFL's    Status  On-going      PT LONG TERM GOAL #3   Title  report no difficulty with dressing and doing hair    Status  On-going      PT LONG TERM GOAL #4   Title  sleep without pain down the arm    Status  Partially Met      PT LONG TERM GOAL #5   Title  lift 5# overhead with right arm    Status  On-going            Plan - 08/03/19 0847    Clinical  Impression Statement  Pt continues to do really well in her phase of recovery. Good carryover with AAROM flexion and extensions. Some fatigue with R shoulder external rotation isometrics. Some tightness with external and internal rotation PROM.    Stability/Clinical Decision Making  Evolving/Moderate complexity    Rehab Potential  Good    PT Frequency  3x / week    PT Treatment/Interventions  ADLs/Self Care Home Management;Cryotherapy;Electrical Stimulation;Therapeutic activities;Therapeutic exercise;Patient/family education;Manual techniques;Vasopneumatic Device    PT Next Visit Plan  slowly start ROM, follow RC repair protocol       Patient will benefit from skilled therapeutic intervention in order to improve the following deficits and impairments:  Pain, Postural dysfunction, Increased muscle spasms, Decreased scar mobility, Decreased activity tolerance, Decreased range of motion, Decreased strength, Impaired UE functional use, Impaired flexibility, Increased edema  Visit Diagnosis: Localized edema  Acute pain of right shoulder  Stiffness of right shoulder, not elsewhere classified     Problem List Patient Active Problem List   Diagnosis Date Noted  . S/P rotator cuff repair 03/18/2014    Scot Jun, PTA 08/03/2019, 8:50 AM  [address] [place], [place], [address] Phone: [phone]   Fax:  [phone]  Name: TAWNIE EHRESMAN MRN: [id number] Date of Birth: August 06, 1956

## 2019-08-06 ENCOUNTER — Ambulatory Visit: Payer: PRIVATE HEALTH INSURANCE | Admitting: Physical Therapy

## 2019-08-06 ENCOUNTER — Encounter: Payer: Self-pay | Admitting: Physical Therapy

## 2019-08-06 ENCOUNTER — Other Ambulatory Visit: Payer: Self-pay

## 2019-08-06 DIAGNOSIS — M25511 Pain in right shoulder: Secondary | ICD-10-CM

## 2019-08-06 DIAGNOSIS — M25611 Stiffness of right shoulder, not elsewhere classified: Secondary | ICD-10-CM

## 2019-08-06 DIAGNOSIS — R600 Localized edema: Secondary | ICD-10-CM

## 2019-08-06 NOTE — Therapy (Signed)
[place] [address] [place], [place], [address] Phone: [phone]   Fax:  [phone]  Physical Therapy Treatment  Patient Details  Name: Stacey Kelly MRN: [id number] Date of Birth: 15-Apr-1956 Referring Provider (PT): RA Theda Sers   Encounter Date: 08/06/2019  PT End of Session - 08/06/19 0927    Visit Number  12    Number of Visits  15    PT Start Time  0845    PT Stop Time  0941    PT Time Calculation (min)  56 min    Activity Tolerance  Patient tolerated treatment well    Behavior During Therapy  [facility] for tasks assessed/performed       Past Medical History:  Diagnosis Date  . Depression   . Heart murmur    ASYMPTOMATIC--  ECHO  1995  MILD REGURG  . Migraine   . OA (osteoarthritis)    shoulders  . PONV (postoperative nausea and vomiting)   . Rotator cuff tear, right   . Vitamin D deficiency   . Wears glasses     Past Surgical History:  Procedure Laterality Date  . BUNIONECTOMY Right 2004   BOTH SIDES OF FOOT  . CRYOTHERAPY    . HARDWARE REMOVAL Right 04/15/2014   Procedure: EXCISION OF RIGHT FOOT DORSAL HARDWARE;  Surgeon: Wylene Simmer, MD;  Location: [place];  Service: Orthopedics;  Laterality: Right;  . RIGHT SHOULDER SURGERY  X3  LAST ONE 2010   INCLUDING ROTATOR CUFF REPAIR /  RECONSTRUCTION  . SHOULDER ARTHROSCOPY WITH ROTATOR CUFF REPAIR Left 03/18/2014   Procedure: LEFT SHOULDER ARTHROSCOPY WITH EVALUATION UNDER ANESTHESIA DEBRIDEMENT SUBACROMIAL DECOMPRESSION DISTAL CLAVICAL RESECTION ROTATOR CUFF REPAIR BICEPS  TENDINOSIS;  Surgeon: Sydnee Cabal, MD;  Location: [place];  Service: Orthopedics;  Laterality: Left;  . SHOULDER ARTHROSCOPY WITH ROTATOR CUFF REPAIR Right 06/23/2019   Procedure: SHOULDER ARTHROSCOPY WITH ROTATOR CUFF REPAIR , biceps tenotomy;  Surgeon: Sydnee Cabal, MD;  Location: [facility];  Service: Orthopedics;  Laterality:  Right;  . TONSILLECTOMY  AGE 17    There were no vitals filed for this visit.  Subjective Assessment - 08/06/19 0851    Subjective  "Just sore and stiff, last night I had trouble sleeping"    Currently in Pain?  Yes    Pain Score  1     Pain Location  Shoulder    Pain Orientation  Right    Pain Descriptors / Indicators  Nagging                       [facility] Adult PT Treatment/Exercise - 08/06/19 0001      Shoulder Exercises: Sidelying   External Rotation  Right;AROM;20 reps      Shoulder Exercises: Standing   Flexion  Both;10 reps;AROM    ABduction  AROM;Both;10 reps    Row  Strengthening;Both;20 reps;Theraband    Theraband Level (Shoulder Row)  Level 1 (Yellow)    Other Standing Exercises  AAROM flex, Ext, IR up back x10, ent over rows cane 2x10 2lb, Pulley Tricept ext 15lb 2x10    Other Standing Exercises  hammer curls 3lb 2x10       Shoulder Exercises: ROM/Strengthening   UBE (Upper Arm Bike)  L3 x3 min  each       Shoulder Exercises: Body Blade   Other Body Blade Exercises  Arm down by the side flex?ext arm 2x  15 sec      Vasopneumatic   Number Minutes Vasopneumatic   15 minutes    Vasopnuematic Location   Shoulder    Vasopneumatic Pressure  Medium    Vasopneumatic Temperature   34      Manual Therapy   Manual Therapy  Passive ROM    Manual therapy comments  all motions of the R shoulder with stretch at end range               PT Short Term Goals - 07/03/19 1024      PT SHORT TERM GOAL #1   Title  independent with initial HEP    Status  Achieved        PT Long Term Goals - 08/03/19 0846      PT LONG TERM GOAL #1   Title  decrease pain 50%    Status  Achieved      PT LONG TERM GOAL #2   Title  AROM of the right shoulder to WFL's    Status  On-going      PT LONG TERM GOAL #3   Title  report no difficulty with dressing and doing hair    Status  On-going      PT LONG TERM GOAL #4   Title  sleep without pain down the arm     Status  Partially Met      PT LONG TERM GOAL #5   Title  lift 5# overhead with right arm    Status  On-going            Plan - 08/06/19 0928    Clinical Impression Statement  Pt is doing well within the limitations of protocol. Progressed to some AROM for the first time, some R shoulder elevation noted with flex and abduction. She doe reports a popping sensation with the AROM. She did well with sidelying ER. Some limitation noted with passive ER    Stability/Clinical Decision Making  Evolving/Moderate complexity    Rehab Potential  Good    PT Frequency  3x / week    PT Duration  8 weeks    PT Treatment/Interventions  ADLs/Self Care Home Management;Cryotherapy;Electrical Stimulation;Therapeutic activities;Therapeutic exercise;Patient/family education;Manual techniques;Vasopneumatic Device    PT Next Visit Plan  slowly start ROM, follow RC repair protocol       Patient will benefit from skilled therapeutic intervention in order to improve the following deficits and impairments:  Pain, Postural dysfunction, Increased muscle spasms, Decreased scar mobility, Decreased activity tolerance, Decreased range of motion, Decreased strength, Impaired UE functional use, Impaired flexibility, Increased edema  Visit Diagnosis: Localized edema  Acute pain of right shoulder  Stiffness of right shoulder, not elsewhere classified     Problem List Patient Active Problem List   Diagnosis Date Noted  . S/P rotator cuff repair 03/18/2014    Scot Jun, PTA 08/06/2019, 9:31 AM  [address] [place], [place], [address] Phone: [phone]   Fax:  [phone]  Name: Stacey Kelly MRN: [id number] Date of Birth: Mar 20, 1956

## 2019-08-10 ENCOUNTER — Ambulatory Visit: Payer: PRIVATE HEALTH INSURANCE | Attending: Specialist | Admitting: Physical Therapy

## 2019-08-10 ENCOUNTER — Encounter: Payer: Self-pay | Admitting: Physical Therapy

## 2019-08-10 ENCOUNTER — Other Ambulatory Visit: Payer: Self-pay

## 2019-08-10 DIAGNOSIS — M25611 Stiffness of right shoulder, not elsewhere classified: Secondary | ICD-10-CM | POA: Diagnosis present

## 2019-08-10 DIAGNOSIS — M25511 Pain in right shoulder: Secondary | ICD-10-CM | POA: Diagnosis present

## 2019-08-10 DIAGNOSIS — R600 Localized edema: Secondary | ICD-10-CM | POA: Diagnosis present

## 2019-08-10 NOTE — Therapy (Signed)
[place], [place], [address] Phone: [phone]   Fax:  [phone]  Physical Therapy Treatment  Patient Details  Name: Stacey Kelly MRN: [id number] Date of Birth: Nov 09, 1955 Referring Provider (PT): RA Theda Sers   Encounter Date: 08/10/2019  PT End of Session - 08/10/19 0845    Visit Number  13    Number of Visits  15    PT Start Time  0804    PT Stop Time  0901    PT Time Calculation (min)  57 min    Activity Tolerance  Patient tolerated treatment well    Behavior During Therapy  [facility] for tasks assessed/performed       Past Medical History:  Diagnosis Date  . Depression   . Heart murmur    ASYMPTOMATIC--  ECHO  1995  MILD REGURG  . Migraine   . OA (osteoarthritis)    shoulders  . PONV (postoperative nausea and vomiting)   . Rotator cuff tear, right   . Vitamin D deficiency   . Wears glasses     Past Surgical History:  Procedure Laterality Date  . BUNIONECTOMY Right 2004   BOTH SIDES OF FOOT  . CRYOTHERAPY    . HARDWARE REMOVAL Right 04/15/2014   Procedure: EXCISION OF RIGHT FOOT DORSAL HARDWARE;  Surgeon: Wylene Simmer, MD;  Location: [place];  Service: Orthopedics;  Laterality: Right;  . RIGHT SHOULDER SURGERY  X3  LAST ONE 2010   INCLUDING ROTATOR CUFF REPAIR /  RECONSTRUCTION  . SHOULDER ARTHROSCOPY WITH ROTATOR CUFF REPAIR Left 03/18/2014   Procedure: LEFT SHOULDER ARTHROSCOPY WITH EVALUATION UNDER ANESTHESIA DEBRIDEMENT SUBACROMIAL DECOMPRESSION DISTAL CLAVICAL RESECTION ROTATOR CUFF REPAIR BICEPS  TENDINOSIS;  Surgeon: Sydnee Cabal, MD;  Location: [place];  Service: Orthopedics;  Laterality: Left;  . SHOULDER ARTHROSCOPY WITH ROTATOR CUFF REPAIR Right 06/23/2019   Procedure: SHOULDER ARTHROSCOPY WITH ROTATOR CUFF REPAIR , biceps tenotomy;  Surgeon: Sydnee Cabal, MD;  Location: [facility];  Service: Orthopedics;  Laterality:  Right;  . TONSILLECTOMY  AGE 4    There were no vitals filed for this visit.  Subjective Assessment - 08/10/19 0805    Subjective  ""Its going good, It the top of my shoulder now" Stopped wearing sling Friday    Limitations  Lifting;House hold activities    Currently in Pain?  No/denies   some at night if she rolls on it        [facility] PT Assessment - 08/10/19 0001      ROM / Strength   AROM / PROM / Strength  AROM      AROM   Overall AROM   --    AROM Assessment Site  Shoulder   AROM taken in supine   Right/Left Shoulder  Right    Right Shoulder Flexion  160 Degrees    Right Shoulder ABduction  151 Degrees    Right Shoulder Internal Rotation  46 Degrees   absd to 660   Right Shoulder External Rotation  47 Degrees   ABD to 60                  [facility] Adult PT Treatment/Exercise - 08/10/19 0001      Shoulder Exercises: Standing   External Rotation  AROM;Both;15 reps   x2   Flexion  Both;AROM;20 reps    ABduction  AROM;Both;20 reps    Extension  Strengthening;Both;20 reps;Theraband  yellow tband   Theraband Level (Shoulder Extension)  Level 1 (Yellow)    Row  Strengthening;Both;20 reps;Theraband    Theraband Level (Shoulder Row)  Level 1 (Yellow)    Other Standing Exercises  AAROM flex with cane x15    Other Standing Exercises  shoulder shrugs with 3# x15, bicep curls 2# 2x10      Shoulder Exercises: ROM/Strengthening   UBE (Upper Arm Bike)  L3 x3 min  each       Vasopneumatic   Number Minutes Vasopneumatic   15 minutes    Vasopnuematic Location   Shoulder    Vasopneumatic Pressure  Medium    Vasopneumatic Temperature   34      Manual Therapy   Manual Therapy  Passive ROM    Manual therapy comments  all motions of the R shoulder with stretch at end range               PT Short Term Goals - 07/03/19 1024      PT SHORT TERM GOAL #1   Title  independent with initial HEP    Status  Achieved        PT Long Term Goals - 08/03/19 0846       PT LONG TERM GOAL #1   Title  decrease pain 50%    Status  Achieved      PT LONG TERM GOAL #2   Title  AROM of the right shoulder to WFL's    Status  On-going      PT LONG TERM GOAL #3   Title  report no difficulty with dressing and doing hair    Status  On-going      PT LONG TERM GOAL #4   Title  sleep without pain down the arm    Status  Partially Met      PT LONG TERM GOAL #5   Title  lift 5# overhead with right arm    Status  On-going            Plan - 08/10/19 0846    Clinical Impression Statement  Pt continues to do well within protocol. All AROM taken in the supine position. Pt was able to maintain good posture with standing rows and extension. Some R shoulder elevation noted with active flex and abduction. No reports of pain with the exercises.Some ROM restrictions with passive ER/IR    Stability/Clinical Decision Making  Evolving/Moderate complexity    Rehab Potential  Good    PT Treatment/Interventions  ADLs/Self Care Home Management;Cryotherapy;Electrical Stimulation;Therapeutic activities;Therapeutic exercise;Patient/family education;Manual techniques;Vasopneumatic Device    PT Next Visit Plan  slowly start ROM, follow RC repair protocol       Patient will benefit from skilled therapeutic intervention in order to improve the following deficits and impairments:  Pain, Postural dysfunction, Increased muscle spasms, Decreased scar mobility, Decreased activity tolerance, Decreased range of motion, Decreased strength, Impaired UE functional use, Impaired flexibility, Increased edema  Visit Diagnosis: Acute pain of right shoulder  Stiffness of right shoulder, not elsewhere classified  Localized edema     Problem List Patient Active Problem List   Diagnosis Date Noted  . S/P rotator cuff repair 03/18/2014    Scot Jun, PTA 08/10/2019, 8:53 AM  [address]  [place], [place], [address] Phone: [phone]   Fax:  [phone]  Name: Stacey [place] MRN: [id number] Date of Birth: 12-Nov-1955

## 2019-08-13 ENCOUNTER — Ambulatory Visit: Payer: PRIVATE HEALTH INSURANCE | Attending: Specialist | Admitting: Physical Therapy

## 2019-08-13 ENCOUNTER — Encounter: Payer: Self-pay | Admitting: Physical Therapy

## 2019-08-13 ENCOUNTER — Other Ambulatory Visit: Payer: Self-pay

## 2019-08-13 DIAGNOSIS — M25511 Pain in right shoulder: Secondary | ICD-10-CM | POA: Diagnosis present

## 2019-08-13 DIAGNOSIS — R600 Localized edema: Secondary | ICD-10-CM | POA: Diagnosis present

## 2019-08-13 DIAGNOSIS — M25611 Stiffness of right shoulder, not elsewhere classified: Secondary | ICD-10-CM | POA: Diagnosis present

## 2019-08-13 NOTE — Therapy (Signed)
[address] [place], [place], [address] Phone: [phone]   Fax:  [phone]  Physical Therapy Treatment  Patient Details  Name: Stacey Kelly MRN: [id number] Date of Birth: 1956-03-06 Referring Provider (PT): RA Theda Sers   Encounter Date: 08/13/2019  PT End of Session - 08/13/19 0843    Visit Number  14    Number of Visits  15    Authorization Type  W/C    PT Start Time  0805    PT Stop Time  0859    PT Time Calculation (min)  54 min    Activity Tolerance  Patient tolerated treatment well    Behavior During Therapy  [facility] for tasks assessed/performed       Past Medical History:  Diagnosis Date  . Depression   . Heart murmur    ASYMPTOMATIC--  ECHO  1995  MILD REGURG  . Migraine   . OA (osteoarthritis)    shoulders  . PONV (postoperative nausea and vomiting)   . Rotator cuff tear, right   . Vitamin D deficiency   . Wears glasses     Past Surgical History:  Procedure Laterality Date  . BUNIONECTOMY Right 2004   BOTH SIDES OF FOOT  . CRYOTHERAPY    . HARDWARE REMOVAL Right 04/15/2014   Procedure: EXCISION OF RIGHT FOOT DORSAL HARDWARE;  Surgeon: Wylene Simmer, MD;  Location: [place];  Service: Orthopedics;  Laterality: Right;  . RIGHT SHOULDER SURGERY  X3  LAST ONE 2010   INCLUDING ROTATOR CUFF REPAIR /  RECONSTRUCTION  . SHOULDER ARTHROSCOPY WITH ROTATOR CUFF REPAIR Left 03/18/2014   Procedure: LEFT SHOULDER ARTHROSCOPY WITH EVALUATION UNDER ANESTHESIA DEBRIDEMENT SUBACROMIAL DECOMPRESSION DISTAL CLAVICAL RESECTION ROTATOR CUFF REPAIR BICEPS  TENDINOSIS;  Surgeon: Sydnee Cabal, MD;  Location: [place];  Service: Orthopedics;  Laterality: Left;  . SHOULDER ARTHROSCOPY WITH ROTATOR CUFF REPAIR Right 06/23/2019   Procedure: SHOULDER ARTHROSCOPY WITH ROTATOR CUFF REPAIR , biceps tenotomy;  Surgeon: Sydnee Cabal, MD;  Location[facility];   Service: Orthopedics;  Laterality: Right;  . TONSILLECTOMY  AGE 11    There were no vitals filed for this visit.  Subjective Assessment - 08/13/19 0808    Subjective  "I am good, just every morning stiff"    Currently in Pain?  No/denies                       [facility] Adult PT Treatment/Exercise - 08/13/19 0001      Shoulder Exercises: Seated   Other Seated Exercises  bent over rows & ext 1lb, Rev flys No weight 2x10       Shoulder Exercises: Standing   Flexion  Both;AROM;20 reps    Extension  Strengthening;Both;20 reps;Theraband    Theraband Level (Shoulder Extension)  Level 1 (Yellow)    Row  Strengthening;Both;20 reps;Theraband    Theraband Level (Shoulder Row)  Level 1 (Yellow)    Other Standing Exercises  Triceps ext red tband 2x10    Other Standing Exercises  shoulder shrugs with 3# x15, bicep curls 3# 2x10      Shoulder Exercises: ROM/Strengthening   UBE (Upper Arm Bike)  L3 x3 min  each       Vasopneumatic   Number Minutes Vasopneumatic   15 minutes    Vasopnuematic Location   Shoulder    Vasopneumatic Pressure  Medium    Vasopneumatic Temperature   34  Manual Therapy   Manual Therapy  Passive ROM    Manual therapy comments  all motions of the R shoulder with stretch at end range               PT Short Term Goals - 07/03/19 1024      PT SHORT TERM GOAL #1   Title  independent with initial HEP    Status  Achieved        PT Long Term Goals - 08/03/19 0846      PT LONG TERM GOAL #1   Title  decrease pain 50%    Status  Achieved      PT LONG TERM GOAL #2   Title  AROM of the right shoulder to WFL's    Status  On-going      PT LONG TERM GOAL #3   Title  report no difficulty with dressing and doing hair    Status  On-going      PT LONG TERM GOAL #4   Title  sleep without pain down the arm    Status  Partially Met      PT LONG TERM GOAL #5   Title  lift 5# overhead with right arm    Status  On-going             Plan - 08/13/19 0844    Clinical Impression Statement  Pt did really well with today's treatment. No issues with the new bent over exercises using little to no weight, she did needed cues for posture. R shoulder elevation noted with active flexion, tactile cues needed try an correct. AROM remains well.    Stability/Clinical Decision Making  Evolving/Moderate complexity    Rehab Potential  Good    PT Frequency  3x / week    PT Duration  8 weeks    PT Treatment/Interventions  ADLs/Self Care Home Management;Cryotherapy;Electrical Stimulation;Therapeutic activities;Therapeutic exercise;Patient/family education;Manual techniques;Vasopneumatic Device    PT Next Visit Plan  slowly start ROM, follow RC repair protocol       Patient will benefit from skilled therapeutic intervention in order to improve the following deficits and impairments:  Pain, Postural dysfunction, Increased muscle spasms, Decreased scar mobility, Decreased activity tolerance, Decreased range of motion, Decreased strength, Impaired UE functional use, Impaired flexibility, Increased edema  Visit Diagnosis: Stiffness of right shoulder, not elsewhere classified  Localized edema  Acute pain of right shoulder     Problem List Patient Active Problem List   Diagnosis Date Noted  . S/P rotator cuff repair 03/18/2014    Scot Jun, PTA 08/13/2019, 8:47 AM  [place], [place], [address] Phone: [phone]   Fax:  [phone]  Name: Stacey [place] MRN: [id number] Date of Birth: [birth year]

## 2019-08-17 ENCOUNTER — Ambulatory Visit: Payer: PRIVATE HEALTH INSURANCE | Attending: Specialist | Admitting: Physical Therapy

## 2019-08-17 ENCOUNTER — Other Ambulatory Visit: Payer: Self-pay

## 2019-08-17 ENCOUNTER — Encounter: Payer: Self-pay | Admitting: Physical Therapy

## 2019-08-17 DIAGNOSIS — M25611 Stiffness of right shoulder, not elsewhere classified: Secondary | ICD-10-CM | POA: Insufficient documentation

## 2019-08-17 DIAGNOSIS — M25511 Pain in right shoulder: Secondary | ICD-10-CM | POA: Diagnosis present

## 2019-08-17 DIAGNOSIS — R600 Localized edema: Secondary | ICD-10-CM | POA: Diagnosis present

## 2019-08-17 NOTE — Therapy (Signed)
[place] [address] [place], [place], [address] Phone: [phone]   Fax:  [phone]  Physical Therapy Treatment  Patient Details  Name: Stacey [place] MRN: [id number] Date of Birth: 05-25-1956 Referring Provider (PT): RA Theda Sers   Encounter Date: 08/17/2019  PT End of Session - 08/17/19 1029    Visit Number  15    Authorization Type  W/C    PT Start Time  0845    PT Stop Time  0940    PT Time Calculation (min)  55 min    Activity Tolerance  Patient tolerated treatment well    Behavior During Therapy  [facility] for tasks assessed/performed       Past Medical History:  Diagnosis Date  . Depression   . Heart murmur    ASYMPTOMATIC--  ECHO  1995  MILD REGURG  . Migraine   . OA (osteoarthritis)    shoulders  . PONV (postoperative nausea and vomiting)   . Rotator cuff tear, right   . Vitamin D deficiency   . Wears glasses     Past Surgical History:  Procedure Laterality Date  . BUNIONECTOMY Right 2004   BOTH SIDES OF FOOT  . CRYOTHERAPY    . HARDWARE REMOVAL Right 04/15/2014   Procedure: EXCISION OF RIGHT FOOT DORSAL HARDWARE;  Surgeon: Wylene Simmer, MD;  Location: [place];  Service: Orthopedics;  Laterality: Right;  . RIGHT SHOULDER SURGERY  X3  LAST ONE 2010   INCLUDING ROTATOR CUFF REPAIR /  RECONSTRUCTION  . SHOULDER ARTHROSCOPY WITH ROTATOR CUFF REPAIR Left 03/18/2014   Procedure: LEFT SHOULDER ARTHROSCOPY WITH EVALUATION UNDER ANESTHESIA DEBRIDEMENT SUBACROMIAL DECOMPRESSION DISTAL CLAVICAL RESECTION ROTATOR CUFF REPAIR BICEPS  TENDINOSIS;  Surgeon: Sydnee Cabal, MD;  Location: [place];  Service: Orthopedics;  Laterality: Left;  . SHOULDER ARTHROSCOPY WITH ROTATOR CUFF REPAIR Right 06/23/2019   Procedure: SHOULDER ARTHROSCOPY WITH ROTATOR CUFF REPAIR , biceps tenotomy;  Surgeon: Sydnee Cabal, MD;  Location: [facility];  Service: Orthopedics;   Laterality: Right;  . TONSILLECTOMY  AGE 67    There were no vitals filed for this visit.  Subjective Assessment - 08/17/19 0852    Subjective  I am getting better, but slowly    Currently in Pain?  Yes    Pain Score  2     Pain Location  Shoulder    Pain Orientation  Right    Pain Descriptors / Indicators  Tightness    Aggravating Factors   some difficulty sleeping                       [facility] Adult PT Treatment/Exercise - 08/17/19 0001      Shoulder Exercises: Supine   External Rotation  AROM;20 reps    External Rotation Weight (lbs)  1    External Rotation Limitations  from belly to neutral    Flexion  Right;20 reps;Weights    Shoulder Flexion Weight (lbs)  1#    Flexion Limitations  in a small arc of motions from 70 -110 degrees    Other Supine Exercises  serratus punches x15  3#, 3# isometric circles      Shoulder Exercises: Standing   Other Standing Exercises  shoulder shrugs with 3# x15, bicep curls 3# 2x10. ball pass around the waist two directions      Shoulder Exercises: ROM/Strengthening   UBE (Upper Arm Bike)  L3 x3 min  each     Wall Wash  flexion, CCW/CW at head high, over head side to side, scaption with assist    "W" Arms  with PT overpressure      Shoulder Exercises: Stretch   Corner Stretch  4 reps;10 seconds    Corner Stretch Limitations  did in door way      Vasopneumatic   Number Minutes Vasopneumatic   10 minutes    Vasopnuematic Location   Shoulder    Vasopneumatic Pressure  Medium    Vasopneumatic Temperature   34      Manual Therapy   Manual Therapy  Passive ROM    Passive ROM  all PROM to end ranges                PT Short Term Goals - 07/03/19 1024      PT SHORT TERM GOAL #1   Title  independent with initial HEP    Status  Achieved        PT Long Term Goals - 08/17/19 1033      PT LONG TERM GOAL #2   Title  AROM of the right shoulder to WFL's    Status  Partially Met      PT LONG TERM GOAL #3   Title   report no difficulty with dressing and doing hair    Status  Partially Met            Plan - 08/17/19 1030    Clinical Impression Statement  Patient is really doing well, she is tight mostly in rotational motions.  She has a painful and weak arc, she is 7 weeks out and next week could start to add some more strength.    PT Next Visit Plan  Continue to work on ROM and next week can add more strength    Consulted and Agree with Plan of Care  Patient       Patient will benefit from skilled therapeutic intervention in order to improve the following deficits and impairments:  Pain, Postural dysfunction, Increased muscle spasms, Decreased scar mobility, Decreased activity tolerance, Decreased range of motion, Decreased strength, Impaired UE functional use, Impaired flexibility, Increased edema  Visit Diagnosis: Stiffness of right shoulder, not elsewhere classified  Localized edema  Acute pain of right shoulder     Problem List Patient Active Problem List   Diagnosis Date Noted  . S/P rotator cuff repair 03/18/2014    Sumner Boast., PT 08/17/2019, 10:34 AM  [address] [place], [place], [address] Phone: [phone]   Fax:  [phone]  Name: Stacey [place] MRN: [id number] Date of Birth: 1956-04-23

## 2019-08-20 ENCOUNTER — Encounter: Payer: Self-pay | Admitting: Physical Therapy

## 2019-08-20 ENCOUNTER — Ambulatory Visit: Payer: PRIVATE HEALTH INSURANCE | Attending: Specialist | Admitting: Physical Therapy

## 2019-08-20 ENCOUNTER — Other Ambulatory Visit: Payer: Self-pay

## 2019-08-20 DIAGNOSIS — M25611 Stiffness of right shoulder, not elsewhere classified: Secondary | ICD-10-CM | POA: Diagnosis present

## 2019-08-20 DIAGNOSIS — M25511 Pain in right shoulder: Secondary | ICD-10-CM | POA: Diagnosis not present

## 2019-08-20 DIAGNOSIS — R600 Localized edema: Secondary | ICD-10-CM | POA: Insufficient documentation

## 2019-08-20 NOTE — Therapy (Signed)
Stacey Kelly, [place], [address] Phone: [phone]   Fax:  [phone]  Physical Therapy Treatment  Patient Details  Name: Stacey Kelly MRN: [id number] Date of Birth: 08-20-1956 Referring Provider (PT): RA Theda Sers   Encounter Date: 08/20/2019  PT End of Session - 08/20/19 0847    Visit Number  16    Authorization Type  W/C    PT Start Time  0802    PT Stop Time  0858    PT Time Calculation (min)  56 min    Activity Tolerance  Patient tolerated treatment well    Behavior During Therapy  [facility] for tasks assessed/performed       Past Medical History:  Diagnosis Date  . Depression   . Heart murmur    ASYMPTOMATIC--  ECHO  1995  MILD REGURG  . Migraine   . OA (osteoarthritis)    shoulders  . PONV (postoperative nausea and vomiting)   . Rotator cuff tear, right   . Vitamin D deficiency   . Wears glasses     Past Surgical History:  Procedure Laterality Date  . BUNIONECTOMY Right 2004   BOTH SIDES OF FOOT  . CRYOTHERAPY    . HARDWARE REMOVAL Right 04/15/2014   Procedure: EXCISION OF RIGHT FOOT DORSAL HARDWARE;  Surgeon: Wylene Simmer, MD;  Location: [place];  Service: Orthopedics;  Laterality: Right;  . RIGHT SHOULDER SURGERY  X3  LAST ONE 2010   INCLUDING ROTATOR CUFF REPAIR /  RECONSTRUCTION  . SHOULDER ARTHROSCOPY WITH ROTATOR CUFF REPAIR Left 03/18/2014   Procedure: LEFT SHOULDER ARTHROSCOPY WITH EVALUATION UNDER ANESTHESIA DEBRIDEMENT SUBACROMIAL DECOMPRESSION DISTAL CLAVICAL RESECTION ROTATOR CUFF REPAIR BICEPS  TENDINOSIS;  Surgeon: Sydnee Cabal, MD;  Location: [place];  Service: Orthopedics;  Laterality: Left;  . SHOULDER ARTHROSCOPY WITH ROTATOR CUFF REPAIR Right 06/23/2019   Procedure: SHOULDER ARTHROSCOPY WITH ROTATOR CUFF REPAIR , biceps tenotomy;  Surgeon: Sydnee Cabal, MD;  Location: [facility];  Service: Orthopedics;   Laterality: Right;  . TONSILLECTOMY  AGE 63    There were no vitals filed for this visit.  Subjective Assessment - 08/20/19 0806    Subjective  Pt reports some tenderness on her R scapula with palpation, she reports that she has not done anything different other than moving a glass recycling bin    Currently in Pain?  No/denies                       [facility] Adult PT Treatment/Exercise - 08/20/19 0001      Shoulder Exercises: Seated   Other Seated Exercises  bent over rows & ext 1lb, Rev flys No weight 2x10       Shoulder Exercises: Sidelying   External Rotation  Right;AROM;10 reps   x10, then 10 eith jab     Shoulder Exercises: Standing   Flexion  Both;AROM;20 reps    Other Standing Exercises  shoulder shrugs with 3# x15, bicep curls 3# 2x10. ball pass around the waist two directions      Shoulder Exercises: ROM/Strengthening   UBE (Upper Arm Bike)  L3 x3 min  each       Shoulder Exercises: Stretch   Corner Stretch  4 reps;10 seconds    Corner Stretch Limitations  did in door way      Vasopneumatic   Number Minutes Vasopneumatic   10 minutes    Vasopnuematic Location  Shoulder    Vasopneumatic Pressure  Medium    Vasopneumatic Temperature   34      Manual Therapy   Manual Therapy  Passive ROM    Passive ROM  all PROM to end ranges                PT Short Term Goals - 07/03/19 1024      PT SHORT TERM GOAL #1   Title  independent with initial HEP    Status  Achieved        PT Long Term Goals - 08/17/19 1033      PT LONG TERM GOAL #2   Title  AROM of the right shoulder to WFL's    Status  Partially Met      PT LONG TERM GOAL #3   Title  report no difficulty with dressing and doing hair    Status  Partially Met            Plan - 08/20/19 0851    Clinical Impression Statement  Patient continues to do well overall, most difficulty with rotational motions. No reports of pain. R shoulder elevation with active flexion. Reports a good  pull with corner stretch.    Stability/Clinical Decision Making  Evolving/Moderate complexity    PT Treatment/Interventions  ADLs/Self Care Home Management;Cryotherapy;Electrical Stimulation;Therapeutic activities;Therapeutic exercise;Patient/family education;Manual techniques;Vasopneumatic Device    PT Next Visit Plan  Continue to work on ROM and next week can add more strength       Patient will benefit from skilled therapeutic intervention in order to improve the following deficits and impairments:  Pain, Postural dysfunction, Increased muscle spasms, Decreased scar mobility, Decreased activity tolerance, Decreased range of motion, Decreased strength, Impaired UE functional use, Impaired flexibility, Increased edema  Visit Diagnosis: Acute pain of right shoulder  Stiffness of right shoulder, not elsewhere classified  Localized edema     Problem List Patient Active Problem List   Diagnosis Date Noted  . S/P rotator cuff repair 03/18/2014    Scot Jun, PTA 08/20/2019, 8:53 AM  [address] [place], [place], [address] Phone: [phone]   Fax:  [phone]  Name: Stacey Kelly MRN: [id number] Date of Birth: 16-Aug-1958

## 2019-08-24 ENCOUNTER — Other Ambulatory Visit: Payer: Self-pay

## 2019-08-24 ENCOUNTER — Ambulatory Visit: Payer: PRIVATE HEALTH INSURANCE | Attending: Specialist | Admitting: Physical Therapy

## 2019-08-24 ENCOUNTER — Encounter: Payer: Self-pay | Admitting: Physical Therapy

## 2019-08-24 DIAGNOSIS — M25611 Stiffness of right shoulder, not elsewhere classified: Secondary | ICD-10-CM | POA: Insufficient documentation

## 2019-08-24 DIAGNOSIS — R600 Localized edema: Secondary | ICD-10-CM | POA: Insufficient documentation

## 2019-08-24 DIAGNOSIS — M25511 Pain in right shoulder: Secondary | ICD-10-CM | POA: Insufficient documentation

## 2019-08-24 NOTE — Therapy (Signed)
[place] [place], [place], [address] Phone: [phone]   Fax:  [phone]  Physical Therapy Treatment  Patient Details  Name: Stacey Kelly MRN: [id number] Date of Birth: [birth year] Referring Provider (PT): RA Theda Sers   Encounter Date: 08/24/2019  PT End of Session - 08/24/19 1000    Visit Number  17    Authorization Type  W/C    Activity Tolerance  Patient tolerated treatment well    Behavior During Therapy  [facility] for tasks assessed/performed       Past Medical History:  Diagnosis Date  . Depression   . Heart murmur    ASYMPTOMATIC--  ECHO  1995  MILD REGURG  . Migraine   . OA (osteoarthritis)    shoulders  . PONV (postoperative nausea and vomiting)   . Rotator cuff tear, right   . Vitamin D deficiency   . Wears glasses     Past Surgical History:  Procedure Laterality Date  . BUNIONECTOMY Right 2004   BOTH SIDES OF FOOT  . CRYOTHERAPY    . HARDWARE REMOVAL Right 04/15/2014   Procedure: EXCISION OF RIGHT FOOT DORSAL HARDWARE;  Surgeon: Wylene Simmer, MD;  Location: [place];  Service: Orthopedics;  Laterality: Right;  . RIGHT SHOULDER SURGERY  X3  LAST ONE 2010   INCLUDING ROTATOR CUFF REPAIR /  RECONSTRUCTION  . SHOULDER ARTHROSCOPY WITH ROTATOR CUFF REPAIR Left 03/18/2014   Procedure: LEFT SHOULDER ARTHROSCOPY WITH EVALUATION UNDER ANESTHESIA DEBRIDEMENT SUBACROMIAL DECOMPRESSION DISTAL CLAVICAL RESECTION ROTATOR CUFF REPAIR BICEPS  TENDINOSIS;  Surgeon: Sydnee Cabal, MD;  Location: [place];  Service: Orthopedics;  Laterality: Left;  . SHOULDER ARTHROSCOPY WITH ROTATOR CUFF REPAIR Right 06/23/2019   Procedure: SHOULDER ARTHROSCOPY WITH ROTATOR CUFF REPAIR , biceps tenotomy;  Surgeon: Sydnee Cabal, MD;  Location: [facility];  Service: Orthopedics;  Laterality: Right;  . TONSILLECTOMY  AGE 63    There were no vitals filed for this  visit.  Subjective Assessment - 08/24/19 0851    Subjective  Reports that she is still sore int he back area, but doing well overall    Currently in Pain?  No/denies                       [facility] Adult PT Treatment/Exercise - 08/24/19 0001      Shoulder Exercises: Seated   Other Seated Exercises  bent over rows & ext 2lb, Rev flys No weight 2x10       Shoulder Exercises: Sidelying   External Rotation  Right;20 reps;Weights    External Rotation Weight (lbs)  2    External Rotation Limitations  worked some on eccentrics      Shoulder Exercises: Standing   External Rotation  Strengthening;Both;20 reps;Theraband    Theraband Level (Shoulder External Rotation)  Level 1 (Yellow)    Internal Rotation  Right;20 reps;Theraband    Theraband Level (Shoulder Internal Rotation)  Level 1 (Yellow)    Internal Rotation Limitations  then weighted ball pass for IR both directions    Other Standing Exercises  ball rolling up wall, ball on wall some approximation with circles cw/ccw      Shoulder Exercises: ROM/Strengthening   UBE (Upper Arm Bike)  L3 x3 min  each direction    Lat Pull  1.5 plate;20 reps    Cybex Row  1.5 plate;20 reps    Wall Wash  flexion, CCW/CW at  head high, over head side to side, scaption with assist    "W" Arms  with PT overpressure    Other ROM/Strengthening Exercises  5#rows and straight arm pulls on the pulleys                PT Short Term Goals - 07/03/19 1024      PT SHORT TERM GOAL #1   Title  independent with initial HEP    Status  Achieved        PT Long Term Goals - 08/17/19 1033      PT LONG TERM GOAL #2   Title  AROM of the right shoulder to WFL's    Status  Partially Met      PT LONG TERM GOAL #3   Title  report no difficulty with dressing and doing hair    Status  Partially Met            Plan - 08/24/19 1002    Clinical Impression Statement  She is two months out from surgery, we are slowly progressing with PRE's,  she is having some pain in the infraspinatus and the teres.  She also has some popping in the right biceps but can correct with verbal and tactile cues to retract the scapulae    PT Next Visit Plan  slowly continue to advance as tolerated    Consulted and Agree with Plan of Care  Patient       Patient will benefit from skilled therapeutic intervention in order to improve the following deficits and impairments:  Pain, Postural dysfunction, Increased muscle spasms, Decreased scar mobility, Decreased activity tolerance, Decreased range of motion, Decreased strength, Impaired UE functional use, Impaired flexibility, Increased edema  Visit Diagnosis: Acute pain of right shoulder  Stiffness of right shoulder, not elsewhere classified  Localized edema     Problem List Patient Active Problem List   Diagnosis Date Noted  . S/P rotator cuff repair 03/18/2014    Sumner Boast., PT 08/24/2019, 10:09 AM  [address] [place], [place], [address] Phone: [phone]   Fax:  [phone]  Name: Stacey Kelly MRN: [id number] Date of Birth: 10/08/1956

## 2019-08-27 ENCOUNTER — Encounter: Payer: Self-pay | Admitting: Physical Therapy

## 2019-08-27 ENCOUNTER — Other Ambulatory Visit: Payer: Self-pay

## 2019-08-27 ENCOUNTER — Ambulatory Visit: Payer: PRIVATE HEALTH INSURANCE | Attending: Specialist | Admitting: Physical Therapy

## 2019-08-27 DIAGNOSIS — R600 Localized edema: Secondary | ICD-10-CM | POA: Diagnosis present

## 2019-08-27 DIAGNOSIS — M25511 Pain in right shoulder: Secondary | ICD-10-CM | POA: Insufficient documentation

## 2019-08-27 DIAGNOSIS — M25611 Stiffness of right shoulder, not elsewhere classified: Secondary | ICD-10-CM | POA: Insufficient documentation

## 2019-08-27 NOTE — Therapy (Signed)
[place] [address], [place], [address] Phone: [phone]   Fax:  [phone]  Physical Therapy Treatment  Patient Details  Name: Stacey Kelly MRN: [id number] Date of Birth: 1956-03-11 Referring Provider (PT): RA Theda Sers   Encounter Date: 08/27/2019    Past Medical History:  Diagnosis Date  . Depression   . Heart murmur    ASYMPTOMATIC--  ECHO  1995  MILD REGURG  . Migraine   . OA (osteoarthritis)    shoulders  . PONV (postoperative nausea and vomiting)   . Rotator cuff tear, right   . Vitamin D deficiency   . Wears glasses     Past Surgical History:  Procedure Laterality Date  . BUNIONECTOMY Right 2004   BOTH SIDES OF FOOT  . CRYOTHERAPY    . HARDWARE REMOVAL Right 04/15/2014   Procedure: EXCISION OF RIGHT FOOT DORSAL HARDWARE;  Surgeon: Wylene Simmer, MD;  Location: [place];  Service: Orthopedics;  Laterality: Right;  . RIGHT SHOULDER SURGERY  X3  LAST ONE 2010   INCLUDING ROTATOR CUFF REPAIR /  RECONSTRUCTION  . SHOULDER ARTHROSCOPY WITH ROTATOR CUFF REPAIR Left 03/18/2014   Procedure: LEFT SHOULDER ARTHROSCOPY WITH EVALUATION UNDER ANESTHESIA DEBRIDEMENT SUBACROMIAL DECOMPRESSION DISTAL CLAVICAL RESECTION ROTATOR CUFF REPAIR BICEPS  TENDINOSIS;  Surgeon: Sydnee Cabal, MD;  Location: [place];  Service: Orthopedics;  Laterality: Left;  . SHOULDER ARTHROSCOPY WITH ROTATOR CUFF REPAIR Right 06/23/2019   Procedure: SHOULDER ARTHROSCOPY WITH ROTATOR CUFF REPAIR , biceps tenotomy;  Surgeon: Sydnee Cabal, MD;  Location: [facility];  Service: Orthopedics;  Laterality: Right;  . TONSILLECTOMY  AGE 65    There were no vitals filed for this visit.  Subjective Assessment - 08/27/19 0805    Subjective  Pt reports some soreness after last treatment session. Went to the gym and did some light pull downs.    Limitations  Lifting;House hold activities     Currently in Pain?  No/denies    Pain Score  0-No pain    Pain Location  Shoulder    Pain Orientation  Left    Pain Descriptors / Indicators  Tightness                       [facility] Adult PT Treatment/Exercise - 08/27/19 0001      Shoulder Exercises: Seated   Other Seated Exercises  bent over rows & ext 2lb, Rev flys No weight 2x10       Shoulder Exercises: Standing   External Rotation  Strengthening;Both;20 reps;Theraband    Theraband Level (Shoulder External Rotation)  Level 1 (Yellow)    Internal Rotation  Right;20 reps;Theraband    Theraband Level (Shoulder Internal Rotation)  Level 1 (Yellow)    Flexion  Both;AROM;20 reps    Other Standing Exercises  Facing wall with cane overhead shoulder ext 2x10       Shoulder Exercises: ROM/Strengthening   UBE (Upper Arm Bike)  L3 x3 min  each direction    Lat Pull  1.5 plate;20 reps    Cybex Row  1.5 plate;20 reps    Other ROM/Strengthening Exercises  Triceps ext  15lb 2x10       Vasopneumatic   Number Minutes Vasopneumatic   10 minutes    Vasopnuematic Location   Shoulder    Vasopneumatic Pressure  Medium    Vasopneumatic Temperature   34  PT Short Term Goals - 07/03/19 1024      PT SHORT TERM GOAL #1   Title  independent with initial HEP    Status  Achieved        PT Long Term Goals - 08/17/19 1033      PT LONG TERM GOAL #2   Title  AROM of the right shoulder to WFL's    Status  Partially Met      PT LONG TERM GOAL #3   Title  report no difficulty with dressing and doing hair    Status  Partially Met            Plan - 08/27/19 0841    Clinical Impression Statement  Some soreness reported after last session. Good carryover form last session with the PRE's. Some difficulty with over head shoulder extensions. Tactile postural cues needed with seated rows to retract scapulas.    Stability/Clinical Decision Making  Evolving/Moderate complexity    Rehab Potential  Good    PT  Frequency  3x / week    PT Duration  8 weeks    PT Next Visit Plan  slowly continue to advance as tolerated       Patient will benefit from skilled therapeutic intervention in order to improve the following deficits and impairments:  Pain, Postural dysfunction, Increased muscle spasms, Decreased scar mobility, Decreased activity tolerance, Decreased range of motion, Decreased strength, Impaired UE functional use, Impaired flexibility, Increased edema  Visit Diagnosis: Acute pain of right shoulder  Stiffness of right shoulder, not elsewhere classified  Localized edema     Problem List Patient Active Problem List   Diagnosis Date Noted  . S/P rotator cuff repair 03/18/2014    Scot Jun, PTA 08/27/2019, 8:44 AM  [place] [address] [place] [address] [place], [place], [address] Phone: [phone]   Fax:  [phone]  Name: Stacey Kelly MRN: [id number] Date of Birth: 1956/02/03

## 2019-08-31 ENCOUNTER — Ambulatory Visit: Payer: PRIVATE HEALTH INSURANCE | Attending: Specialist | Admitting: Physical Therapy

## 2019-08-31 ENCOUNTER — Encounter: Payer: Self-pay | Admitting: Physical Therapy

## 2019-08-31 ENCOUNTER — Other Ambulatory Visit: Payer: Self-pay

## 2019-08-31 DIAGNOSIS — R600 Localized edema: Secondary | ICD-10-CM | POA: Insufficient documentation

## 2019-08-31 DIAGNOSIS — M25511 Pain in right shoulder: Secondary | ICD-10-CM | POA: Insufficient documentation

## 2019-08-31 DIAGNOSIS — M25611 Stiffness of right shoulder, not elsewhere classified: Secondary | ICD-10-CM | POA: Insufficient documentation

## 2019-08-31 NOTE — Therapy (Signed)
[place], [place], [address] Phone: [phone]   Fax:  [phone]  Physical Therapy Treatment  Patient Details  Name: Stacey Kelly MRN: [id number] Date of Birth: 02/16/1956 Referring Provider (PT): RA Theda Sers   Encounter Date: 08/31/2019  PT End of Session - 08/31/19 0851    Visit Number  18    Authorization Type  W/C    PT Start Time  0801    PT Stop Time  0856    PT Time Calculation (min)  55 min    Activity Tolerance  Patient tolerated treatment well    Behavior During Therapy  [facility] for tasks assessed/performed       Past Medical History:  Diagnosis Date  . Depression   . Heart murmur    ASYMPTOMATIC--  ECHO  1995  MILD REGURG  . Migraine   . OA (osteoarthritis)    shoulders  . PONV (postoperative nausea and vomiting)   . Rotator cuff tear, right   . Vitamin D deficiency   . Wears glasses     Past Surgical History:  Procedure Laterality Date  . BUNIONECTOMY Right 2004   BOTH SIDES OF FOOT  . CRYOTHERAPY    . HARDWARE REMOVAL Right 04/15/2014   Procedure: EXCISION OF RIGHT FOOT DORSAL HARDWARE;  Surgeon: Wylene Simmer, MD;  Location: [place];  Service: Orthopedics;  Laterality: Right;  . RIGHT SHOULDER SURGERY  X3  LAST ONE 2010   INCLUDING ROTATOR CUFF REPAIR /  RECONSTRUCTION  . SHOULDER ARTHROSCOPY WITH ROTATOR CUFF REPAIR Left 03/18/2014   Procedure: LEFT SHOULDER ARTHROSCOPY WITH EVALUATION UNDER ANESTHESIA DEBRIDEMENT SUBACROMIAL DECOMPRESSION DISTAL CLAVICAL RESECTION ROTATOR CUFF REPAIR BICEPS  TENDINOSIS;  Surgeon: Sydnee Cabal, MD;  Location: [place];  Service: Orthopedics;  Laterality: Left;  . SHOULDER ARTHROSCOPY WITH ROTATOR CUFF REPAIR Right 06/23/2019   Procedure: SHOULDER ARTHROSCOPY WITH ROTATOR CUFF REPAIR , biceps tenotomy;  Surgeon: Sydnee Cabal, MD;  Location: [facility];  Service: Orthopedics;   Laterality: Right;  . TONSILLECTOMY  AGE 63    There were no vitals filed for this visit.  Subjective Assessment - 08/31/19 0805    Subjective  "Good, just stiff all the time"    Limitations  Lifting;House hold activities    Currently in Pain?  No/denies                       [facility] Adult PT Treatment/Exercise - 08/31/19 0001      Shoulder Exercises: Standing   Internal Rotation  Right;20 reps;Theraband    Theraband Level (Shoulder Internal Rotation)  Level 1 (Yellow)    Extension  Strengthening;Both;20 reps;Theraband    Theraband Level (Shoulder Extension)  Level 3 (Green)    Row  Strengthening;Both;20 reps;Theraband   With ER   Theraband Level (Shoulder Row)  Level 2 (Red)    Other Standing Exercises  Facing wall with cane overhead shoulder ext 2x10       Shoulder Exercises: ROM/Strengthening   UBE (Upper Arm Bike)  L3 x3 min  each direction    Lat Pull  1.5 plate;20 reps    Cybex Row  1.5 plate;20 reps    Wall Wash  flexion, CCW/CW at head high,    Other ROM/Strengthening Exercises  Triceps ext  15lb 2x10       Vasopneumatic   Number Minutes Vasopneumatic   10 minutes    Vasopnuematic  Location   Shoulder    Vasopneumatic Pressure  Medium    Vasopneumatic Temperature   34      Manual Therapy   Manual Therapy  Passive ROM    Passive ROM  all PROM to end ranges                PT Short Term Goals - 07/03/19 1024      PT SHORT TERM GOAL #1   Title  independent with initial HEP    Status  Achieved        PT Long Term Goals - 08/17/19 1033      PT LONG TERM GOAL #2   Title  AROM of the right shoulder to WFL's    Status  Partially Met      PT LONG TERM GOAL #3   Title  report no difficulty with dressing and doing hair    Status  Partially Met            Plan - 08/31/19 0852    Clinical Impression Statement  Overall to did really well with today's interventions. Most difficulty with CC/CCW circles with arm hand at shoulder height.  Postural compensation noted with R shoulder external rotation activities.    Stability/Clinical Decision Making  Evolving/Moderate complexity    Rehab Potential  Good    PT Frequency  3x / week    PT Duration  8 weeks    PT Treatment/Interventions  ADLs/Self Care Home Management;Cryotherapy;Electrical Stimulation;Therapeutic activities;Therapeutic exercise;Patient/family education;Manual techniques;Vasopneumatic Device    PT Next Visit Plan  slowly continue to advance as tolerated       Patient will benefit from skilled therapeutic intervention in order to improve the following deficits and impairments:  Pain, Postural dysfunction, Increased muscle spasms, Decreased scar mobility, Decreased activity tolerance, Decreased range of motion, Decreased strength, Impaired UE functional use, Impaired flexibility, Increased edema  Visit Diagnosis: Localized edema  Stiffness of right shoulder, not elsewhere classified  Acute pain of right shoulder     Problem List Patient Active Problem List   Diagnosis Date Noted  . S/P rotator cuff repair 03/18/2014    Scot Jun, PTA 08/31/2019, 8:55 AM  [address] [place], [place], [address] Phone: [phone]   Fax:  [phone]  Name: Stacey Kelly MRN: [id number] Date of Birth: 1955/11/02

## 2019-09-03 ENCOUNTER — Other Ambulatory Visit: Payer: Self-pay

## 2019-09-03 ENCOUNTER — Ambulatory Visit: Payer: PRIVATE HEALTH INSURANCE | Attending: Specialist | Admitting: Physical Therapy

## 2019-09-03 ENCOUNTER — Encounter: Payer: Self-pay | Admitting: Physical Therapy

## 2019-09-03 DIAGNOSIS — R600 Localized edema: Secondary | ICD-10-CM | POA: Diagnosis present

## 2019-09-03 DIAGNOSIS — M25511 Pain in right shoulder: Secondary | ICD-10-CM | POA: Diagnosis present

## 2019-09-03 DIAGNOSIS — M25611 Stiffness of right shoulder, not elsewhere classified: Secondary | ICD-10-CM | POA: Insufficient documentation

## 2019-09-03 NOTE — Therapy (Signed)
[place], [place], [address] Phone: [phone]   Fax:  [phone]  Physical Therapy Treatment  Patient Details  Name: Stacey Kelly MRN: [id number] Date of Birth: [birth year] Referring Provider (PT): RA Theda Sers   Encounter Date: 09/03/2019  PT End of Session - 09/03/19 0930    Visit Number  19    Authorization Type  W/C    PT Start Time  0847    PT Stop Time  0930    PT Time Calculation (min)  43 min    Activity Tolerance  Patient tolerated treatment well    Behavior During Therapy  [facility] for tasks assessed/performed       Past Medical History:  Diagnosis Date  . Depression   . Heart murmur    ASYMPTOMATIC--  ECHO  1995  MILD REGURG  . Migraine   . OA (osteoarthritis)    shoulders  . PONV (postoperative nausea and vomiting)   . Rotator cuff tear, right   . Vitamin D deficiency   . Wears glasses     Past Surgical History:  Procedure Laterality Date  . BUNIONECTOMY Right 2004   BOTH SIDES OF FOOT  . CRYOTHERAPY    . HARDWARE REMOVAL Right 04/15/2014   Procedure: EXCISION OF RIGHT FOOT DORSAL HARDWARE;  Surgeon: Wylene Simmer, MD;  Location: [place];  Service: Orthopedics;  Laterality: Right;  . RIGHT SHOULDER SURGERY  X3  LAST ONE 2010   INCLUDING ROTATOR CUFF REPAIR /  RECONSTRUCTION  . SHOULDER ARTHROSCOPY WITH ROTATOR CUFF REPAIR Left 03/18/2014   Procedure: LEFT SHOULDER ARTHROSCOPY WITH EVALUATION UNDER ANESTHESIA DEBRIDEMENT SUBACROMIAL DECOMPRESSION DISTAL CLAVICAL RESECTION ROTATOR CUFF REPAIR BICEPS  TENDINOSIS;  Surgeon: Sydnee Cabal, MD;  Location: [place];  Service: Orthopedics;  Laterality: Left;  . SHOULDER ARTHROSCOPY WITH ROTATOR CUFF REPAIR Right 06/23/2019   Procedure: SHOULDER ARTHROSCOPY WITH ROTATOR CUFF REPAIR , biceps tenotomy;  Surgeon: Sydnee Cabal, MD;  Location: [facility];  Service: Orthopedics;   Laterality: Right;  . TONSILLECTOMY  AGE 63    There were no vitals filed for this visit.  Subjective Assessment - 09/03/19 0852    Subjective  "Good, no pain just stiff"    Currently in Pain?  No/denies         [facility] PT Assessment - 09/03/19 0001      AROM   Right Shoulder Flexion  180 Degrees    Right Shoulder ABduction  180 Degrees    Right Shoulder Internal Rotation  56 Degrees    Right Shoulder External Rotation  67 Degrees                   [facility] Adult PT Treatment/Exercise - 09/03/19 0001      Shoulder Exercises: Supine   External Rotation  Right;10 reps    Internal Rotation  Right;AROM;10 reps      Shoulder Exercises: Standing   External Rotation  Strengthening;Both;20 reps;Theraband    Theraband Level (Shoulder External Rotation)  Level 1 (Yellow)    Internal Rotation  Right;20 reps;Theraband    Theraband Level (Shoulder Internal Rotation)  Level 1 (Yellow)    Extension  Strengthening;Both;20 reps;Theraband    Theraband Level (Shoulder Extension)  Level 3 (Green)    Row  Strengthening;Both;20 reps;Theraband    Theraband Level (Shoulder Row)  Level 3 (Green)    Other Standing Exercises  facing yall I's 2x10  Other Standing Exercises  2 level cabinet reaches flex x 5 some assist needed for 2nd level, one level rcabinet reach RUE x5; Shrugs 5lb 2x10       Shoulder Exercises: ROM/Strengthening   UBE (Upper Arm Bike)  L4 x3 min  each direction               PT Short Term Goals - 07/03/19 1024      PT SHORT TERM GOAL #1   Title  independent with initial HEP    Status  Achieved        PT Long Term Goals - 08/17/19 1033      PT LONG TERM GOAL #2   Title  AROM of the right shoulder to WFL's    Status  Partially Met      PT LONG TERM GOAL #3   Title  report no difficulty with dressing and doing hair    Status  Partially Met            Plan - 09/03/19 0930    Clinical Impression Statement  Pt has progressed increasing her R  shoulder AROM. All AROM taken in the supine position. no reports of increase pain. Assist needed with standing 2 level cabinet reaches. Some shoulder protraction with R shoulder internal rotation. Tactile cues given with rows and extensions for posture.    Stability/Clinical Decision Making  Evolving/Moderate complexity    Rehab Potential  Good    PT Frequency  3x / week    PT Duration  8 weeks    PT Treatment/Interventions  ADLs/Self Care Home Management;Cryotherapy;Electrical Stimulation;Therapeutic activities;Therapeutic exercise;Patient/family education;Manual techniques;Vasopneumatic Device    PT Next Visit Plan  slowly continue to advance as tolerated       Patient will benefit from skilled therapeutic intervention in order to improve the following deficits and impairments:  Pain, Postural dysfunction, Increased muscle spasms, Decreased scar mobility, Decreased activity tolerance, Decreased range of motion, Decreased strength, Impaired UE functional use, Impaired flexibility, Increased edema  Visit Diagnosis: Stiffness of right shoulder, not elsewhere classified  Acute pain of right shoulder  Localized edema     Problem List Patient Active Problem List   Diagnosis Date Noted  . S/P rotator cuff repair 03/18/2014    Scot Jun, PTA 09/03/2019, 9:35 AM  [address] [place], [place], [address] Phone: [phone]   Fax:  [phone]  Name: Stacey Kelly MRN: [id number] Date of Birth: 1956-05-26

## 2019-09-07 ENCOUNTER — Other Ambulatory Visit: Payer: Self-pay

## 2019-09-07 ENCOUNTER — Ambulatory Visit: Payer: PRIVATE HEALTH INSURANCE | Attending: Specialist | Admitting: Physical Therapy

## 2019-09-07 ENCOUNTER — Encounter: Payer: Self-pay | Admitting: Physical Therapy

## 2019-09-07 DIAGNOSIS — R600 Localized edema: Secondary | ICD-10-CM | POA: Insufficient documentation

## 2019-09-07 DIAGNOSIS — M25611 Stiffness of right shoulder, not elsewhere classified: Secondary | ICD-10-CM | POA: Insufficient documentation

## 2019-09-07 DIAGNOSIS — M25511 Pain in right shoulder: Secondary | ICD-10-CM | POA: Insufficient documentation

## 2019-09-07 NOTE — Therapy (Signed)
[name], [place], [address] Phone: [phone]   Fax:  [phone]  Physical Therapy Treatment  Patient Details  Name: Stacey Kelly MRN: [id number] Date of Birth: 01/24/1956 Referring Provider (PT): RA Theda Sers   Encounter Date: 09/07/2019  PT End of Session - 09/07/19 0852    Visit Number  20    Authorization Type  W/C    PT Start Time  0806    PT Stop Time  0859    PT Time Calculation (min)  53 min    Activity Tolerance  Patient tolerated treatment well    Behavior During Therapy  [facility] for tasks assessed/performed       Past Medical History:  Diagnosis Date  . Depression   . Heart murmur    ASYMPTOMATIC--  ECHO  1995  MILD REGURG  . Migraine   . OA (osteoarthritis)    shoulders  . PONV (postoperative nausea and vomiting)   . Rotator cuff tear, right   . Vitamin D deficiency   . Wears glasses     Past Surgical History:  Procedure Laterality Date  . BUNIONECTOMY Right 2004   BOTH SIDES OF FOOT  . CRYOTHERAPY    . HARDWARE REMOVAL Right 04/15/2014   Procedure: EXCISION OF RIGHT FOOT DORSAL HARDWARE;  Surgeon: Wylene Simmer, MD;  Location: [place];  Service: Orthopedics;  Laterality: Right;  . RIGHT SHOULDER SURGERY  X3  LAST ONE 2010   INCLUDING ROTATOR CUFF REPAIR /  RECONSTRUCTION  . SHOULDER ARTHROSCOPY WITH ROTATOR CUFF REPAIR Left 03/18/2014   Procedure: LEFT SHOULDER ARTHROSCOPY WITH EVALUATION UNDER ANESTHESIA DEBRIDEMENT SUBACROMIAL DECOMPRESSION DISTAL CLAVICAL RESECTION ROTATOR CUFF REPAIR BICEPS  TENDINOSIS;  Surgeon: Sydnee Cabal, MD;  Location: [place];  Service: Orthopedics;  Laterality: Left;  . SHOULDER ARTHROSCOPY WITH ROTATOR CUFF REPAIR Right 06/23/2019   Procedure: SHOULDER ARTHROSCOPY WITH ROTATOR CUFF REPAIR , biceps tenotomy;  Surgeon: Sydnee Cabal, MD;  Location: [facility];  Service: Orthopedics;   Laterality: Right;  . TONSILLECTOMY  AGE 25    There were no vitals filed for this visit.  Subjective Assessment - 09/07/19 0808    Subjective  "I am good just stiff, always stiff"    Currently in Pain?  No/denies                       [facility] Adult PT Treatment/Exercise - 09/07/19 0001      Shoulder Exercises: Standing   Flexion  AAROM;20 reps   cane    Row  Strengthening;Both;Theraband;15 reps   x2   Theraband Level (Shoulder Row)  Level 2 (Red)    Other Standing Exercises  2 level cabinet reaches flex x 5 some assist needed for 2nd level, one level cabinet reach RUE x5; Shrugs 5lb 2x10       Shoulder Exercises: ROM/Strengthening   UBE (Upper Arm Bike)  L4 x3 min  each direction    Lat Pull  1.5 plate;20 reps    Cybex Row  1.5 plate;20 reps      Vasopneumatic   Number Minutes Vasopneumatic   10 minutes    Vasopnuematic Location   Shoulder    Vasopneumatic Pressure  Medium    Vasopneumatic Temperature   34      Manual Therapy   Manual Therapy  Passive ROM    Manual therapy comments  some shoulder clicking with passiver ER  Passive ROM  all PROM to end ranges                PT Short Term Goals - 07/03/19 1024      PT SHORT TERM GOAL #1   Title  independent with initial HEP    Status  Achieved        PT Long Term Goals - 08/17/19 1033      PT LONG TERM GOAL #2   Title  AROM of the right shoulder to WFL's    Status  Partially Met      PT LONG TERM GOAL #3   Title  report no difficulty with dressing and doing hair    Status  Partially Met            Plan - 09/07/19 0852    Clinical Impression Statement  Pt ~ 6 minutes late for today's treatment session. Tactile cues needed to keep R shoulder down with bilateral shoulder flexion. Postural que's needed with standing shoulder rows. Full PROM with flexion, abduction, and scaption. Some R shoulder passive limitation with external and internal rotation.    Stability/Clinical Decision  Making  Evolving/Moderate complexity    PT Treatment/Interventions  ADLs/Self Care Home Management;Cryotherapy;Electrical Stimulation;Therapeutic activities;Therapeutic exercise;Patient/family education;Manual techniques;Vasopneumatic Device    PT Next Visit Plan  slowly continue to advance as tolerated       Patient will benefit from skilled therapeutic intervention in order to improve the following deficits and impairments:  Pain, Postural dysfunction, Increased muscle spasms, Decreased scar mobility, Decreased activity tolerance, Decreased range of motion, Decreased strength, Impaired UE functional use, Impaired flexibility, Increased edema  Visit Diagnosis: Stiffness of right shoulder, not elsewhere classified  Acute pain of right shoulder  Localized edema     Problem List Patient Active Problem List   Diagnosis Date Noted  . S/P rotator cuff repair 03/18/2014    Scot Jun, PTA 09/07/2019, 8:56 AM  [address] [place], [place], [address] Phone: [phone]   Fax:  [phone]  Name: Stacey Kelly MRN: [id number] Date of Birth: [birth year]

## 2019-09-10 ENCOUNTER — Encounter: Payer: Self-pay | Admitting: Physical Therapy

## 2019-09-10 ENCOUNTER — Other Ambulatory Visit: Payer: Self-pay

## 2019-09-10 ENCOUNTER — Ambulatory Visit: Payer: PRIVATE HEALTH INSURANCE | Attending: Specialist | Admitting: Physical Therapy

## 2019-09-10 DIAGNOSIS — R600 Localized edema: Secondary | ICD-10-CM | POA: Insufficient documentation

## 2019-09-10 DIAGNOSIS — M25511 Pain in right shoulder: Secondary | ICD-10-CM | POA: Insufficient documentation

## 2019-09-10 DIAGNOSIS — M25611 Stiffness of right shoulder, not elsewhere classified: Secondary | ICD-10-CM | POA: Diagnosis present

## 2019-09-10 NOTE — Therapy (Signed)
[place], [place], [address] Phone: [phone]   Fax:  [phone]  Physical Therapy Treatment  Patient Details  Name: Stacey Kelly MRN: [id number] Date of Birth: 09-07-1956 Referring Provider (PT): RA Theda Sers   Encounter Date: 09/10/2019  PT End of Session - 09/10/19 0931    Visit Number  21    Authorization Type  W/C    PT Start Time  0845    PT Stop Time  0931    PT Time Calculation (min)  46 min    Activity Tolerance  Patient tolerated treatment well    Behavior During Therapy  [facility] for tasks assessed/performed       Past Medical History:  Diagnosis Date  . Depression   . Heart murmur    ASYMPTOMATIC--  ECHO  1995  MILD REGURG  . Migraine   . OA (osteoarthritis)    shoulders  . PONV (postoperative nausea and vomiting)   . Rotator cuff tear, right   . Vitamin D deficiency   . Wears glasses     Past Surgical History:  Procedure Laterality Date  . BUNIONECTOMY Right 2004   BOTH SIDES OF FOOT  . CRYOTHERAPY    . HARDWARE REMOVAL Right 04/15/2014   Procedure: EXCISION OF RIGHT FOOT DORSAL HARDWARE;  Surgeon: Wylene Simmer, MD;  Location: [place];  Service: Orthopedics;  Laterality: Right;  . RIGHT SHOULDER SURGERY  X3  LAST ONE 2010   INCLUDING ROTATOR CUFF REPAIR /  RECONSTRUCTION  . SHOULDER ARTHROSCOPY WITH ROTATOR CUFF REPAIR Left 03/18/2014   Procedure: LEFT SHOULDER ARTHROSCOPY WITH EVALUATION UNDER ANESTHESIA DEBRIDEMENT SUBACROMIAL DECOMPRESSION DISTAL CLAVICAL RESECTION ROTATOR CUFF REPAIR BICEPS  TENDINOSIS;  Surgeon: Sydnee Cabal, MD;  Location: [place];  Service: Orthopedics;  Laterality: Left;  . SHOULDER ARTHROSCOPY WITH ROTATOR CUFF REPAIR Right 06/23/2019   Procedure: SHOULDER ARTHROSCOPY WITH ROTATOR CUFF REPAIR , biceps tenotomy;  Surgeon: Sydnee Cabal, MD;  Location: [facility];  Service: Orthopedics;   Laterality: Right;  . TONSILLECTOMY  AGE 75    There were no vitals filed for this visit.  Subjective Assessment - 09/10/19 0850    Subjective  "Stiff, Maybe a one today, I may have rolled on it    Currently in Pain?  Yes    Pain Score  1     Pain Location  Shoulder    Pain Orientation  Right                       [facility] Adult PT Treatment/Exercise - 09/10/19 0001      Shoulder Exercises: Sidelying   External Rotation  Right;20 reps;Weights    External Rotation Weight (lbs)  1    ABduction  Strengthening;Right;20 reps    Other Sidelying Exercises  ER with jab 2x10      Shoulder Exercises: Standing   External Rotation  Strengthening;Both;20 reps;Theraband    Theraband Level (Shoulder External Rotation)  Level 1 (Yellow)    Internal Rotation  Right;20 reps;Theraband    Theraband Level (Shoulder Internal Rotation)  Level 1 (Yellow)    Flexion  AROM;Right;20 reps   up wall with pillow case      Shoulder Exercises: ROM/Strengthening   Nustep  L3 x 4 min, L1 UE only x2      Lat Pull  1.5 plate;20 reps    Cybex Row  1.5 plate;20 reps  Manual Therapy   Manual Therapy  Passive ROM    Passive ROM  RUE ER/IR               PT Short Term Goals - 07/03/19 1024      PT SHORT TERM GOAL #1   Title  independent with initial HEP    Status  Achieved        PT Long Term Goals - 08/17/19 1033      PT LONG TERM GOAL #2   Title  AROM of the right shoulder to WFL's    Status  Partially Met      PT LONG TERM GOAL #3   Title  report no difficulty with dressing and doing hair    Status  Partially Met            Plan - 09/10/19 0931    Clinical Impression Statement  Pt did well overall with today's activities. attempted Abduction up wall with pillow case but discontinues due to pain and decrease ROM. Pt did well with side lying ER, some R shoulder popping with jab motion. Cues to keep R shoulder retracted with jab.    Stability/Clinical Decision  Making  Evolving/Moderate complexity    Rehab Potential  Good    PT Frequency  3x / week    PT Duration  8 weeks    PT Treatment/Interventions  ADLs/Self Care Home Management;Cryotherapy;Electrical Stimulation;Therapeutic activities;Therapeutic exercise;Patient/family education;Manual techniques;Vasopneumatic Device    PT Next Visit Plan  slowly continue to advance as tolerated       Patient will benefit from skilled therapeutic intervention in order to improve the following deficits and impairments:  Pain, Postural dysfunction, Increased muscle spasms, Decreased scar mobility, Decreased activity tolerance, Decreased range of motion, Decreased strength, Impaired UE functional use, Impaired flexibility, Increased edema  Visit Diagnosis: Acute pain of right shoulder  Stiffness of right shoulder, not elsewhere classified  Localized edema     Problem List Patient Active Problem List   Diagnosis Date Noted  . S/P rotator cuff repair 03/18/2014    Scot Jun, PTA 09/10/2019, 9:39 AM  [place], [place], [address] Phone: [phone]   Fax:  [phone]  Name: FLOYCE BUJAK MRN: [id number] Date of Birth: 1956/10/17

## 2019-09-14 ENCOUNTER — Encounter: Payer: Self-pay | Admitting: Physical Therapy

## 2019-09-14 ENCOUNTER — Other Ambulatory Visit: Payer: Self-pay

## 2019-09-14 ENCOUNTER — Ambulatory Visit: Payer: PRIVATE HEALTH INSURANCE | Attending: Specialist | Admitting: Physical Therapy

## 2019-09-14 DIAGNOSIS — M25611 Stiffness of right shoulder, not elsewhere classified: Secondary | ICD-10-CM | POA: Insufficient documentation

## 2019-09-14 DIAGNOSIS — R600 Localized edema: Secondary | ICD-10-CM | POA: Diagnosis present

## 2019-09-14 DIAGNOSIS — M25511 Pain in right shoulder: Secondary | ICD-10-CM | POA: Diagnosis present

## 2019-09-14 NOTE — Therapy (Signed)
[place], [place], [address] Phone: [phone]   Fax:  [phone]  Physical Therapy Treatment  Patient Details  Name: Stacey Kelly MRN: [id number] Date of Birth: [birth year] Referring Provider (PT): RA Theda Sers   Encounter Date: 09/14/2019  PT End of Session - 09/14/19 0850    Visit Number  22    Number of Visits  15    Authorization Type  W/C    PT Start Time  0804    PT Stop Time  0845    PT Time Calculation (min)  41 min    Activity Tolerance  Patient tolerated treatment well    Behavior During Therapy  [facility] for tasks assessed/performed       Past Medical History:  Diagnosis Date  . Depression   . Heart murmur    ASYMPTOMATIC--  ECHO  1995  MILD REGURG  . Migraine   . OA (osteoarthritis)    shoulders  . PONV (postoperative nausea and vomiting)   . Rotator cuff tear, right   . Vitamin D deficiency   . Wears glasses     Past Surgical History:  Procedure Laterality Date  . BUNIONECTOMY Right 2004   BOTH SIDES OF FOOT  . CRYOTHERAPY    . HARDWARE REMOVAL Right 04/15/2014   Procedure: EXCISION OF RIGHT FOOT DORSAL HARDWARE;  Surgeon: Wylene Simmer, MD;  Location: [place];  Service: Orthopedics;  Laterality: Right;  . RIGHT SHOULDER SURGERY  X3  LAST ONE 2010   INCLUDING ROTATOR CUFF REPAIR /  RECONSTRUCTION  . SHOULDER ARTHROSCOPY WITH ROTATOR CUFF REPAIR Left 03/18/2014   Procedure: LEFT SHOULDER ARTHROSCOPY WITH EVALUATION UNDER ANESTHESIA DEBRIDEMENT SUBACROMIAL DECOMPRESSION DISTAL CLAVICAL RESECTION ROTATOR CUFF REPAIR BICEPS  TENDINOSIS;  Surgeon: Sydnee Cabal, MD;  Location: [place];  Service: Orthopedics;  Laterality: Left;  . SHOULDER ARTHROSCOPY WITH ROTATOR CUFF REPAIR Right 06/23/2019   Procedure: SHOULDER ARTHROSCOPY WITH ROTATOR CUFF REPAIR , biceps tenotomy;  Surgeon: Sydnee Cabal, MD;  Location: [facility];   Service: Orthopedics;  Laterality: Right;  . TONSILLECTOMY  AGE 38    There were no vitals filed for this visit.  Subjective Assessment - 09/14/19 0807    Subjective  "Great, just stiff every morning"    Currently in Pain?  No/denies         [facility] PT Assessment - 09/14/19 0001      Posture/Postural Control   Posture Comments  fwd head, rounded shoulders                   [facility] Adult PT Treatment/Exercise - 09/14/19 0001      Shoulder Exercises: Supine   External Rotation  Right;10 reps    Internal Rotation  Right;AROM;10 reps      Shoulder Exercises: Prone   Other Prone Exercises  I's & Y's x 10 eacj      Shoulder Exercises: Sidelying   External Rotation  Right;20 reps;Weights    External Rotation Weight (lbs)  1    Other Sidelying Exercises  ER with jab 2x10      Shoulder Exercises: Standing   External Rotation  Strengthening;Both;20 reps;Theraband    Theraband Level (Shoulder External Rotation)  Level 1 (Yellow)    Internal Rotation  Right;20 reps;Theraband    Theraband Level (Shoulder Internal Rotation)  Level 1 (Yellow)    Flexion  AROM;Right;20 reps   cane, back against wall  Shoulder Exercises: ROM/Strengthening   UBE (Upper Arm Bike)  L4 x3 min  each direction    Lat Pull  1.5 plate;20 reps    Cybex Row  1.5 plate;20 reps      Manual Therapy   Manual Therapy  Passive ROM    Manual therapy comments  some shoulder clicking with passive ER/IR    Passive ROM  RUE ER/IR               PT Short Term Goals - 07/03/19 1024      PT SHORT TERM GOAL #1   Title  independent with initial HEP    Status  Achieved        PT Long Term Goals - 08/17/19 1033      PT LONG TERM GOAL #2   Title  AROM of the right shoulder to WFL's    Status  Partially Met      PT LONG TERM GOAL #3   Title  report no difficulty with dressing and doing hair    Status  Partially Met            Plan - 09/14/19 0851    Clinical Impression Statement   Overall pt did well with today's interventions. Attempted some I's and Y's om the prone position. Good scapular muscle contraction but little arm elevation. Side lying ER ROM has improved under resistance. Cues needed to keep scapula retracted with side lying  ER and jab.    Stability/Clinical Decision Making  Evolving/Moderate complexity    Rehab Potential  Good    PT Frequency  3x / week    PT Duration  8 weeks    PT Treatment/Interventions  ADLs/Self Care Home Management;Cryotherapy;Electrical Stimulation;Therapeutic activities;Therapeutic exercise;Patient/family education;Manual techniques;Vasopneumatic Device    PT Next Visit Plan  slowly continue to advance as tolerated       Patient will benefit from skilled therapeutic intervention in order to improve the following deficits and impairments:  Pain, Postural dysfunction, Increased muscle spasms, Decreased scar mobility, Decreased activity tolerance, Decreased range of motion, Decreased strength, Impaired UE functional use, Impaired flexibility, Increased edema  Visit Diagnosis: Localized edema  Stiffness of right shoulder, not elsewhere classified  Acute pain of right shoulder     Problem List Patient Active Problem List   Diagnosis Date Noted  . S/P rotator cuff repair 03/18/2014    Scot Jun, PTA 09/14/2019, 8:58 AM  [address] [place], [place], [address] Phone: [phone]   Fax:  [phone]  Name: Stacey Kelly MRN: [id number] Date of Birth: [birth year]

## 2019-09-17 ENCOUNTER — Ambulatory Visit: Payer: PRIVATE HEALTH INSURANCE | Attending: Specialist | Admitting: Physical Therapy

## 2019-09-17 ENCOUNTER — Other Ambulatory Visit: Payer: Self-pay

## 2019-09-17 ENCOUNTER — Encounter: Payer: Self-pay | Admitting: Physical Therapy

## 2019-09-17 DIAGNOSIS — M25611 Stiffness of right shoulder, not elsewhere classified: Secondary | ICD-10-CM | POA: Insufficient documentation

## 2019-09-17 DIAGNOSIS — M25511 Pain in right shoulder: Secondary | ICD-10-CM | POA: Diagnosis present

## 2019-09-17 DIAGNOSIS — R600 Localized edema: Secondary | ICD-10-CM | POA: Insufficient documentation

## 2019-09-17 NOTE — Therapy (Signed)
[place], [place], [address] Phone: [phone]   Fax:  [phone]  Physical Therapy Treatment  Patient Details  Name: Stacey Kelly MRN: [id number] Date of Birth: 07-02-1956 Referring Provider (PT): RA Theda Sers   Encounter Date: 09/17/2019  PT End of Session - 09/17/19 0928    Visit Number  23    Authorization Type  W/C    PT Start Time  0852    PT Stop Time  0930    PT Time Calculation (min)  38 min    Activity Tolerance  Patient tolerated treatment well    Behavior During Therapy  [facility] for tasks assessed/performed       Past Medical History:  Diagnosis Date  . Depression   . Heart murmur    ASYMPTOMATIC--  ECHO  1995  MILD REGURG  . Migraine   . OA (osteoarthritis)    shoulders  . PONV (postoperative nausea and vomiting)   . Rotator cuff tear, right   . Vitamin D deficiency   . Wears glasses     Past Surgical History:  Procedure Laterality Date  . BUNIONECTOMY Right 2004   BOTH SIDES OF FOOT  . CRYOTHERAPY    . HARDWARE REMOVAL Right 04/15/2014   Procedure: EXCISION OF RIGHT FOOT DORSAL HARDWARE;  Surgeon: Wylene Simmer, MD;  Location: [place];  Service: Orthopedics;  Laterality: Right;  . RIGHT SHOULDER SURGERY  X3  LAST ONE 2010   INCLUDING ROTATOR CUFF REPAIR /  RECONSTRUCTION  . SHOULDER ARTHROSCOPY WITH ROTATOR CUFF REPAIR Left 03/18/2014   Procedure: LEFT SHOULDER ARTHROSCOPY WITH EVALUATION UNDER ANESTHESIA DEBRIDEMENT SUBACROMIAL DECOMPRESSION DISTAL CLAVICAL RESECTION ROTATOR CUFF REPAIR BICEPS  TENDINOSIS;  Surgeon: Sydnee Cabal, MD;  Location: [place];  Service: Orthopedics;  Laterality: Left;  . SHOULDER ARTHROSCOPY WITH ROTATOR CUFF REPAIR Right 06/23/2019   Procedure: SHOULDER ARTHROSCOPY WITH ROTATOR CUFF REPAIR , biceps tenotomy;  Surgeon: Sydnee Cabal, MD;  Location: [facility];  Service: Orthopedics;   Laterality: Right;  . TONSILLECTOMY  AGE 26    There were no vitals filed for this visit.  Subjective Assessment - 09/17/19 0854    Subjective  "I just didn't sleep good last night" It just sore    Currently in Pain?  No/denies    Pain Location  Shoulder    Pain Orientation  Right    Pain Descriptors / Indicators  Sore                       [facility] Adult PT Treatment/Exercise - 09/17/19 0001      Shoulder Exercises: Sidelying   External Rotation  Right;20 reps;Weights    External Rotation Weight (lbs)  1    ABduction  Strengthening;Right;20 reps    ABduction Weight (lbs)  1    Other Sidelying Exercises  ER with jab x10      Shoulder Exercises: Standing   Other Standing Exercises  facing wall overhead ext with cane 2x10     Other Standing Exercises  2 flvel cabinet reaches flex 2x10, 1 level abduction 2x10      Shoulder Exercises: ROM/Strengthening   UBE (Upper Arm Bike)  L4 x3 min  each direction    Lat Pull  1.5 plate;20 reps    Cybex Row  1.5 plate;20 reps      Manual Therapy   Manual Therapy  Passive ROM  Manual therapy comments  some shoulder clicking with passive ER/IR    Passive ROM  RUE ER/IR               PT Short Term Goals - 07/03/19 1024      PT SHORT TERM GOAL #1   Title  independent with initial HEP    Status  Achieved        PT Long Term Goals - 09/17/19 0935      PT LONG TERM GOAL #1   Title  decrease pain 50%    Status  Achieved      PT LONG TERM GOAL #2   Title  AROM of the right shoulder to WFL's    Status  Partially Met      PT LONG TERM GOAL #3   Title  report no difficulty with dressing and doing hair    Status  Partially Met      PT LONG TERM GOAL #4   Title  sleep without pain down the arm    Status  Achieved      PT LONG TERM GOAL #5   Title  lift 5# overhead with right arm    Status  On-going            Plan - 09/17/19 0932    Clinical Impression Statement  Pt ~ 7 minutes late for today's  treatment. Scapular retraction still seem difficult for pt. Went back to overhead Ext with cane instead of I's and T's, she did better with this. Some popping noted with passive ER rotation. Some tightness noted with R shoulder internal rotation, assist needed to keep shoulder from protracting.    Stability/Clinical Decision Making  Evolving/Moderate complexity    Rehab Potential  Good    PT Frequency  3x / week    PT Duration  8 weeks    PT Treatment/Interventions  ADLs/Self Care Home Management;Cryotherapy;Electrical Stimulation;Therapeutic activities;Therapeutic exercise;Patient/family education;Manual techniques;Vasopneumatic Device    PT Next Visit Plan  slowly continue to advance as tolerated       Patient will benefit from skilled therapeutic intervention in order to improve the following deficits and impairments:  Pain, Postural dysfunction, Increased muscle spasms, Decreased scar mobility, Decreased activity tolerance, Decreased range of motion, Decreased strength, Impaired UE functional use, Impaired flexibility, Increased edema  Visit Diagnosis: Stiffness of right shoulder, not elsewhere classified  Acute pain of right shoulder  Localized edema     Problem List Patient Active Problem List   Diagnosis Date Noted  . S/P rotator cuff repair 03/18/2014    Scot Jun, PTA 09/17/2019, 9:35 AM  [place] [address] [place] [address] [place], [place], [address] Phone: [phone]   Fax:  [phone]  Name: Stacey Kelly MRN: [id number] Date of Birth: April 08, 1956

## 2019-09-21 ENCOUNTER — Encounter: Payer: Self-pay | Admitting: Physical Therapy

## 2019-09-21 ENCOUNTER — Ambulatory Visit: Payer: PRIVATE HEALTH INSURANCE | Attending: Specialist | Admitting: Physical Therapy

## 2019-09-21 ENCOUNTER — Other Ambulatory Visit: Payer: Self-pay

## 2019-09-21 DIAGNOSIS — M25611 Stiffness of right shoulder, not elsewhere classified: Secondary | ICD-10-CM | POA: Diagnosis not present

## 2019-09-21 DIAGNOSIS — M25511 Pain in right shoulder: Secondary | ICD-10-CM | POA: Insufficient documentation

## 2019-09-21 DIAGNOSIS — R600 Localized edema: Secondary | ICD-10-CM | POA: Diagnosis present

## 2019-09-21 NOTE — Therapy (Signed)
[place], [place], [address] Phone: [phone]   Fax:  [phone]  Physical Therapy Treatment  Patient Details  Name: Stacey Kelly MRN: [id number] Date of Birth: 07-Sep-1956 Referring Provider (PT): RA Theda Sers   Encounter Date: 09/21/2019  PT End of Session - 09/21/19 0838    Visit Number  24    Authorization Type  W/C    PT Start Time  0758    PT Stop Time  0842    PT Time Calculation (min)  44 min    Activity Tolerance  Patient tolerated treatment well    Behavior During Therapy  [facility] for tasks assessed/performed       Past Medical History:  Diagnosis Date  . Depression   . Heart murmur    ASYMPTOMATIC--  ECHO  1995  MILD REGURG  . Migraine   . OA (osteoarthritis)    shoulders  . PONV (postoperative nausea and vomiting)   . Rotator cuff tear, right   . Vitamin D deficiency   . Wears glasses     Past Surgical History:  Procedure Laterality Date  . BUNIONECTOMY Right 2004   BOTH SIDES OF FOOT  . CRYOTHERAPY    . HARDWARE REMOVAL Right 04/15/2014   Procedure: EXCISION OF RIGHT FOOT DORSAL HARDWARE;  Surgeon: Wylene Simmer, MD;  Location: [place];  Service: Orthopedics;  Laterality: Right;  . RIGHT SHOULDER SURGERY  X3  LAST ONE 2010   INCLUDING ROTATOR CUFF REPAIR /  RECONSTRUCTION  . SHOULDER ARTHROSCOPY WITH ROTATOR CUFF REPAIR Left 03/18/2014   Procedure: LEFT SHOULDER ARTHROSCOPY WITH EVALUATION UNDER ANESTHESIA DEBRIDEMENT SUBACROMIAL DECOMPRESSION DISTAL CLAVICAL RESECTION ROTATOR CUFF REPAIR BICEPS  TENDINOSIS;  Surgeon: Sydnee Cabal, MD;  Location: [place];  Service: Orthopedics;  Laterality: Left;  . SHOULDER ARTHROSCOPY WITH ROTATOR CUFF REPAIR Right 06/23/2019   Procedure: SHOULDER ARTHROSCOPY WITH ROTATOR CUFF REPAIR , biceps tenotomy;  Surgeon: Sydnee Cabal, MD;  [facility];  Service: Orthopedics;   Laterality: Right;  . TONSILLECTOMY  AGE 79    There were no vitals filed for this visit.  Subjective Assessment - 09/21/19 0807    Subjective  Patient reports that she is really stiff in the AM.  REports that she struggles to raise the arm up in the AM    Currently in Pain?  No/denies    Pain Location  Shoulder    Pain Orientation  Right    Pain Descriptors / Indicators  Tightness    Aggravating Factors   Just really tight         [facility] PT Assessment - 09/21/19 0001      AROM   Overall AROM Comments  measured in standing, she is very weak inthe movements away from the body    Right Shoulder Flexion  95 Degrees    Right Shoulder ABduction  70 Degrees    Right Shoulder Internal Rotation  40 Degrees    Right Shoulder External Rotation  80 Degrees      PROM   Right Shoulder Flexion  180 Degrees    Right Shoulder ABduction  180 Degrees    Right Shoulder Internal Rotation  80 Degrees    Right Shoulder External Rotation  70 Degrees                   [facility] Adult PT Treatment/Exercise - 09/21/19 0001      Shoulder  Exercises: Prone   Retraction  Right;Weights;20 reps    Retraction Weight (lbs)  4    Extension  Right;20 reps;Weights    Extension Weight (lbs)  2    Horizontal ABduction 1  Right;20 reps;Weights    Horizontal ABduction 1 Weight (lbs)  1    Other Prone Exercises  I's & Y's x 10 each    Other Prone Exercises  quadraped hands on wall      Shoulder Exercises: ROM/Strengthening   UBE (Upper Arm Bike)  constant work 30 watts x 5 minutes    Lat Pull  2 plate;20 reps    Cybex Press  1 plate;20 reps    Cybex Row  2 plate;20 reps    [employer]  all motions focus on the upper ROM, some eccentric work to get the top ROM    Thumb Tacks  '    "W" Arms  with PT overpressure               PT Short Term Goals - 07/03/19 1024      PT SHORT TERM GOAL #1   Title  independent with initial HEP    Status  Achieved        PT Long Term Goals - 09/21/19  0840      PT LONG TERM GOAL #1   Title  decrease pain 50%    Status  Achieved      PT LONG TERM GOAL #2   Title  AROM of the right shoulder to WFL's    Status  Partially Met      PT LONG TERM GOAL #3   Title  report no difficulty with dressing and doing hair    Status  Partially Met      PT LONG TERM GOAL #4   Title  sleep without pain down the arm    Status  Achieved      PT LONG TERM GOAL #5   Title  lift 5# overhead with right arm    Status  On-going            Plan - 09/21/19 0838    Clinical Impression Statement  Patient has great PROM, her issue is with the AROM in stnading against gravity, she really struggles at 90 degrees flexion and 70 degrees abduction, with assist she can go to full ROM, the Arc of motions is weak, we are starting to work eccnetrics to help this.  She does have some significant popping in this ROM, she is very linmited in functional work at shoulder height and above    PT Next Visit Plan  need to contine to gain strength in the AROM above 80 degrees, work on Nurse, children's with Plan of Care  Patient       Patient will benefit from skilled therapeutic intervention in order to improve the following deficits and impairments:  Pain, Postural dysfunction, Increased muscle spasms, Decreased scar mobility, Decreased activity tolerance, Decreased range of motion, Decreased strength, Impaired UE functional use, Impaired flexibility, Increased edema  Visit Diagnosis: Stiffness of right shoulder, not elsewhere classified  Acute pain of right shoulder  Localized edema     Problem List Patient Active Problem List   Diagnosis Date Noted  . S/P rotator cuff repair 03/18/2014    Sumner Boast., PT 09/21/2019, 8:42 AM  [place] [address] [place], [place], [address] Phone: [phone]  Fax:  [phone]  Name: Stacey Kelly MRN:  [id number] Date of Birth: 1956/07/11

## 2019-09-23 ENCOUNTER — Encounter: Payer: Self-pay | Admitting: Physical Therapy

## 2019-09-23 ENCOUNTER — Ambulatory Visit: Payer: PRIVATE HEALTH INSURANCE | Attending: Specialist | Admitting: Physical Therapy

## 2019-09-23 ENCOUNTER — Other Ambulatory Visit: Payer: Self-pay

## 2019-09-23 DIAGNOSIS — R600 Localized edema: Secondary | ICD-10-CM | POA: Diagnosis present

## 2019-09-23 DIAGNOSIS — M25511 Pain in right shoulder: Secondary | ICD-10-CM | POA: Insufficient documentation

## 2019-09-23 DIAGNOSIS — M25611 Stiffness of right shoulder, not elsewhere classified: Secondary | ICD-10-CM | POA: Insufficient documentation

## 2019-09-23 NOTE — Therapy (Signed)
[place], [place], [address] Phone: [phone]   Fax:  [phone]  Physical Therapy Treatment  Patient Details  Name: Stacey Kelly MRN: [id number] Date of Birth: [birth year] Referring Provider (PT): RA Theda Sers   Encounter Date: 09/23/2019  PT End of Session - 09/23/19 0854    Visit Number  25    PT Start Time  0802    PT Stop Time  0845    PT Time Calculation (min)  43 min    Activity Tolerance  Patient tolerated treatment well    Behavior During Therapy  [facility] for tasks assessed/performed       Past Medical History:  Diagnosis Date  . Depression   . Heart murmur    ASYMPTOMATIC--  ECHO  1995  MILD REGURG  . Migraine   . OA (osteoarthritis)    shoulders  . PONV (postoperative nausea and vomiting)   . Rotator cuff tear, right   . Vitamin D deficiency   . Wears glasses     Past Surgical History:  Procedure Laterality Date  . BUNIONECTOMY Right 2004   BOTH SIDES OF FOOT  . CRYOTHERAPY    . HARDWARE REMOVAL Right 04/15/2014   Procedure: EXCISION OF RIGHT FOOT DORSAL HARDWARE;  Surgeon: Wylene Simmer, MD;  Location: [place];  Service: Orthopedics;  Laterality: Right;  . RIGHT SHOULDER SURGERY  X3  LAST ONE 2010   INCLUDING ROTATOR CUFF REPAIR /  RECONSTRUCTION  . SHOULDER ARTHROSCOPY WITH ROTATOR CUFF REPAIR Left 03/18/2014   Procedure: LEFT SHOULDER ARTHROSCOPY WITH EVALUATION UNDER ANESTHESIA DEBRIDEMENT SUBACROMIAL DECOMPRESSION DISTAL CLAVICAL RESECTION ROTATOR CUFF REPAIR BICEPS  TENDINOSIS;  Surgeon: Sydnee Cabal, MD;  Location: [place];  Service: Orthopedics;  Laterality: Left;  . SHOULDER ARTHROSCOPY WITH ROTATOR CUFF REPAIR Right 06/23/2019   Procedure: SHOULDER ARTHROSCOPY WITH ROTATOR CUFF REPAIR , biceps tenotomy;  Surgeon: Sydnee Cabal, MD;  [facility];  Service: Orthopedics;  Laterality: Right;  . TONSILLECTOMY   AGE 63    There were no vitals filed for this visit.  Subjective Assessment - 09/23/19 0807    Subjective  Pt reports that her shoulder blade is sore, yesterday she was hurting    Currently in Pain?  Yes    Pain Score  1     Pain Location  Shoulder    Pain Orientation  Right;Anterior;Posterior                       [facility] Adult PT Treatment/Exercise - 09/23/19 0001      Shoulder Exercises: Prone   Retraction  Right;Weights;20 reps    Retraction Weight (lbs)  4    Extension  Right;20 reps;Weights    Extension Weight (lbs)  2    Horizontal ABduction 1  Right;20 reps;Weights    Horizontal ABduction 1 Weight (lbs)  1      Shoulder Exercises: Standing   Flexion  AROM;20 reps;Both    ABduction  AROM;Both;20 reps    Extension  Strengthening;Both;20 reps;Theraband    Theraband Level (Shoulder Extension)  Level 3 (Green)    Other Standing Exercises  RUE triceps ext 5lb 2x10       Shoulder Exercises: ROM/Strengthening   UBE (Upper Arm Bike)  constant work 15 watts x 5 minutes    Lat Pull  2 plate;20 reps    Cybex Press  1 plate;20 reps  Cybex Press Limitations       Cybex Row  2 plate;20 reps    Other ROM/Strengthening Exercises  RUE ER abd on [place] 2x10                PT Short Term Goals - 07/03/19 1024      PT SHORT TERM GOAL #1   Title  independent with initial HEP    Status  Achieved        PT Long Term Goals - 09/21/19 0840      PT LONG TERM GOAL #1   Title  decrease pain 50%    Status  Achieved      PT LONG TERM GOAL #2   Title  AROM of the right shoulder to WFL's    Status  Partially Met      PT LONG TERM GOAL #3   Title  report no difficulty with dressing and doing hair    Status  Partially Met      PT LONG TERM GOAL #4   Title  sleep without pain down the arm    Status  Achieved      PT LONG TERM GOAL #5   Title  lift 5# overhead with right arm    Status  On-going            Plan - 09/23/19 0855    Clinical  Impression Statement  Some soreness reported form last treatment session. PROM remains great, she does have some weakness with active motions against gravity. R shoulder elevation noted with flexion and abduction. Fatigue noted with prone exercises, most difficulty with abduction.    Stability/Clinical Decision Making  Evolving/Moderate complexity    Rehab Potential  Good    PT Frequency  3x / week    PT Duration  8 weeks    PT Treatment/Interventions  ADLs/Self Care Home Management;Cryotherapy;Electrical Stimulation;Therapeutic activities;Therapeutic exercise;Patient/family education;Manual techniques;Vasopneumatic Device    PT Next Visit Plan  need to contine to gain strength in the AROM above 80 degrees, work on eccentrics without compensation       Patient will benefit from skilled therapeutic intervention in order to improve the following deficits and impairments:  Pain, Postural dysfunction, Increased muscle spasms, Decreased scar mobility, Decreased activity tolerance, Decreased range of motion, Decreased strength, Impaired UE functional use, Impaired flexibility, Increased edema  Visit Diagnosis: Acute pain of right shoulder  Localized edema  Stiffness of right shoulder, not elsewhere classified     Problem List Patient Active Problem List   Diagnosis Date Noted  . S/P rotator cuff repair 03/18/2014    Scot Jun 09/23/2019, 8:58 AM  [place] [address] [place], [place], [address] Phone: [phone]   Fax:  [phone]  Name: Stacey Kelly MRN: [id number] Date of Birth: 08-Aug-1956

## 2019-09-28 ENCOUNTER — Encounter: Payer: Self-pay | Admitting: Physical Therapy

## 2019-09-28 ENCOUNTER — Ambulatory Visit: Payer: PRIVATE HEALTH INSURANCE | Attending: Specialist | Admitting: Physical Therapy

## 2019-09-28 ENCOUNTER — Other Ambulatory Visit: Payer: Self-pay

## 2019-09-28 ENCOUNTER — Ambulatory Visit: Payer: 59

## 2019-09-28 DIAGNOSIS — M25611 Stiffness of right shoulder, not elsewhere classified: Secondary | ICD-10-CM | POA: Diagnosis not present

## 2019-09-28 DIAGNOSIS — M25511 Pain in right shoulder: Secondary | ICD-10-CM | POA: Insufficient documentation

## 2019-09-28 DIAGNOSIS — R600 Localized edema: Secondary | ICD-10-CM | POA: Diagnosis not present

## 2019-09-28 NOTE — Therapy (Signed)
[place], [place], [address] Phone: [phone]   Fax:  [phone]  Physical Therapy Treatment  Patient Details  Name: Stacey Kelly MRN: [id number] Date of Birth: Jun 25, 1956 Referring Provider (PT): RA Theda Sers   Encounter Date: 09/28/2019  PT End of Session - 09/28/19 1053    Visit Number  26    Number of Visits     Authorization Type  W/C    PT Start Time  1050    PT Stop Time  1133    PT Time Calculation (min)  43 min    Activity Tolerance  Patient tolerated treatment well    Behavior During Therapy  [facility] for tasks assessed/performed       Past Medical History:  Diagnosis Date  . Depression   . Heart murmur    ASYMPTOMATIC--  ECHO  1995  MILD REGURG  . Migraine   . OA (osteoarthritis)    shoulders  . PONV (postoperative nausea and vomiting)   . Rotator cuff tear, right   . Vitamin D deficiency   . Wears glasses     Past Surgical History:  Procedure Laterality Date  . BUNIONECTOMY Right 2004   BOTH SIDES OF FOOT  . CRYOTHERAPY    . HARDWARE REMOVAL Right 04/15/2014   Procedure: EXCISION OF RIGHT FOOT DORSAL HARDWARE;  Surgeon: Wylene Simmer, MD;  Location: [place];  Service: Orthopedics;  Laterality: Right;  . RIGHT SHOULDER SURGERY  X3  LAST ONE 2010   INCLUDING ROTATOR CUFF REPAIR /  RECONSTRUCTION  . SHOULDER ARTHROSCOPY WITH ROTATOR CUFF REPAIR Left 03/18/2014   Procedure: LEFT SHOULDER ARTHROSCOPY WITH EVALUATION UNDER ANESTHESIA DEBRIDEMENT SUBACROMIAL DECOMPRESSION DISTAL CLAVICAL RESECTION ROTATOR CUFF REPAIR BICEPS  TENDINOSIS;  Surgeon: Sydnee Cabal, MD;  Location: [place];  Service: Orthopedics;  Laterality: Left;  . SHOULDER ARTHROSCOPY WITH ROTATOR CUFF REPAIR Right 06/23/2019   Procedure: SHOULDER ARTHROSCOPY WITH ROTATOR CUFF REPAIR , biceps tenotomy;  Surgeon: Sydnee Cabal, MD;  Location: [facility];  Service:  Orthopedics;  Laterality: Right;  . TONSILLECTOMY  AGE 84    There were no vitals filed for this visit.  Subjective Assessment - 09/28/19 1051    Subjective  pt reports she has her normal soreness, she is now able to lift the arm up close to her head easier and lift to cabinet out to the side. 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  Yes    Pain Score  1     Pain Location  Shoulder    Pain Orientation  Right    Pain Descriptors / Indicators  Dull;Sore    Pain Type  Surgical pain    Pain Frequency  Constant    Aggravating Factors   lifting up    Pain Relieving Factors  meds         [facility] PT Assessment - 09/28/19 0001      Assessment   Medical Diagnosis  s/p right shoulder RC repair    Referring Provider (PT)  RA Theda Sers                   [facility] Adult PT Treatment/Exercise - 09/28/19 0001      Shoulder Exercises: Prone   Other Prone Exercises  2x10, off corner of mat, T's with 2# palms down, thumbs up, Y's with 1# palms down, thumbs up  Other Prone Exercises  quadruped hand behind head thoracic rotations.       Shoulder Exercises: Sidelying   Other Sidelying Exercises  2x10 open book with trunk rotation  each side with VC for form      Shoulder Exercises: Standing   Other Standing Exercises  standing leaning against bolster, arm elevation to 90 degrees, single and alternating while holding opposite arm up. - isometric holds       Shoulder Exercises: ROM/Strengthening   UBE (Upper Arm Bike)  constant work 15 watts x 6 minutes      Shoulder Exercises: Stretch   Other Shoulder Stretches  thoracic openers over yoga mat with shoulder stretches overh head reaches then hands behind head and elbow presses Had to remove roll for elbow presses due to muscle spasm in rhomboids with the roll              PT Short Term Goals - 07/03/19 1024      PT SHORT TERM GOAL #1   Title  independent with initial HEP    Status  Achieved         PT Long Term Goals - 09/21/19 0840      PT LONG TERM GOAL #1   Title  decrease pain 50%    Status  Achieved      PT LONG TERM GOAL #2   Title  AROM of the right shoulder to WFL's    Status  Partially Met      PT LONG TERM GOAL #3   Title  report no difficulty with dressing and doing hair    Status  Partially Met      PT LONG TERM GOAL #4   Title  sleep without pain down the arm    Status  Achieved      PT LONG TERM GOAL #5   Title  lift 5# overhead with right arm    Status  On-going            Plan - 09/28/19 1217    Clinical Impression Statement  Pt reports no more than normal soreness in the shoulder.  She has noticed the ability to elevate the arm some better with functional tasks.  She does still use compensatory movements of the shoulder with elevation at ~ 80 degrees.  She is aware of this and is working on correcting.  Her upper back is weak and increase in thoracic kyphosis lead to poor shoulder mechanics with elevation. She returns to work this week on light duty.  Would benefit from continued tx to strength shoulder and restore normal movement patterns.    Rehab Potential  Good    PT Frequency  2x / week    PT Duration  8 weeks    PT Treatment/Interventions  ADLs/Self Care Home Management;Cryotherapy;Electrical Stimulation;Therapeutic activities;Therapeutic exercise;Patient/family education;Manual techniques;Vasopneumatic Device    PT Next Visit Plan  need to contine to gain strength in the AROM above 80 degrees, work on Nurse, mental health without compensation    Consulted and Agree with Plan of Care  Patient       Patient will benefit from skilled therapeutic intervention in order to improve the following deficits and impairments:  Pain, Postural dysfunction, Increased muscle spasms, Decreased scar mobility, Decreased activity tolerance, Decreased range of motion, Decreased strength, Impaired UE functional use, Impaired flexibility, Increased edema  Visit  Diagnosis: Acute pain of right shoulder  Localized edema  Stiffness of right shoulder, not elsewhere classified     Problem  List Patient Active Problem List   Diagnosis Date Noted  . S/P rotator cuff repair 03/18/2014    Jeral Pinch PT  09/28/2019, 12:20 PM  [place], [place], [address] Phone: [phone]   Fax:  [phone]  Name: JUNKO OHAGAN MRN: [id number] Date of Birth: [birth year]

## 2019-09-30 ENCOUNTER — Encounter: Payer: 59 | Admitting: Physical Therapy

## 2019-10-02 ENCOUNTER — Encounter: Payer: Self-pay | Admitting: Physical Therapy

## 2019-10-02 ENCOUNTER — Ambulatory Visit: Payer: PRIVATE HEALTH INSURANCE | Attending: Specialist | Admitting: Physical Therapy

## 2019-10-02 ENCOUNTER — Other Ambulatory Visit: Payer: Self-pay

## 2019-10-02 DIAGNOSIS — R600 Localized edema: Secondary | ICD-10-CM | POA: Diagnosis present

## 2019-10-02 DIAGNOSIS — M25511 Pain in right shoulder: Secondary | ICD-10-CM | POA: Insufficient documentation

## 2019-10-02 DIAGNOSIS — M25611 Stiffness of right shoulder, not elsewhere classified: Secondary | ICD-10-CM | POA: Diagnosis not present

## 2019-10-02 NOTE — Therapy (Signed)
[place] [place], [place], [address] Phone: [phone]   Fax:  [phone]  Physical Therapy Treatment  Patient Details  Name: Stacey Kelly MRN: [id number] Date of Birth: 24-May-1956 Referring Provider (PT): RA Theda Sers   Encounter Date: 10/02/2019  PT End of Session - 10/02/19 0943    Visit Number  27    Authorization Type  W/C    PT Start Time  0850    PT Stop Time  0939    PT Time Calculation (min)  49 min    Activity Tolerance  Patient tolerated treatment well    Behavior During Therapy  [facility] for tasks assessed/performed       Past Medical History:  Diagnosis Date  . Depression   . Heart murmur    ASYMPTOMATIC--  ECHO  1995  MILD REGURG  . Migraine   . OA (osteoarthritis)    shoulders  . PONV (postoperative nausea and vomiting)   . Rotator cuff tear, right   . Vitamin D deficiency   . Wears glasses     Past Surgical History:  Procedure Laterality Date  . BUNIONECTOMY Right 2004   BOTH SIDES OF FOOT  . CRYOTHERAPY    . HARDWARE REMOVAL Right 04/15/2014   Procedure: EXCISION OF RIGHT FOOT DORSAL HARDWARE;  Surgeon: Wylene Simmer, MD;  Location: [place];  Service: Orthopedics;  Laterality: Right;  . RIGHT SHOULDER SURGERY  X3  LAST ONE 2010   INCLUDING ROTATOR CUFF REPAIR /  RECONSTRUCTION  . SHOULDER ARTHROSCOPY WITH ROTATOR CUFF REPAIR Left 03/18/2014   Procedure: LEFT SHOULDER ARTHROSCOPY WITH EVALUATION UNDER ANESTHESIA DEBRIDEMENT SUBACROMIAL DECOMPRESSION DISTAL CLAVICAL RESECTION ROTATOR CUFF REPAIR BICEPS  TENDINOSIS;  Surgeon: Sydnee Cabal, MD;  Location: [place];  Service: Orthopedics;  Laterality: Left;  . SHOULDER ARTHROSCOPY WITH ROTATOR CUFF REPAIR Right 06/23/2019   Procedure: SHOULDER ARTHROSCOPY WITH ROTATOR CUFF REPAIR , biceps tenotomy;  Surgeon: Sydnee Cabal, MD;  Location: [facility];  Service: Orthopedics;   Laterality: Right;  . TONSILLECTOMY  AGE 19    There were no vitals filed for this visit.  Subjective Assessment - 10/02/19 0853    Subjective  "My arm is a 2 today  Pt reports that's he think it is from stress and charting"    Currently in Pain?  Yes    Pain Score  2     Pain Location  Shoulder    Pain Orientation  Right                       [facility] Adult PT Treatment/Exercise - 10/02/19 0001      Shoulder Exercises: Supine   External Rotation  Right;20 reps;Weights    External Rotation Weight (lbs)  1    Internal Rotation  Right;AROM;20 reps;Weights    Internal Rotation Weight (lbs)  1      Shoulder Exercises: Prone   Flexion  20 reps;Right;AROM    Extension  Right;20 reps;Weights    Extension Weight (lbs)  2    Horizontal ABduction 1  Right;20 reps;Weights    Horizontal ABduction 1 Weight (lbs)  1    Other Prone Exercises  Row then ER x 8    Other Prone Exercises  Rows 3lb 2x10      Shoulder Exercises: Standing   Flexion  20 reps;Both;Strengthening;Weights    Shoulder Flexion Weight (lbs)  2, then 1  ABduction  AROM;Both;20 reps    Extension  Strengthening;Both;20 reps;Theraband    Theraband Level (Shoulder Extension)  Level 3 (Green)    Row  Strengthening;Both;20 reps;Theraband    Theraband Level (Shoulder Row)  Level 3 (Green)    Other Standing Exercises  Abd up wall & small  circles with pilow case 2x10 RUE      Shoulder Exercises: ROM/Strengthening   UBE (Upper Arm Bike)  L4 x 3 min each way      Manual Therapy   Manual Therapy  Passive ROM    Passive ROM  RUE ER/IR               PT Short Term Goals - 07/03/19 1024      PT SHORT TERM GOAL #1   Title  independent with initial HEP    Status  Achieved        PT Long Term Goals - 09/21/19 0840      PT LONG TERM GOAL #1   Title  decrease pain 50%    Status  Achieved      PT LONG TERM GOAL #2   Title  AROM of the right shoulder to WFL's    Status  Partially Met      PT  LONG TERM GOAL #3   Title  report no difficulty with dressing and doing hair    Status  Partially Met      PT LONG TERM GOAL #4   Title  sleep without pain down the arm    Status  Achieved      PT LONG TERM GOAL #5   Title  lift 5# overhead with right arm    Status  On-going            Plan - 10/02/19 0944    Clinical Impression Statement  pt has returned to work on light duty. today she reports little pain that she thinks comes from stress and charting from work, Upper back remains very weak leading to poor shoulder mechanics. Focus time in the prone position keeping shoulder retracted. Resisted external rotation is difficult for Pt. Some tightness today with passive ER/IR, Tactile cues required to keep R shoulder from protracting with IR.    Stability/Clinical Decision Making  Evolving/Moderate complexity    Rehab Potential  Good    PT Frequency  2x / week    PT Duration  8 weeks    PT Treatment/Interventions  ADLs/Self Care Home Management;Cryotherapy;Electrical Stimulation;Therapeutic activities;Therapeutic exercise;Patient/family education;Manual techniques;Vasopneumatic Device    PT Next Visit Plan  need to contine to gain strength in the AROM above 80 degrees, work on eccentrics without compensation       Patient will benefit from skilled therapeutic intervention in order to improve the following deficits and impairments:  Pain, Postural dysfunction, Increased muscle spasms, Decreased scar mobility, Decreased activity tolerance, Decreased range of motion, Decreased strength, Impaired UE functional use, Impaired flexibility, Increased edema  Visit Diagnosis: Stiffness of right shoulder, not elsewhere classified  Acute pain of right shoulder  Localized edema     Problem List Patient Active Problem List   Diagnosis Date Noted  . S/P rotator cuff repair 03/18/2014    Scot Jun 10/02/2019, 9:48 AM  [place] [address] [place], [place], [address] Phone: [phone]   Fax:  [phone]  Name: Stacey Kelly MRN: [id number] Date of Birth: 1956/06/02

## 2019-10-05 ENCOUNTER — Other Ambulatory Visit: Payer: Self-pay

## 2019-10-06 ENCOUNTER — Ambulatory Visit (INDEPENDENT_AMBULATORY_CARE_PROVIDER_SITE_OTHER): Payer: No Typology Code available for payment source | Admitting: Obstetrics & Gynecology

## 2019-10-06 ENCOUNTER — Ambulatory Visit: Payer: PRIVATE HEALTH INSURANCE | Admitting: Physical Therapy

## 2019-10-06 ENCOUNTER — Encounter: Payer: Self-pay | Admitting: Physical Therapy

## 2019-10-06 ENCOUNTER — Encounter: Payer: Self-pay | Admitting: Obstetrics & Gynecology

## 2019-10-06 VITALS — BP 130/80 | Ht 64.0 in | Wt 160.0 lb

## 2019-10-06 DIAGNOSIS — Z01419 Encounter for gynecological examination (general) (routine) without abnormal findings: Secondary | ICD-10-CM | POA: Diagnosis not present

## 2019-10-06 DIAGNOSIS — Z780 Asymptomatic menopausal state: Secondary | ICD-10-CM | POA: Diagnosis not present

## 2019-10-06 DIAGNOSIS — M25611 Stiffness of right shoulder, not elsewhere classified: Secondary | ICD-10-CM

## 2019-10-06 DIAGNOSIS — M25511 Pain in right shoulder: Secondary | ICD-10-CM

## 2019-10-06 DIAGNOSIS — R600 Localized edema: Secondary | ICD-10-CM

## 2019-10-06 NOTE — Therapy (Signed)
[place], [place], [address] Phone: [phone]   Fax:  [phone]  Physical Therapy Treatment  Patient Details  Name: Stacey Kelly MRN: [id number] Date of Birth: 1956-02-14 Referring Provider (PT): RA Theda Sers   Encounter Date: 10/06/2019  PT End of Session - 10/06/19 0912    Visit Number  28    Authorization Type  W/C    PT Start Time  6834   in late   PT Stop Time  0915    PT Time Calculation (min)  38 min    Activity Tolerance  Patient tolerated treatment well    Behavior During Therapy  [facility] for tasks assessed/performed       Past Medical History:  Diagnosis Date  . Depression   . Heart murmur    ASYMPTOMATIC--  ECHO  1995  MILD REGURG  . Migraine   . OA (osteoarthritis)    shoulders  . PONV (postoperative nausea and vomiting)   . Rotator cuff tear, right   . Vitamin D deficiency   . Wears glasses     Past Surgical History:  Procedure Laterality Date  . BUNIONECTOMY Right 2004   BOTH SIDES OF FOOT  . CRYOTHERAPY    . HARDWARE REMOVAL Right 04/15/2014   Procedure: EXCISION OF RIGHT FOOT DORSAL HARDWARE;  Surgeon: Wylene Simmer, MD;  Location: [place];  Service: Orthopedics;  Laterality: Right;  . RIGHT SHOULDER SURGERY  X3  LAST ONE 2010   INCLUDING ROTATOR CUFF REPAIR /  RECONSTRUCTION  . SHOULDER ARTHROSCOPY WITH ROTATOR CUFF REPAIR Left 03/18/2014   Procedure: LEFT SHOULDER ARTHROSCOPY WITH EVALUATION UNDER ANESTHESIA DEBRIDEMENT SUBACROMIAL DECOMPRESSION DISTAL CLAVICAL RESECTION ROTATOR CUFF REPAIR BICEPS  TENDINOSIS;  Surgeon: Sydnee Cabal, MD;  Location: [place];  Service: Orthopedics;  Laterality: Left;  . SHOULDER ARTHROSCOPY WITH ROTATOR CUFF REPAIR Right 06/23/2019   Procedure: SHOULDER ARTHROSCOPY WITH ROTATOR CUFF REPAIR , biceps tenotomy;  Surgeon: Sydnee Cabal, MD;  Location: [facility];  Service: Orthopedics;   Laterality: Right;  . TONSILLECTOMY  AGE 63    There were no vitals filed for this visit.  Subjective Assessment - 10/06/19 0839    Subjective  Pt reports she has gone to the gym and lifted wts.  was able to raise her arm 3 times over the weekend and then it tired out.    Patient Stated Goals  get back to work    Currently in Pain?  Yes    Pain Score  1     Pain Location  Shoulder    Pain Orientation  Right    Pain Descriptors / Indicators  Dull    Pain Type  Surgical pain    Aggravating Factors   overhead    Pain Relieving Factors  meds         [facility] PT Assessment - 10/06/19 0001      Assessment   Medical Diagnosis  s/p right shoulder RC repair    Referring Provider (PT)  Jomarie Longs    Next MD Visit  11/09/2019                   [facility] Adult PT Treatment/Exercise - 10/06/19 0001      Shoulder Exercises: Supine   Other Supine Exercises  2x10, 5 sec holds horizontal abduction arms in goal post position - attempted in standing - unable . hands behind head and elbow presses  down      Shoulder Exercises: Prone   Other Prone Exercises  10 reps quadruped, thoracic rotations into thread the needle      Shoulder Exercises: Standing   Other Standing Exercises  10 reps each, leaning against bolster, full can, bilat, alternating and single, initlaly used 1# wt then dropped    Other Standing Exercises  2x10 reps FWD tip, row with 2#, then swimmer      Shoulder Exercises: ROM/Strengthening   UBE (Upper Arm Bike)  L4 x 3 min each way               PT Short Term Goals - 07/03/19 1024      PT SHORT TERM GOAL #1   Title  independent with initial HEP    Status  Achieved        PT Long Term Goals - 09/21/19 0840      PT LONG TERM GOAL #1   Title  decrease pain 50%    Status  Achieved      PT LONG TERM GOAL #2   Title  AROM of the right shoulder to WFL's    Status  Partially Met      PT LONG TERM GOAL #3   Title  report no difficulty with dressing and  doing hair    Status  Partially Met      PT LONG TERM GOAL #4   Title  sleep without pain down the arm    Status  Achieved      PT LONG TERM GOAL #5   Title  lift 5# overhead with right arm    Status  On-going            Plan - 10/06/19 0912    Clinical Impression Statement  Dyllan continues with good shoulder ROM, weak periscapular which is leading to compensatory movements with fatigue and if not concentrating. She fatigues with working the rhomboids and traps, is able to perform more reps before tiring.  Her MD appointment was changed to 1/11.    PT Frequency  2x / week    PT Duration  8 weeks    PT Treatment/Interventions  ADLs/Self Care Home Management;Cryotherapy;Electrical Stimulation;Therapeutic activities;Therapeutic exercise;Patient/family education;Manual techniques;Vasopneumatic Device    PT Next Visit Plan  need to contine to gain strength in the AROM above 80 degrees, work on Nurse, mental health without compensation    Consulted and Agree with Plan of Care  Patient       Patient will benefit from skilled therapeutic intervention in order to improve the following deficits and impairments:     Visit Diagnosis: Stiffness of right shoulder, not elsewhere classified  Acute pain of right shoulder  Localized edema     Problem List Patient Active Problem List   Diagnosis Date Noted  . S/P rotator cuff repair 03/18/2014    Jeral Pinch PT  10/06/2019, 9:15 AM  [address] [place], [place], [address] Phone: [phone]   Fax:  [phone]  Name: Stacey Kelly MRN: [id number] Date of Birth: Sep 29, 1956

## 2019-10-06 NOTE — Progress Notes (Signed)
Stacey Kelly [birth year] [id number]   History:    63 y.o. G2P1A1L1 Married.  Nurse in Medical ICU.    RP:  Established patient presenting for annual gyn exam   HPI: Menopause, well on no HRT.  No PMB.  No pelvic pain.  No pain with IC. Urine normal.  Treating constipation.  Had a Colonoscopy (benign polyp), Colon found to be tortuous.   BMI 27.46.  Breasts normal.  Health labs with Fam MD.  Past medical history,surgical history, family history and social history were all reviewed and documented in the EPIC chart.  Gynecologic History No LMP recorded. Patient is postmenopausal.  Obstetric History OB History  Gravida Para Term Preterm AB Living  2       1 1   SAB TAB Ectopic Multiple Live Births               # Outcome Date GA Lbr Len/2nd Weight Sex Delivery Anes PTL Lv  2 Gravida           1 AB              ROS: A ROS was performed and pertinent positives and negatives are included in the history.  GENERAL: No fevers or chills. HEENT: No change in vision, no earache, sore throat or sinus congestion. NECK: No pain or stiffness. CARDIOVASCULAR: No chest pain or pressure. No palpitations. PULMONARY: No shortness of breath, cough or wheeze. GASTROINTESTINAL: No abdominal pain, nausea, vomiting or diarrhea, melena or bright red blood per rectum. GENITOURINARY: No urinary frequency, urgency, hesitancy or dysuria. MUSCULOSKELETAL: No joint or muscle pain, no back pain, no recent trauma. DERMATOLOGIC: No rash, no itching, no lesions. ENDOCRINE: No polyuria, polydipsia, no heat or cold intolerance. No recent change in weight. HEMATOLOGICAL: No anemia or easy bruising or bleeding. NEUROLOGIC: No headache, seizures, numbness, tingling or weakness. PSYCHIATRIC: No depression, no loss of interest in normal activity or change in sleep pattern.     Exam:   BP 130/80   Ht 5\' 4"  (1.626 m)   Wt 160 lb (72.6 kg)   BMI 27.46 kg/m   Body mass index is 27.46 kg/m.  General appearance : Well  developed well nourished female. No acute distress HEENT: Eyes: no retinal hemorrhage or exudates,  Neck supple, trachea midline, no carotid bruits, no thyroidmegaly Lungs: Clear to auscultation, no rhonchi or wheezes, or rib retractions  Heart: Regular rate and rhythm, no murmurs or gallops Breast:Examined in sitting and supine position were symmetrical in appearance, no palpable masses or tenderness,  no skin retraction, no nipple inversion, no nipple discharge, no skin discoloration, no axillary or supraclavicular lymphadenopathy Abdomen: no palpable masses or tenderness, no rebound or guarding Extremities: no edema or skin discoloration or tenderness  Pelvic: Vulva: Normal             Vagina: No gross lesions or discharge  Cervix: No gross lesions or discharge  Uterus  AV, normal size, shape and consistency, non-tender and mobile  Adnexa  Without masses or tenderness  Anus: Normal   Assessment/Plan:  63 y.o. female for annual exam   1. Well female exam with routine gynecological exam Normal gynecologic exam in menopause.  Pap test December 2019 was negative, no indication to repeat this year.  Breast exam normal.  Screening mammogram December 2019 was negative, patient will schedule now.  Colonoscopy in 2019.  Health labs with family physician.  Body mass index 27.46.  Recommend a slightly lower calorie/carb diet such  as [employer].  Aerobic physical activities 5 times a week and light weightlifting every 2 days.  2. Postmenopausal Well on no hormone replacement therapy.  No postmenopausal bleeding.  Recommend vitamin D supplements, calcium intake of 1200 mg daily and regular weightbearing physical activities.  Other orders - loratadine (CLARITIN) 10 MG tablet; Take 10 mg by mouth daily. - Red Yeast Rice 600 MG CAPS; Take by mouth.  Princess Bruins MD, 2:59 PM 10/06/2019

## 2019-10-07 ENCOUNTER — Encounter: Payer: Self-pay | Admitting: Obstetrics & Gynecology

## 2019-10-07 ENCOUNTER — Telehealth: Payer: Self-pay | Admitting: *Deleted

## 2019-10-07 NOTE — Telephone Encounter (Signed)
Patient informed, order faxed to [facility], will call to schedule, will have copy of mammogram sent to office.

## 2019-10-07 NOTE — Patient Instructions (Signed)
1. Well female exam with routine gynecological exam Normal gynecologic exam in menopause.  Pap test December 2019 was negative, no indication to repeat this year.  Breast exam normal.  Screening mammogram December 2019 was negative, patient will schedule now.  Colonoscopy in 2019.  Health labs with family physician.  Body mass index 27.46.  Recommend a slightly lower calorie/carb diet such as [employer].  Aerobic physical activities 5 times a week and light weightlifting every 2 days.  2. Postmenopausal Well on no hormone replacement therapy.  No postmenopausal bleeding.  Recommend vitamin D supplements, calcium intake of 1200 mg daily and regular weightbearing physical activities.  Other orders - loratadine (CLARITIN) 10 MG tablet; Take 10 mg by mouth daily. - Red Yeast Rice 600 MG CAPS; Take by mouth.  Stacey Kelly, it was a pleasure seeing you today!

## 2019-10-07 NOTE — Telephone Encounter (Signed)
-----   Message from Princess Bruins, MD sent at 10/06/2019  3:14 PM EST ----- Regarding: Schedule Bone Density at [facility] Osteopenia on BD 2017.  Schedule Bone Density at [facility].  Obtain Screening Mammo report 2020.

## 2019-10-09 ENCOUNTER — Encounter: Payer: Self-pay | Admitting: Physical Therapy

## 2019-10-09 ENCOUNTER — Ambulatory Visit: Payer: PRIVATE HEALTH INSURANCE | Attending: Specialist | Admitting: Physical Therapy

## 2019-10-09 ENCOUNTER — Other Ambulatory Visit: Payer: Self-pay

## 2019-10-09 DIAGNOSIS — M25511 Pain in right shoulder: Secondary | ICD-10-CM | POA: Diagnosis present

## 2019-10-09 DIAGNOSIS — M25611 Stiffness of right shoulder, not elsewhere classified: Secondary | ICD-10-CM | POA: Insufficient documentation

## 2019-10-09 DIAGNOSIS — R600 Localized edema: Secondary | ICD-10-CM | POA: Insufficient documentation

## 2019-10-09 NOTE — Therapy (Signed)
[place], [place], [address] Phone: [phone]   Fax:  [phone]  Physical Therapy Treatment  Patient Details  Name: Stacey Kelly MRN: [id number] Date of Birth: 1955-11-27 Referring Provider (PT): RA Theda Sers   Encounter Date: 10/09/2019  PT End of Session - 10/09/19 0930    Visit Number  29    Authorization Type  W/C    PT Start Time  0855    PT Stop Time  0930    PT Time Calculation (min)  35 min       Past Medical History:  Diagnosis Date  . Depression   . Heart murmur    ASYMPTOMATIC--  ECHO  1995  MILD REGURG  . Migraine   . OA (osteoarthritis)    shoulders  . PONV (postoperative nausea and vomiting)   . Rotator cuff tear, right   . Vitamin D deficiency   . Wears glasses     Past Surgical History:  Procedure Laterality Date  . BUNIONECTOMY Right 2004   BOTH SIDES OF FOOT  . CRYOTHERAPY    . HARDWARE REMOVAL Right 04/15/2014   Procedure: EXCISION OF RIGHT FOOT DORSAL HARDWARE;  Surgeon: Wylene Simmer, MD;  Location: [facility];  Service: Orthopedics;  Laterality: Right;  . RIGHT SHOULDER SURGERY  X3  LAST ONE 2010   INCLUDING ROTATOR CUFF REPAIR /  RECONSTRUCTION  . SHOULDER ARTHROSCOPY WITH ROTATOR CUFF REPAIR Left 03/18/2014   Procedure: LEFT SHOULDER ARTHROSCOPY WITH EVALUATION UNDER ANESTHESIA DEBRIDEMENT SUBACROMIAL DECOMPRESSION DISTAL CLAVICAL RESECTION ROTATOR CUFF REPAIR BICEPS  TENDINOSIS;  Surgeon: Sydnee Cabal, MD;  Location: [place];  Service: Orthopedics;  Laterality: Left;  . SHOULDER ARTHROSCOPY WITH ROTATOR CUFF REPAIR Right 06/23/2019   Procedure: SHOULDER ARTHROSCOPY WITH ROTATOR CUFF REPAIR , biceps tenotomy;  Surgeon: Sydnee Cabal, MD;  Location: [facility];  Service: Orthopedics;  Laterality: Right;  . TONSILLECTOMY  AGE 12    There were no vitals filed for this visit.  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  Yes    Pain Location  Shoulder    Pain Orientation  Right                       [facility] Adult PT Treatment/Exercise - 10/09/19 0001      Shoulder Exercises: Standing   External Rotation  Strengthening;Both;Theraband;15 reps   x2   Theraband Level (Shoulder External Rotation)  Level 1 (Yellow)    Internal Rotation  Right;Theraband;15 reps   x2   Theraband Level (Shoulder Internal Rotation)  Level 1 (Yellow)    Flexion  20 reps;Both;Strengthening;Weights    Other Standing Exercises  2 level cabinet reaches 1lb flex x10 Abs no weight 2x10    Other Standing Exercises  Row with ER 2x10, Ext with ER 2x10       Shoulder Exercises: ROM/Strengthening   UBE (Upper Arm Bike)  L5 x 3 min each way    Other ROM/Strengthening Exercises  Rows and Lats 25lb 2x10      Manual Therapy   Manual Therapy  Passive ROM    Passive ROM  RUE ER/IR               PT Short Term Goals - 07/03/19  1024      PT SHORT TERM GOAL #1   Title  independent with initial HEP    Status  Achieved        PT Long Term Goals - 09/21/19 0840      PT LONG TERM GOAL #1   Title  decrease pain 50%    Status  Achieved      PT LONG TERM GOAL #2   Title  AROM of the right shoulder to WFL's    Status  Partially Met      PT LONG TERM GOAL #3   Title  report no difficulty with dressing and doing hair    Status  Partially Met      PT LONG TERM GOAL #4   Title  sleep without pain down the arm    Status  Achieved      PT LONG TERM GOAL #5   Title  lift 5# overhead with right arm    Status  On-going            Plan - 10/09/19 0931    Clinical Impression Statement  Pt ~ 10 minutes late for today's treatment session. R shoulder weakness remains with flexion and abduction. Added some compound row and extensions movements, some difficulty with ER at  the end of the motion. Full PROM noted with MT.    Stability/Clinical Decision Making  Evolving/Moderate complexity    Rehab Potential  Good    PT Frequency  2x / week    PT Duration  8 weeks    PT Treatment/Interventions  ADLs/Self Care Home Management;Cryotherapy;Electrical Stimulation;Therapeutic activities;Therapeutic exercise;Patient/family education;Manual techniques;Vasopneumatic Device    PT Next Visit Plan  need to contine to gain strength in the AROM above 80 degrees, work on eccentrics without compensation       Patient will benefit from skilled therapeutic intervention in order to improve the following deficits and impairments:  Pain, Postural dysfunction, Increased muscle spasms, Decreased scar mobility, Decreased activity tolerance, Decreased range of motion, Decreased strength, Impaired UE functional use, Impaired flexibility, Increased edema  Visit Diagnosis: Stiffness of right shoulder, not elsewhere classified  Acute pain of right shoulder  Localized edema     Problem List Patient Active Problem List   Diagnosis Date Noted  . S/P rotator cuff repair 03/18/2014    Scot Jun 10/09/2019, 9:37 AM  [address] [place], [place], [address] Phone: [phone]   Fax:  [phone]  Name: Stacey Kelly MRN: [id number] Date of Birth: 06-16-1956

## 2019-10-12 ENCOUNTER — Other Ambulatory Visit: Payer: Self-pay

## 2019-10-12 ENCOUNTER — Ambulatory Visit: Payer: PRIVATE HEALTH INSURANCE | Attending: Specialist | Admitting: Physical Therapy

## 2019-10-12 ENCOUNTER — Encounter: Payer: Self-pay | Admitting: Physical Therapy

## 2019-10-12 DIAGNOSIS — R600 Localized edema: Secondary | ICD-10-CM | POA: Insufficient documentation

## 2019-10-12 DIAGNOSIS — M25611 Stiffness of right shoulder, not elsewhere classified: Secondary | ICD-10-CM | POA: Diagnosis present

## 2019-10-12 DIAGNOSIS — M25511 Pain in right shoulder: Secondary | ICD-10-CM | POA: Diagnosis present

## 2019-10-12 MED FILL — EPINEPHRINE 0.3 MG AUTO-INJ: 0.3 | 2 days supply | Qty: 2 | Fill #0

## 2019-10-12 NOTE — Therapy (Signed)
[place], [place], [address] Phone: [phone]   Fax:  [phone]  Physical Therapy Treatment  Patient Details  Name: Stacey Kelly MRN: [id number] Date of Birth: 17-Oct-1956 Referring Provider (PT): RA Theda Sers   Encounter Date: 10/12/2019  PT End of Session - 10/12/19 0842    Visit Number  30    Authorization Type  W/C    PT Start Time  0805    PT Stop Time  0845    PT Time Calculation (min)  40 min    Activity Tolerance  Patient tolerated treatment well    Behavior During Therapy  [facility] for tasks assessed/performed       Past Medical History:  Diagnosis Date  . Depression   . Heart murmur    ASYMPTOMATIC--  ECHO  1995  MILD REGURG  . Migraine   . OA (osteoarthritis)    shoulders  . PONV (postoperative nausea and vomiting)   . Rotator cuff tear, right   . Vitamin D deficiency   . Wears glasses     Past Surgical History:  Procedure Laterality Date  . BUNIONECTOMY Right 2004   BOTH SIDES OF FOOT  . CRYOTHERAPY    . HARDWARE REMOVAL Right 04/15/2014   Procedure: EXCISION OF RIGHT FOOT DORSAL HARDWARE;  Surgeon: Wylene Simmer, MD;  Location: [place];  Service: Orthopedics;  Laterality: Right;  . RIGHT SHOULDER SURGERY  X3  LAST ONE 2010   INCLUDING ROTATOR CUFF REPAIR /  RECONSTRUCTION  . SHOULDER ARTHROSCOPY WITH ROTATOR CUFF REPAIR Left 03/18/2014   Procedure: LEFT SHOULDER ARTHROSCOPY WITH EVALUATION UNDER ANESTHESIA DEBRIDEMENT SUBACROMIAL DECOMPRESSION DISTAL CLAVICAL RESECTION ROTATOR CUFF REPAIR BICEPS  TENDINOSIS;  Surgeon: Sydnee Cabal, MD;  Location: [place];  Service: Orthopedics;  Laterality: Left;  . SHOULDER ARTHROSCOPY WITH ROTATOR CUFF REPAIR Right 06/23/2019   Procedure: SHOULDER ARTHROSCOPY WITH ROTATOR CUFF REPAIR , biceps tenotomy;  Surgeon: Sydnee Cabal, MD;  Location: [facility];  Service: Orthopedics;   Laterality: Right;  . TONSILLECTOMY  AGE 39    There were no vitals filed for this visit.  Subjective Assessment - 10/12/19 0808    Subjective  Pt reports that's he could not raise her arm above her head Saturday morning, went and got a massage finding knots around her shoulder blade, then she could raise it. Just sore today    Currently in Pain?  Yes    Pain Score  2     Pain Location  Shoulder    Pain Orientation  Right    Pain Descriptors / Indicators  Sore         [facility] PT Assessment - 10/12/19 0001      AROM   Overall AROM Comments  measured in standing, she is very weak inthe movements away from the body    Right Shoulder Flexion  164 Degrees    Right Shoulder ABduction  145 Degrees    Right Shoulder Internal Rotation  42 Degrees    Right Shoulder External Rotation  83 Degrees                   [facility] Adult PT Treatment/Exercise - 10/12/19 0001      Shoulder Exercises: Standing   Flexion  20 reps;Both;Strengthening;Weights    Shoulder Flexion Weight (lbs)  1    ABduction  Both;20 reps;Strengthening;Weights    Shoulder ABduction Weight (lbs)  1  Row  Strengthening;Both;20 reps;Theraband   With ER    Theraband Level (Shoulder Row)  Level 1 (Yellow)    Other Standing Exercises  3 level cabinet reaches flex and abd, assist needed to reach 3rd level with both movements x10 each    Other Standing Exercises  Triceps ext 25lb 2x10, biceps curls 20lb 2x10       Shoulder Exercises: ROM/Strengthening   UBE (Upper Arm Bike)  L5 x 2 min each way    Nustep  UE only L1 x3 min     Other ROM/Strengthening Exercises  Rows and Lats 25lb 2x10    Other ROM/Strengthening Exercises  chest press 5lb 2x10                PT Short Term Goals - 07/03/19 1024      PT SHORT TERM GOAL #1   Title  independent with initial HEP    Status  Achieved        PT Long Term Goals - 09/21/19 0840      PT LONG TERM GOAL #1   Title  decrease pain 50%    Status  Achieved       PT LONG TERM GOAL #2   Title  AROM of the right shoulder to WFL's    Status  Partially Met      PT LONG TERM GOAL #3   Title  report no difficulty with dressing and doing hair    Status  Partially Met      PT LONG TERM GOAL #4   Title  sleep without pain down the arm    Status  Achieved      PT LONG TERM GOAL #5   Title  lift 5# overhead with right arm    Status  On-going            Plan - 10/12/19 0843    Clinical Impression Statement  Pt has progressed increasing her flexion and abduction ROM. R shoulder remains very weak with motion above 80 degrees. Increase resistance tolerated with triceps extensions. She was able to add some resistance to flexion and abduction. Rows with ER remains difficult.    Stability/Clinical Decision Making  Evolving/Moderate complexity    Rehab Potential  Good    PT Frequency  2x / week    PT Duration  8 weeks    PT Treatment/Interventions  ADLs/Self Care Home Management;Cryotherapy;Electrical Stimulation;Therapeutic activities;Therapeutic exercise;Patient/family education;Manual techniques;Vasopneumatic Device    PT Next Visit Plan  need to contine to gain strength in the AROM above 80 degrees, work on eccentrics without compensation       Patient will benefit from skilled therapeutic intervention in order to improve the following deficits and impairments:  Pain, Postural dysfunction, Increased muscle spasms, Decreased scar mobility, Decreased activity tolerance, Decreased range of motion, Decreased strength, Impaired UE functional use, Impaired flexibility, Increased edema  Visit Diagnosis: Stiffness of right shoulder, not elsewhere classified  Acute pain of right shoulder  Localized edema     Problem List Patient Active Problem List   Diagnosis Date Noted  . S/P rotator cuff repair 03/18/2014    Scot Jun, PTA 10/12/2019, 8:46 AM  [place]  [address], [place], [address] Phone: [phone]   Fax:  [phone]  Name: Stacey Kelly MRN: [id number] Date of Birth: March 30, 1956

## 2019-10-15 ENCOUNTER — Ambulatory Visit: Payer: PRIVATE HEALTH INSURANCE | Admitting: Physical Therapy

## 2019-10-15 ENCOUNTER — Encounter: Payer: Self-pay | Admitting: Physical Therapy

## 2019-10-15 ENCOUNTER — Other Ambulatory Visit: Payer: Self-pay

## 2019-10-15 DIAGNOSIS — M25611 Stiffness of right shoulder, not elsewhere classified: Secondary | ICD-10-CM | POA: Diagnosis not present

## 2019-10-15 DIAGNOSIS — M25511 Pain in right shoulder: Secondary | ICD-10-CM

## 2019-10-15 DIAGNOSIS — R600 Localized edema: Secondary | ICD-10-CM

## 2019-10-15 NOTE — Therapy (Signed)
[place], [place], [address] Phone: [phone]   Fax:  [phone]  Physical Therapy Treatment  Patient Details  Name: Stacey Kelly MRN: [id number] Date of Birth: [birth year] Referring Provider (PT): RA Theda Sers   Encounter Date: 10/15/2019  PT End of Session - 10/15/19 0843    Visit Number  31    Authorization Type  W/C    PT Start Time  0803    PT Stop Time  0845    PT Time Calculation (min)  42 min    Activity Tolerance  Patient tolerated treatment well    Behavior During Therapy  [facility] for tasks assessed/performed       Past Medical History:  Diagnosis Date  . Depression   . Heart murmur    ASYMPTOMATIC--  ECHO  1995  MILD REGURG  . Migraine   . OA (osteoarthritis)    shoulders  . PONV (postoperative nausea and vomiting)   . Rotator cuff tear, right   . Vitamin D deficiency   . Wears glasses     Past Surgical History:  Procedure Laterality Date  . BUNIONECTOMY Right 2004   BOTH SIDES OF FOOT  . CRYOTHERAPY    . HARDWARE REMOVAL Right 04/15/2014   Procedure: EXCISION OF RIGHT FOOT DORSAL HARDWARE;  Surgeon: Wylene Simmer, MD;  Location: [place];  Service: Orthopedics;  Laterality: Right;  . RIGHT SHOULDER SURGERY  X3  LAST ONE 2010   INCLUDING ROTATOR CUFF REPAIR /  RECONSTRUCTION  . SHOULDER ARTHROSCOPY WITH ROTATOR CUFF REPAIR Left 03/18/2014   Procedure: LEFT SHOULDER ARTHROSCOPY WITH EVALUATION UNDER ANESTHESIA DEBRIDEMENT SUBACROMIAL DECOMPRESSION DISTAL CLAVICAL RESECTION ROTATOR CUFF REPAIR BICEPS  TENDINOSIS;  Surgeon: Sydnee Cabal, MD;  Location: [place];  Service: Orthopedics;  Laterality: Left;  . SHOULDER ARTHROSCOPY WITH ROTATOR CUFF REPAIR Right 06/23/2019   Procedure: SHOULDER ARTHROSCOPY WITH ROTATOR CUFF REPAIR , biceps tenotomy;  Surgeon: Sydnee Cabal, MD;  Location: [facility];  Service: Orthopedics;   Laterality: Right;  . TONSILLECTOMY  AGE 63    There were no vitals filed for this visit.  Subjective Assessment - 10/15/19 0805    Subjective  "Its like a 2, I think it is the stress added going back to work"    Currently in Pain?  Yes    Pain Score  2     Pain Location  Shoulder    Pain Orientation  Right                       [facility] Adult PT Treatment/Exercise - 10/15/19 0001      Shoulder Exercises: Seated   Other Seated Exercises  Bent over Rows, flys, and Ext 2x10       Shoulder Exercises: Standing   Flexion  20 reps;Both;Strengthening;10 reps   2 second hold   Shoulder Flexion Weight (lbs)  1    ABduction  Both;Strengthening;Weights;20 reps    Shoulder ABduction Weight (lbs)  1    Extension  Strengthening;Both;20 reps;Theraband   With ER   Theraband Level (Shoulder Extension)  Level 1 (Yellow)    Row  Strengthening;Both;20 reps;Theraband   Er at end    Theraband Level (Shoulder Row)  Level 1 (Yellow)    Other Standing Exercises  Forward reaches 2x10 with RUE , then x5 with 1lb    Other Standing Exercises  Triceps ext 35lb 2x10  Shoulder Exercises: ROM/Strengthening   UBE (Upper Arm Bike)  L5 x 3 min each way    Other ROM/Strengthening Exercises  Rows and Lats 25lb 2x10    Other ROM/Strengthening Exercises  chest press 5lb 2x10                PT Short Term Goals - 07/03/19 1024      PT SHORT TERM GOAL #1   Title  independent with initial HEP    Status  Achieved        PT Long Term Goals - 09/21/19 0840      PT LONG TERM GOAL #1   Title  decrease pain 50%    Status  Achieved      PT LONG TERM GOAL #2   Title  AROM of the right shoulder to WFL's    Status  Partially Met      PT LONG TERM GOAL #3   Title  report no difficulty with dressing and doing hair    Status  Partially Met      PT LONG TERM GOAL #4   Title  sleep without pain down the arm    Status  Achieved      PT LONG TERM GOAL #5   Title  lift 5# overhead  with right arm    Status  On-going            Plan - 10/15/19 0844    Clinical Impression Statement  Pt reports some pain that she thinks comes from work stress. Reports some difficulty reaching out changing the station on her radio. Today she was ale to complete all exercises, cues to keep shoulder retracted with seated rows. Instructed to set scapulas with lat pull downs. Shoulder elevation noted with flexion and abduction. Increase weight tolerated with triceps extension.    Stability/Clinical Decision Making  Evolving/Moderate complexity    Rehab Potential  Good    PT Frequency  2x / week    PT Duration  8 weeks    PT Next Visit Plan  need to continue to gain strength in the AROM above 80 degrees, work on eccentrics without compensation       Patient will benefit from skilled therapeutic intervention in order to improve the following deficits and impairments:  Pain, Postural dysfunction, Increased muscle spasms, Decreased scar mobility, Decreased activity tolerance, Decreased range of motion, Decreased strength, Impaired UE functional use, Impaired flexibility, Increased edema  Visit Diagnosis: Stiffness of right shoulder, not elsewhere classified  Acute pain of right shoulder  Localized edema     Problem List Patient Active Problem List   Diagnosis Date Noted  . S/P rotator cuff repair 03/18/2014    Scot Jun, PTA 10/15/2019, 8:50 AM  [place] [address] [place], [place], [address] Phone: [phone]   Fax:  [phone]  Name: Stacey Kelly MRN: [id number] Date of Birth: Feb 12, 1956

## 2019-10-19 ENCOUNTER — Other Ambulatory Visit: Payer: Self-pay

## 2019-10-19 ENCOUNTER — Encounter: Payer: Self-pay | Admitting: Physical Therapy

## 2019-10-19 ENCOUNTER — Ambulatory Visit: Payer: PRIVATE HEALTH INSURANCE | Attending: Specialist | Admitting: Physical Therapy

## 2019-10-19 DIAGNOSIS — M25511 Pain in right shoulder: Secondary | ICD-10-CM | POA: Diagnosis present

## 2019-10-19 DIAGNOSIS — R600 Localized edema: Secondary | ICD-10-CM | POA: Insufficient documentation

## 2019-10-19 DIAGNOSIS — M25611 Stiffness of right shoulder, not elsewhere classified: Secondary | ICD-10-CM | POA: Diagnosis not present

## 2019-10-19 NOTE — Therapy (Signed)
[place], [place], [address] Phone: [phone]   Fax:  [phone]  Physical Therapy Treatment  Patient Details  Name: Stacey Kelly MRN: [id number] Date of Birth: 61-04-1956 Referring Provider (PT): RA Theda Sers   Encounter Date: 10/19/2019  PT End of Session - 10/19/19 1409    Visit Number  32    Authorization Type  W/C    PT Start Time  1316    PT Stop Time  1400    PT Time Calculation (min)  44 min    Activity Tolerance  Patient tolerated treatment well    Behavior During Therapy  [facility] for tasks assessed/performed       Past Medical History:  Diagnosis Date  . Depression   . Heart murmur    ASYMPTOMATIC--  ECHO  1995  MILD REGURG  . Migraine   . OA (osteoarthritis)    shoulders  . PONV (postoperative nausea and vomiting)   . Rotator cuff tear, right   . Vitamin D deficiency   . Wears glasses     Past Surgical History:  Procedure Laterality Date  . BUNIONECTOMY Right 2004   BOTH SIDES OF FOOT  . CRYOTHERAPY    . HARDWARE REMOVAL Right 04/15/2014   Procedure: EXCISION OF RIGHT FOOT DORSAL HARDWARE;  Surgeon: Wylene Simmer, MD;  Location: [place];  Service: Orthopedics;  Laterality: Right;  . RIGHT SHOULDER SURGERY  X3  LAST ONE 2010   INCLUDING ROTATOR CUFF REPAIR /  RECONSTRUCTION  . SHOULDER ARTHROSCOPY WITH ROTATOR CUFF REPAIR Left 03/18/2014   Procedure: LEFT SHOULDER ARTHROSCOPY WITH EVALUATION UNDER ANESTHESIA DEBRIDEMENT SUBACROMIAL DECOMPRESSION DISTAL CLAVICAL RESECTION ROTATOR CUFF REPAIR BICEPS  TENDINOSIS;  Surgeon: Sydnee Cabal, MD;  Location: [place];  Service: Orthopedics;  Laterality: Left;  . SHOULDER ARTHROSCOPY WITH ROTATOR CUFF REPAIR Right 06/23/2019   Procedure: SHOULDER ARTHROSCOPY WITH ROTATOR CUFF REPAIR , biceps tenotomy;  Surgeon: Sydnee Cabal, MD;  Location: [facility];  Service: Orthopedics;   Laterality: Right;  . TONSILLECTOMY  AGE 63    There were no vitals filed for this visit.  Subjective Assessment - 10/19/19 1321    Subjective  Patient reports that she is working > 40 hours a week and stress is getting her mms very tight, reports that the shoulder locked up on her due to the mm spasms this weekend    Currently in Pain?  Yes    Pain Score  3     Pain Location  Shoulder    Pain Orientation  Right    Aggravating Factors   stress and reaching out                       [facility] Adult PT Treatment/Exercise - 10/19/19 0001      Shoulder Exercises: ROM/Strengthening   UBE (Upper Arm Bike)  L5 x 3 min each way    Lat Pull  2 plate;20 reps    Cybex Press  1 plate;20 reps      Manual Therapy   Manual Therapy  Passive ROM;Soft tissue mobilization    Soft tissue mobilization  to the right shoulder, teres, rhomboid and into the upper trap area, use of some virbration    Passive ROM  Right GH joint all motions a lot of isomewtrics in varying angles to try to loosen the shoulder some  PT Short Term Goals - 07/03/19 1024      PT SHORT TERM GOAL #1   Title  independent with initial HEP    Status  Achieved        PT Long Term Goals - 10/19/19 1415      PT LONG TERM GOAL #3   Title  report no difficulty with dressing and doing hair    Status  Partially Met            Plan - 10/19/19 1409    Clinical Impression Statement  Patietn reports that she has the shoulder feel like it locked up on her, reports that it was spasms in the shoulder area, I used a lot of varying angle isometrics and some STM with ibration to help this and after she was pretty good movements, prior has some difficulty going past 90 degrees.  Has spasms in the rhomboids and the teres    PT Next Visit Plan  if doing well work on the strength and ROM, if still having difficulty will work on what we did today    Consulted and Agree with Plan of Care  Patient        Patient will benefit from skilled therapeutic intervention in order to improve the following deficits and impairments:  Pain, Postural dysfunction, Increased muscle spasms, Decreased scar mobility, Decreased activity tolerance, Decreased range of motion, Decreased strength, Impaired UE functional use, Impaired flexibility, Increased edema  Visit Diagnosis: Stiffness of right shoulder, not elsewhere classified  Acute pain of right shoulder  Localized edema     Problem List Patient Active Problem List   Diagnosis Date Noted  . S/P rotator cuff repair 03/18/2014    Sumner Boast., PT 10/19/2019, 2:15 PM  [place] [address] [place], [place], [address] Phone: [phone]   Fax:  [phone]  Name: Stacey Kelly MRN: [id number] Date of Birth: 12/28/1955

## 2019-10-22 ENCOUNTER — Ambulatory Visit: Payer: PRIVATE HEALTH INSURANCE | Attending: Specialist | Admitting: Physical Therapy

## 2019-10-22 ENCOUNTER — Encounter: Payer: Self-pay | Admitting: Physical Therapy

## 2019-10-22 ENCOUNTER — Other Ambulatory Visit: Payer: Self-pay

## 2019-10-22 DIAGNOSIS — R600 Localized edema: Secondary | ICD-10-CM | POA: Insufficient documentation

## 2019-10-22 DIAGNOSIS — M25511 Pain in right shoulder: Secondary | ICD-10-CM | POA: Insufficient documentation

## 2019-10-22 DIAGNOSIS — M25611 Stiffness of right shoulder, not elsewhere classified: Secondary | ICD-10-CM | POA: Diagnosis not present

## 2019-10-22 NOTE — Therapy (Signed)
[place] [place] [facility] [place], [place], [address] Phone: [phone]   Fax:  [phone]  Physical Therapy Treatment  Patient Details  Name: Stacey Kelly MRN: [id number] Date of Birth: 1956-10-25 Referring Provider (PT): RA Theda Sers   Encounter Date: 10/22/2019  PT End of Session - 10/22/19 0843    Visit Number  31    Authorization Type  W/C    PT Start Time  0800       Past Medical History:  Diagnosis Date  . Depression   . Heart murmur    ASYMPTOMATIC--  ECHO  1995  MILD REGURG  . Migraine   . OA (osteoarthritis)    shoulders  . PONV (postoperative nausea and vomiting)   . Rotator cuff tear, right   . Vitamin D deficiency   . Wears glasses     Past Surgical History:  Procedure Laterality Date  . BUNIONECTOMY Right 2004   BOTH SIDES OF FOOT  . CRYOTHERAPY    . HARDWARE REMOVAL Right 04/15/2014   Procedure: EXCISION OF RIGHT FOOT DORSAL HARDWARE;  Surgeon: Wylene Simmer, MD;  Location: [place];  Service: Orthopedics;  Laterality: Right;  . RIGHT SHOULDER SURGERY  X3  LAST ONE 2010   INCLUDING ROTATOR CUFF REPAIR /  RECONSTRUCTION  . SHOULDER ARTHROSCOPY WITH ROTATOR CUFF REPAIR Left 03/18/2014   Procedure: LEFT SHOULDER ARTHROSCOPY WITH EVALUATION UNDER ANESTHESIA DEBRIDEMENT SUBACROMIAL DECOMPRESSION DISTAL CLAVICAL RESECTION ROTATOR CUFF REPAIR BICEPS  TENDINOSIS;  Surgeon: Sydnee Cabal, MD;  Location: [place];  Service: Orthopedics;  Laterality: Left;  . SHOULDER ARTHROSCOPY WITH ROTATOR CUFF REPAIR Right 06/23/2019   Procedure: SHOULDER ARTHROSCOPY WITH ROTATOR CUFF REPAIR , biceps tenotomy;  Surgeon: Sydnee Cabal, MD;  Location: [facility];  Service: Orthopedics;  Laterality: Right;  . TONSILLECTOMY  AGE 15    There were no vitals filed for this visit.  Subjective Assessment - 10/22/19 0806    Subjective  "About a 2, it is just hurting, I  worked the last two days"    Currently in Pain?  Yes    Pain Score  2     Pain Location  Shoulder    Pain Orientation  Right                       [facility] Adult PT Treatment/Exercise - 10/22/19 0001      Shoulder Exercises: Standing   Flexion  20 reps;Both;Strengthening;10 reps    Shoulder Flexion Weight (lbs)  1    Extension  Strengthening;Both;20 reps;Theraband   with ER    Theraband Level (Shoulder Extension)  Level 1 (Yellow)    Row  Strengthening;Both;20 reps;Theraband   with ER    Theraband Level (Shoulder Row)  Level 1 (Yellow)    Other Standing Exercises  RUE D2 flex 2x10    Other Standing Exercises  Triceps ext 35lb 2x10      Shoulder Exercises: ROM/Strengthening   UBE (Upper Arm Bike)  L5 x 3 min each way    Other ROM/Strengthening Exercises  Rows 35lb and Lats 25lb 2x10      Manual Therapy   Manual Therapy  Passive ROM;Soft tissue mobilization    Passive ROM  Right GH joint all motions some  isomewtrics in varying angles to try to loosen the shoulder some               PT Short Term Goals -  07/03/19 1024      PT SHORT TERM GOAL #1   Title  independent with initial HEP    Status  Achieved        PT Long Term Goals - 10/19/19 1415      PT LONG TERM GOAL #3   Title  report no difficulty with dressing and doing hair    Status  Partially Met            Plan - 10/22/19 0844    Clinical Impression Statement  Pt is doing a little better today, She was able to reach above today. Rows and extensions with end range ER remains difficult. Increase weight tolerated with seated rows. PROM was good, some popping noted with abduction.    Stability/Clinical Decision Making  Evolving/Moderate complexity    Rehab Potential  Good    PT Frequency  2x / week    PT Duration  8 weeks    PT Treatment/Interventions  ADLs/Self Care Home Management;Cryotherapy;Electrical Stimulation;Therapeutic activities;Therapeutic exercise;Patient/family  education;Manual techniques;Vasopneumatic Device    PT Next Visit Plan  Strength and ROM, MT as needed       Patient will benefit from skilled therapeutic intervention in order to improve the following deficits and impairments:  Pain, Postural dysfunction, Increased muscle spasms, Decreased scar mobility, Decreased activity tolerance, Decreased range of motion, Decreased strength, Impaired UE functional use, Impaired flexibility, Increased edema  Visit Diagnosis: Stiffness of right shoulder, not elsewhere classified  Localized edema  Acute pain of right shoulder     Problem List Patient Active Problem List   Diagnosis Date Noted  . S/P rotator cuff repair 03/18/2014    Scot Jun, PTA 10/22/2019, 8:51 AM  [place] [address] [place], [place], [address] Phone: [phone]   Fax:  [phone]  Name: Stacey Kelly MRN: [id number] Date of Birth: [birth year]

## 2019-10-26 ENCOUNTER — Encounter: Payer: Self-pay | Admitting: Physical Therapy

## 2019-10-26 ENCOUNTER — Other Ambulatory Visit: Payer: Self-pay

## 2019-10-26 ENCOUNTER — Ambulatory Visit: Payer: PRIVATE HEALTH INSURANCE | Attending: Specialist | Admitting: Physical Therapy

## 2019-10-26 DIAGNOSIS — R600 Localized edema: Secondary | ICD-10-CM | POA: Diagnosis present

## 2019-10-26 DIAGNOSIS — M25611 Stiffness of right shoulder, not elsewhere classified: Secondary | ICD-10-CM

## 2019-10-26 DIAGNOSIS — M25511 Pain in right shoulder: Secondary | ICD-10-CM | POA: Insufficient documentation

## 2019-10-26 NOTE — Therapy (Signed)
[place] [place], [place], [address] Phone: [phone]   Fax:  [phone]  Physical Therapy Treatment  Patient Details  Name: Stacey Kelly MRN: [id number] Date of Birth: 1956-01-26 Referring Provider (PT): RA Theda Sers   Encounter Date: 10/26/2019  PT End of Session - 10/26/19 0842    Visit Number  34    Authorization Type  W/C    PT Start Time  0805    PT Stop Time  0845    PT Time Calculation (min)  40 min    Activity Tolerance  Patient tolerated treatment well    Behavior During Therapy  [facility] for tasks assessed/performed       Past Medical History:  Diagnosis Date  . Depression   . Heart murmur    ASYMPTOMATIC--  ECHO  1995  MILD REGURG  . Migraine   . OA (osteoarthritis)    shoulders  . PONV (postoperative nausea and vomiting)   . Rotator cuff tear, right   . Vitamin D deficiency   . Wears glasses     Past Surgical History:  Procedure Laterality Date  . BUNIONECTOMY Right 2004   BOTH SIDES OF FOOT  . CRYOTHERAPY    . HARDWARE REMOVAL Right 04/15/2014   Procedure: EXCISION OF RIGHT FOOT DORSAL HARDWARE;  Surgeon: Wylene Simmer, MD;  Location: [place];  Service: Orthopedics;  Laterality: Right;  . RIGHT SHOULDER SURGERY  X3  LAST ONE 2010   INCLUDING ROTATOR CUFF REPAIR /  RECONSTRUCTION  . SHOULDER ARTHROSCOPY WITH ROTATOR CUFF REPAIR Left 03/18/2014   Procedure: LEFT SHOULDER ARTHROSCOPY WITH EVALUATION UNDER ANESTHESIA DEBRIDEMENT SUBACROMIAL DECOMPRESSION DISTAL CLAVICAL RESECTION ROTATOR CUFF REPAIR BICEPS  TENDINOSIS;  Surgeon: Sydnee Cabal, MD;  Location: [place];  Service: Orthopedics;  Laterality: Left;  . SHOULDER ARTHROSCOPY WITH ROTATOR CUFF REPAIR Right 06/23/2019   Procedure: SHOULDER ARTHROSCOPY WITH ROTATOR CUFF REPAIR , biceps tenotomy;  Surgeon: Sydnee Cabal, MD;  Location: [facility];  Service: Orthopedics;   Laterality: Right;  . TONSILLECTOMY  AGE 63    There were no vitals filed for this visit.  Subjective Assessment - 10/26/19 0808    Subjective  "I did a lot of overhead light stuff yesterday at work"    Currently in Pain?  Yes    Pain Score  2     Pain Location  Shoulder    Pain Orientation  Right         [facility] PT Assessment - 10/26/19 0001      AROM   Overall AROM Comments  measured in standing, she is very weak inthe movements away from the body    Right Shoulder Flexion  167 Degrees    Right Shoulder ABduction  155 Degrees    Right Shoulder Internal Rotation  55 Degrees    Right Shoulder External Rotation  90 Degrees                   [facility] Adult PT Treatment/Exercise - 10/26/19 0001      Shoulder Exercises: Prone   Other Prone Exercises  3 pt SA rev flys 1lb 2x5      Shoulder Exercises: Standing   External Rotation  Right;10 reps;Theraband    Theraband Level (Shoulder External Rotation)  Level 2 (Red)    Internal Rotation  Strengthening;Right;20 reps;Theraband    Theraband Level (Shoulder Internal Rotation)  Level 2 (Red)    Flexion  20 reps;Both;Strengthening;Theraband    Theraband Level (Shoulder Flexion)  Level 1 (Yellow)    Shoulder Flexion Weight (lbs)  1    Extension  Strengthening;Both;Theraband;15 reps   x2   Extension Weight (lbs)  5lb    Other Standing Exercises  RUE D2 flex 2x10, 3 level cobinet reaches flex and abd x 10 each     Other Standing Exercises  Triceps ext 35lb 2x15      Shoulder Exercises: ROM/Strengthening   UBE (Upper Arm Bike)  L5 x 3 min each way    Other ROM/Strengthening Exercises  Rows 35lb and Lats 35lb 2x10               PT Short Term Goals - 07/03/19 1024      PT SHORT TERM GOAL #1   Title  independent with initial HEP    Status  Achieved        PT Long Term Goals - 10/26/19 4827      PT LONG TERM GOAL #2   Title  AROM of the right shoulder to WFL's    Status  Partially Met      PT LONG TERM  GOAL #3   Title  report no difficulty with dressing and doing hair    Status  Partially Met      PT LONG TERM GOAL #4   Title  sleep without pain down the arm    Status  Achieved      PT LONG TERM GOAL #5   Title  lift 5# overhead with right arm    Status  On-going            Plan - 10/26/19 0843    Clinical Impression Statement  Pt has progressed increasing her R shoulder ROM in all directions. She remains weak with  motions away from body. Cues to keep shoulder back with shoulder extensions. Increase weight tolerated with lat pull downs.    Stability/Clinical Decision Making  Evolving/Moderate complexity    Rehab Potential  Good    PT Frequency  2x / week    PT Treatment/Interventions  ADLs/Self Care Home Management;Cryotherapy;Electrical Stimulation;Therapeutic activities;Therapeutic exercise;Patient/family education;Manual techniques;Vasopneumatic Device    PT Next Visit Plan  Strength and ROM, MT as needed       Patient will benefit from skilled therapeutic intervention in order to improve the following deficits and impairments:  Pain, Postural dysfunction, Increased muscle spasms, Decreased scar mobility, Decreased activity tolerance, Decreased range of motion, Decreased strength, Impaired UE functional use, Impaired flexibility, Increased edema  Visit Diagnosis: Acute pain of right shoulder  Localized edema  Stiffness of right shoulder, not elsewhere classified     Problem List Patient Active Problem List   Diagnosis Date Noted  . S/P rotator cuff repair 03/18/2014    Scot Jun, PTA 10/26/2019, 8:44 AM  [place] [address] [place], [place], [address] Phone: [phone]   Fax:  [phone]  Name: Stacey Kelly MRN: [id number] Date of Birth: 09/18/1956

## 2019-10-29 ENCOUNTER — Encounter: Payer: Self-pay | Admitting: Obstetrics & Gynecology

## 2019-10-29 ENCOUNTER — Other Ambulatory Visit: Payer: Self-pay

## 2019-10-29 ENCOUNTER — Ambulatory Visit: Payer: PRIVATE HEALTH INSURANCE | Admitting: Physical Therapy

## 2019-10-29 ENCOUNTER — Encounter: Payer: Self-pay | Admitting: Physical Therapy

## 2019-10-29 DIAGNOSIS — M25611 Stiffness of right shoulder, not elsewhere classified: Secondary | ICD-10-CM | POA: Diagnosis not present

## 2019-10-29 DIAGNOSIS — Z1231 Encounter for screening mammogram for malignant neoplasm of breast: Secondary | ICD-10-CM | POA: Diagnosis not present

## 2019-10-29 DIAGNOSIS — M25511 Pain in right shoulder: Secondary | ICD-10-CM

## 2019-10-29 DIAGNOSIS — R600 Localized edema: Secondary | ICD-10-CM

## 2019-10-29 NOTE — Therapy (Signed)
[place], [place], [address] Phone: [phone]   Fax:  [phone]  Physical Therapy Treatment  Patient Details  Name: Stacey Kelly MRN: [id number] Date of Birth: 10-Dec-1955 Referring Provider (PT): RA Theda Sers   Encounter Date: 10/29/2019  PT End of Session - 10/29/19 0845    Visit Number  35    Authorization Type  W/C    PT Start Time  0808    PT Stop Time  0845    PT Time Calculation (min)  37 min    Activity Tolerance  Patient tolerated treatment well    Behavior During Therapy  [facility] for tasks assessed/performed       Past Medical History:  Diagnosis Date  . Depression   . Heart murmur    ASYMPTOMATIC--  ECHO  1995  MILD REGURG  . Migraine   . OA (osteoarthritis)    shoulders  . PONV (postoperative nausea and vomiting)   . Rotator cuff tear, right   . Vitamin D deficiency   . Wears glasses     Past Surgical History:  Procedure Laterality Date  . BUNIONECTOMY Right 2004   BOTH SIDES OF FOOT  . CRYOTHERAPY    . HARDWARE REMOVAL Right 04/15/2014   Procedure: EXCISION OF RIGHT FOOT DORSAL HARDWARE;  Surgeon: Wylene Simmer, MD;  Location: [place];  Service: Orthopedics;  Laterality: Right;  . RIGHT SHOULDER SURGERY  X3  LAST ONE 2010   INCLUDING ROTATOR CUFF REPAIR /  RECONSTRUCTION  . SHOULDER ARTHROSCOPY WITH ROTATOR CUFF REPAIR Left 03/18/2014   Procedure: LEFT SHOULDER ARTHROSCOPY WITH EVALUATION UNDER ANESTHESIA DEBRIDEMENT SUBACROMIAL DECOMPRESSION DISTAL CLAVICAL RESECTION ROTATOR CUFF REPAIR BICEPS  TENDINOSIS;  Surgeon: Sydnee Cabal, MD;  Location: [place];  Service: Orthopedics;  Laterality: Left;  . SHOULDER ARTHROSCOPY WITH ROTATOR CUFF REPAIR Right 06/23/2019   Procedure: SHOULDER ARTHROSCOPY WITH ROTATOR CUFF REPAIR , biceps tenotomy;  Surgeon: Sydnee Cabal, MD;  Location: [facility];  Service: Orthopedics;   Laterality: Right;  . TONSILLECTOMY  AGE 63    There were no vitals filed for this visit.  Subjective Assessment - 10/29/19 0809    Subjective  "Just really stiff, like a 1, you just know it is there"    Currently in Pain?  Yes    Pain Score  1     Pain Location  Shoulder    Pain Orientation  Right                       [facility] Adult PT Treatment/Exercise - 10/29/19 0001      Shoulder Exercises: Supine   Other Supine Exercises  Internal & External rotation 2lb 2x10      Shoulder Exercises: Standing   External Rotation  Right;10 reps;Theraband    Theraband Level (Shoulder External Rotation)  Level 2 (Red)    Internal Rotation  Strengthening;Right;20 reps;Theraband    Theraband Level (Shoulder Internal Rotation)  Level 3 (Green)    Flexion  20 reps;Both;Strengthening;Theraband    Shoulder Flexion Weight (lbs)  1    Extension  Strengthening;Both;Weights;20 reps    Extension Weight (lbs)  10    Other Standing Exercises  RUE D2 flex 2x10, 3 level cobinet reaches flex and abd x 10 each       Shoulder Exercises: ROM/Strengthening   UBE (Upper Arm Bike)  Constant work 25 watts 3 min each way  Other ROM/Strengthening Exercises  Rows 35lb 2x15, Lats 35lb 2x10      Manual Therapy   Manual Therapy  Passive ROM;Soft tissue mobilization    Passive ROM  R GH joint in all directions, main focus on Intermnal rotation               PT Short Term Goals - 07/03/19 1024      PT SHORT TERM GOAL #1   Title  independent with initial HEP    Status  Achieved        PT Long Term Goals - 10/26/19 0813      PT LONG TERM GOAL #2   Title  AROM of the right shoulder to WFL's    Status  Partially Met      PT LONG TERM GOAL #3   Title  report no difficulty with dressing and doing hair    Status  Partially Met      PT LONG TERM GOAL #4   Title  sleep without pain down the arm    Status  Achieved      PT LONG TERM GOAL #5   Title  lift 5# overhead with right arm     Status  On-going            Plan - 10/29/19 0848    Clinical Impression Statement  Pt is doing well overall but the R shoulder remains very weak. Weakness present when R arm is away from the body. She has good strength with closed chain interventions. PROM is good with some internal rotation tightness .    Stability/Clinical Decision Making  Evolving/Moderate complexity    Rehab Potential  Good    PT Frequency  2x / week    PT Duration  8 weeks    PT Treatment/Interventions  ADLs/Self Care Home Management;Cryotherapy;Electrical Stimulation;Therapeutic activities;Therapeutic exercise;Patient/family education;Manual techniques;Vasopneumatic Device    PT Next Visit Plan  Strength and ROM, MT as needed       Patient will benefit from skilled therapeutic intervention in order to improve the following deficits and impairments:  Pain, Postural dysfunction, Increased muscle spasms, Decreased scar mobility, Decreased activity tolerance, Decreased range of motion, Decreased strength, Impaired UE functional use, Impaired flexibility, Increased edema  Visit Diagnosis: Acute pain of right shoulder  Localized edema  Stiffness of right shoulder, not elsewhere classified     Problem List Patient Active Problem List   Diagnosis Date Noted  . S/P rotator cuff repair 03/18/2014    Scot Jun, PTA 10/29/2019, 8:52 AM  [place] [address] [place], [place], [address] Phone: [phone]   Fax:  [phone]  Name: Stacey Kelly MRN: [id number] Date of Birth: Sep 02, 1956

## 2019-11-02 ENCOUNTER — Other Ambulatory Visit: Payer: Self-pay

## 2019-11-02 ENCOUNTER — Ambulatory Visit: Payer: PRIVATE HEALTH INSURANCE | Attending: Specialist | Admitting: Physical Therapy

## 2019-11-02 ENCOUNTER — Encounter: Payer: Self-pay | Admitting: Physical Therapy

## 2019-11-02 DIAGNOSIS — M25511 Pain in right shoulder: Secondary | ICD-10-CM | POA: Insufficient documentation

## 2019-11-02 DIAGNOSIS — M25611 Stiffness of right shoulder, not elsewhere classified: Secondary | ICD-10-CM | POA: Diagnosis present

## 2019-11-02 DIAGNOSIS — R600 Localized edema: Secondary | ICD-10-CM | POA: Diagnosis not present

## 2019-11-02 DIAGNOSIS — M8588 Other specified disorders of bone density and structure, other site: Secondary | ICD-10-CM | POA: Diagnosis not present

## 2019-11-02 NOTE — Therapy (Signed)
[place], [place], [address] Phone: [phone]   Fax:  [phone]  Physical Therapy Treatment  Patient Details  Name: Stacey Kelly MRN: [id number] Date of Birth: November 18, 1955 Referring Provider (PT): RA Theda Sers   Encounter Date: 11/02/2019  PT End of Session - 11/02/19 0843    Visit Number  36    Authorization Type  W/C    PT Start Time  0813    PT Stop Time  0845    PT Time Calculation (min)  32 min    Activity Tolerance  Patient tolerated treatment well    Behavior During Therapy  [facility] for tasks assessed/performed       Past Medical History:  Diagnosis Date  . Depression   . Heart murmur    ASYMPTOMATIC--  ECHO  1995  MILD REGURG  . Migraine   . OA (osteoarthritis)    shoulders  . PONV (postoperative nausea and vomiting)   . Rotator cuff tear, right   . Vitamin D deficiency   . Wears glasses     Past Surgical History:  Procedure Laterality Date  . BUNIONECTOMY Right 2004   BOTH SIDES OF FOOT  . CRYOTHERAPY    . HARDWARE REMOVAL Right 04/15/2014   Procedure: EXCISION OF RIGHT FOOT DORSAL HARDWARE;  Surgeon: Wylene Simmer, MD;  Location: [place];  Service: Orthopedics;  Laterality: Right;  . RIGHT SHOULDER SURGERY  X3  LAST ONE 2010   INCLUDING ROTATOR CUFF REPAIR /  RECONSTRUCTION  . SHOULDER ARTHROSCOPY WITH ROTATOR CUFF REPAIR Left 03/18/2014   Procedure: LEFT SHOULDER ARTHROSCOPY WITH EVALUATION UNDER ANESTHESIA DEBRIDEMENT SUBACROMIAL DECOMPRESSION DISTAL CLAVICAL RESECTION ROTATOR CUFF REPAIR BICEPS  TENDINOSIS;  Surgeon: Sydnee Cabal, MD;  Location: [place];  Service: Orthopedics;  Laterality: Left;  . SHOULDER ARTHROSCOPY WITH ROTATOR CUFF REPAIR Right 06/23/2019   Procedure: SHOULDER ARTHROSCOPY WITH ROTATOR CUFF REPAIR , biceps tenotomy;  Surgeon: Sydnee Cabal, MD;  Location[facility];  Service: Orthopedics;  Laterality:  Right;  . TONSILLECTOMY  AGE 64    There were no vitals filed for this visit.  Subjective Assessment - 11/02/19 0815    Subjective  "I have been using my reach a lot" Pt reports doing some light overhead lifting "Box of mask" at work    Currently in Pain?  Yes    Pain Score  2                        [facility] Adult PT Treatment/Exercise - 11/02/19 0001      Shoulder Exercises: Seated   Other Seated Exercises  SA Row 35lb 2x10, Lats 45lb 3x5      Shoulder Exercises: Standing   External Rotation  Right;Theraband;20 reps    Theraband Level (Shoulder External Rotation)  Level 2 (Red)    Internal Rotation  Strengthening;Right;20 reps;Theraband    Theraband Level (Shoulder Internal Rotation)  Level 4 (Blue)    Extension  Strengthening;Both;Weights;20 reps    Extension Weight (lbs)  15    Other Standing Exercises  Flex/Abd combo 1lb 2x10    Other Standing Exercises  Back against wall shoulder flex overhead x10; Triceps ext 45lb 2x10, Biceps curls 7 2x15       Shoulder Exercises: ROM/Strengthening   UBE (Upper Arm Bike)  Constant work 25 watts 3 min each way  PT Short Term Goals - 07/03/19 1024      PT SHORT TERM GOAL #1   Title  independent with initial HEP    Status  Achieved        PT Long Term Goals - 10/26/19 6195      PT LONG TERM GOAL #2   Title  AROM of the right shoulder to WFL's    Status  Partially Met      PT LONG TERM GOAL #3   Title  report no difficulty with dressing and doing hair    Status  Partially Met      PT LONG TERM GOAL #4   Title  sleep without pain down the arm    Status  Achieved      PT LONG TERM GOAL #5   Title  lift 5# overhead with right arm    Status  On-going            Plan - 11/02/19 0843    Clinical Impression Statement  Pt ~ 13 minutes late today. Pt has some difficulty with flexion to overhead with back against wall eliminating the use of momentum. Tolerated more weight with lat pull  dawns and triceps extensions but with some struggle. Reports the occasional catch in her R shoulder with flexion going overhead    Stability/Clinical Decision Making  Evolving/Moderate complexity    Rehab Potential  Good    PT Frequency  2x / week    PT Duration  8 weeks    PT Treatment/Interventions  ADLs/Self Care Home Management;Cryotherapy;Electrical Stimulation;Therapeutic activities;Therapeutic exercise;Patient/family education;Manual techniques;Vasopneumatic Device    PT Next Visit Plan  Strength and ROM, MT as needed       Patient will benefit from skilled therapeutic intervention in order to improve the following deficits and impairments:  Pain, Postural dysfunction, Increased muscle spasms, Decreased scar mobility, Decreased activity tolerance, Decreased range of motion, Decreased strength, Impaired UE functional use, Impaired flexibility, Increased edema  Visit Diagnosis: Localized edema  Stiffness of right shoulder, not elsewhere classified  Acute pain of right shoulder     Problem List Patient Active Problem List   Diagnosis Date Noted  . S/P rotator cuff repair 03/18/2014    Scot Jun, PTA 11/02/2019, 8:46 AM  [place] [address] [place], [place], [address] Phone: [phone]   Fax:  [phone]  Name: Stacey Kelly MRN: [id number] Date of Birth: 1956-05-11

## 2019-11-04 ENCOUNTER — Encounter: Payer: Self-pay | Admitting: Gynecology

## 2019-11-05 ENCOUNTER — Other Ambulatory Visit: Payer: Self-pay

## 2019-11-05 ENCOUNTER — Ambulatory Visit: Payer: PRIVATE HEALTH INSURANCE | Attending: Specialist | Admitting: Physical Therapy

## 2019-11-05 ENCOUNTER — Encounter: Payer: Self-pay | Admitting: Physical Therapy

## 2019-11-05 DIAGNOSIS — M25511 Pain in right shoulder: Secondary | ICD-10-CM | POA: Insufficient documentation

## 2019-11-05 DIAGNOSIS — R600 Localized edema: Secondary | ICD-10-CM | POA: Insufficient documentation

## 2019-11-05 DIAGNOSIS — M25611 Stiffness of right shoulder, not elsewhere classified: Secondary | ICD-10-CM | POA: Diagnosis present

## 2019-11-05 NOTE — Therapy (Signed)
[place], [place], [address] Phone: [phone]   Fax:  [phone]  Physical Therapy Treatment  Patient Details  Name: Stacey Kelly MRN: [id number] Date of Birth: 1956/03/12 Referring Provider (PT): RA Theda Sers   Encounter Date: 11/05/2019  PT End of Session - 11/05/19 0845    Visit Number  37    Authorization Type  W/C    PT Start Time  0807    PT Stop Time  0845    PT Time Calculation (min)  38 min    Activity Tolerance  Patient tolerated treatment well    Behavior During Therapy  [facility] for tasks assessed/performed       Past Medical History:  Diagnosis Date  . Depression   . Heart murmur    ASYMPTOMATIC--  ECHO  1995  MILD REGURG  . Migraine   . OA (osteoarthritis)    shoulders  . PONV (postoperative nausea and vomiting)   . Rotator cuff tear, right   . Vitamin D deficiency   . Wears glasses     Past Surgical History:  Procedure Laterality Date  . BUNIONECTOMY Right 2004   BOTH SIDES OF FOOT  . CRYOTHERAPY    . HARDWARE REMOVAL Right 04/15/2014   Procedure: EXCISION OF RIGHT FOOT DORSAL HARDWARE;  Surgeon: Wylene Simmer, MD;  Location: [place];  Service: Orthopedics;  Laterality: Right;  . RIGHT SHOULDER SURGERY  X3  LAST ONE 2010   INCLUDING ROTATOR CUFF REPAIR /  RECONSTRUCTION  . SHOULDER ARTHROSCOPY WITH ROTATOR CUFF REPAIR Left 03/18/2014   Procedure: LEFT SHOULDER ARTHROSCOPY WITH EVALUATION UNDER ANESTHESIA DEBRIDEMENT SUBACROMIAL DECOMPRESSION DISTAL CLAVICAL RESECTION ROTATOR CUFF REPAIR BICEPS  TENDINOSIS;  Surgeon: Sydnee Cabal, MD;  Location: [place];  Service: Orthopedics;  Laterality: Left;  . SHOULDER ARTHROSCOPY WITH ROTATOR CUFF REPAIR Right 06/23/2019   Procedure: SHOULDER ARTHROSCOPY WITH ROTATOR CUFF REPAIR , biceps tenotomy;  Surgeon: Sydnee Cabal, MD;  Location: [facility];  Service: Orthopedics;  Laterality:  Right;  . TONSILLECTOMY  AGE 45    There were no vitals filed for this visit.  Subjective Assessment - 11/05/19 0808    Subjective  Been doing HEP, some fatigue with the exercises.    Currently in Pain?  Yes    Pain Score  2     Pain Location  Shoulder    Pain Orientation  Right                       [facility] Adult PT Treatment/Exercise - 11/05/19 0001      Shoulder Exercises: Standing   External Rotation  Right;Theraband;20 reps    Internal Rotation  Strengthening;Right;20 reps;Theraband    Internal Rotation Weight (lbs)  5    Flexion  20 reps;Both;Strengthening;Theraband    Shoulder Flexion Weight (lbs)  1    Extension  Strengthening;Weights;15 reps;Both   x2   Extension Weight (lbs)  15    Other Standing Exercises  Archers Row 5lb 2x10     Other Standing Exercises  Back against wall shoulder flex overhead & yeller ER  2x10; Triceps ext 45lb 2x10, Biceps curls 7 2x15, D2 flex RUE x10 1lb x1lb some assist       Shoulder Exercises: ROM/Strengthening   UBE (Upper Arm Bike)  L5 x 3 min each way    Other ROM/Strengthening Exercises  Rows 45lb 2x10, Lats 35lb 2x10  PT Short Term Goals - 07/03/19 1024      PT SHORT TERM GOAL #1   Title  independent with initial HEP    Status  Achieved        PT Long Term Goals - 11/05/19 0830      PT LONG TERM GOAL #2   Title  AROM of the right shoulder to WFL's    Status  Partially Met      PT LONG TERM GOAL #3   Title  report no difficulty with dressing and doing hair    Status  Partially Met      PT LONG TERM GOAL #4   Title  sleep without pain down the arm    Status  Achieved      PT LONG TERM GOAL #5   Title  lift 5# overhead with right arm    Status  On-going            Plan - 11/05/19 0845    Clinical Impression Statement  Pt ~ 7 minutes late for today's treatment. Weakness with movement away from body remains. She did well with the new archers row but did fatigue quick. She did  well keeping her shoulder down with active flexion using 1lb weight.    Stability/Clinical Decision Making  Evolving/Moderate complexity    Rehab Potential  Good    PT Frequency  2x / week    PT Duration  8 weeks    PT Treatment/Interventions  ADLs/Self Care Home Management;Cryotherapy;Electrical Stimulation;Therapeutic activities;Therapeutic exercise;Patient/family education;Manual techniques;Vasopneumatic Device    PT Next Visit Plan  Strength and ROM, MT as needed       Patient will benefit from skilled therapeutic intervention in order to improve the following deficits and impairments:  Pain, Postural dysfunction, Increased muscle spasms, Decreased scar mobility, Decreased activity tolerance, Decreased range of motion, Decreased strength, Impaired UE functional use, Impaired flexibility, Increased edema  Visit Diagnosis: Stiffness of right shoulder, not elsewhere classified  Acute pain of right shoulder  Localized edema     Problem List Patient Active Problem List   Diagnosis Date Noted  . S/P rotator cuff repair 03/18/2014    Scot Jun, PTA 11/05/2019, 8:50 AM  [place] [address] [place], [place], [address] Phone: [phone]   Fax:  [phone]  Name: Stacey Kelly MRN: [id number] Date of Birth: 1956-10-06

## 2019-11-09 ENCOUNTER — Other Ambulatory Visit: Payer: Self-pay

## 2019-11-09 ENCOUNTER — Ambulatory Visit: Payer: PRIVATE HEALTH INSURANCE | Admitting: Physical Therapy

## 2019-11-09 ENCOUNTER — Encounter: Payer: Self-pay | Admitting: Physical Therapy

## 2019-11-09 DIAGNOSIS — M25611 Stiffness of right shoulder, not elsewhere classified: Secondary | ICD-10-CM | POA: Diagnosis not present

## 2019-11-09 DIAGNOSIS — M25511 Pain in right shoulder: Secondary | ICD-10-CM

## 2019-11-09 NOTE — Therapy (Signed)
[place], [place], [address] Phone: [phone]   Fax:  [phone]  Physical Therapy Treatment  Patient Details  Name: Stacey Kelly MRN: [id number] Date of Birth: [birth year] Referring Provider (PT): RA Theda Sers   Encounter Date: 11/09/2019  PT End of Session - 11/09/19 0834    Visit Number  2    Authorization Type  W/C    PT Start Time  0800    PT Stop Time  0844    PT Time Calculation (min)  44 min    Activity Tolerance  Patient tolerated treatment well    Behavior During Therapy  [facility] for tasks assessed/performed       Past Medical History:  Diagnosis Date  . Depression   . Heart murmur    ASYMPTOMATIC--  ECHO  1995  MILD REGURG  . Migraine   . OA (osteoarthritis)    shoulders  . PONV (postoperative nausea and vomiting)   . Rotator cuff tear, right   . Vitamin D deficiency   . Wears glasses     Past Surgical History:  Procedure Laterality Date  . BUNIONECTOMY Right 2004   BOTH SIDES OF FOOT  . CRYOTHERAPY    . HARDWARE REMOVAL Right 04/15/2014   Procedure: EXCISION OF RIGHT FOOT DORSAL HARDWARE;  Surgeon: Wylene Simmer, MD;  Location: [place];  Service: Orthopedics;  Laterality: Right;  . RIGHT SHOULDER SURGERY  X3  LAST ONE 2010   INCLUDING ROTATOR CUFF REPAIR /  RECONSTRUCTION  . SHOULDER ARTHROSCOPY WITH ROTATOR CUFF REPAIR Left 03/18/2014   Procedure: LEFT SHOULDER ARTHROSCOPY WITH EVALUATION UNDER ANESTHESIA DEBRIDEMENT SUBACROMIAL DECOMPRESSION DISTAL CLAVICAL RESECTION ROTATOR CUFF REPAIR BICEPS  TENDINOSIS;  Surgeon: Sydnee Cabal, MD;  Location: [place];  Service: Orthopedics;  Laterality: Left;  . SHOULDER ARTHROSCOPY WITH ROTATOR CUFF REPAIR Right 06/23/2019   Procedure: SHOULDER ARTHROSCOPY WITH ROTATOR CUFF REPAIR , biceps tenotomy;  Surgeon: Sydnee Cabal, MD;  Location: [facility];  Service: Orthopedics;   Laterality: Right;  . TONSILLECTOMY  AGE 6    There were no vitals filed for this visit.  Subjective Assessment - 11/09/19 0809    Subjective  Patient reports that she is doing okay, she reports difficulty at times with reaching up and out, "tightness and weakness"  "can't put up dishes on the top shelf"    Currently in Pain?  Yes    Pain Score  2     Pain Location  Shoulder    Pain Orientation  Right    Pain Descriptors / Indicators  Sore;Tightness    Aggravating Factors   reaching up and out         [facility] PT Assessment - 11/09/19 0001      ROM / Strength   AROM / PROM / Strength  Strength      AROM   Overall AROM Comments  Reaching away from the body she does some compensatory patterns and tends to have a weak area at about 90 degrees when away from the body    Right Shoulder Flexion  170 Degrees    Right Shoulder ABduction  162 Degrees    Right Shoulder Internal Rotation  55 Degrees    Right Shoulder External Rotation  90 Degrees      Strength   Strength Assessment Site  Shoulder    Right/Left Shoulder  Right    Right Shoulder Flexion  3/5  Right Shoulder ABduction  3/5    Right Shoulder Internal Rotation  4/5    Right Shoulder External Rotation  3+/5                   [facility] Adult PT Treatment/Exercise - 11/09/19 0001      Shoulder Exercises: Supine   Other Supine Exercises  serratus punches x15  3#, 3# isometric circles      Shoulder Exercises: Prone   Retraction  Right;Weights;20 reps    Retraction Weight (lbs)  4    Flexion  20 reps;Right;AROM    Extension  Right;20 reps;Weights    Extension Weight (lbs)  3    Horizontal ABduction 1  Right;20 reps;Weights    Horizontal ABduction 1 Weight (lbs)  1    Horizontal ABduction 2  Right;20 reps;Weights    Horizontal ABduction 2 Weight (lbs)  2      Shoulder Exercises: ROM/Strengthening   Nustep  UE only L4 x4 min     Wall Wash  all motions focus on the upper ROM, some eccentric work to get the top  ROM, used 2#               PT Short Term Goals - 07/03/19 1024      PT SHORT TERM GOAL #1   Title  independent with initial HEP    Status  Achieved        PT Long Term Goals - 11/09/19 0837      PT LONG TERM GOAL #1   Title  decrease pain 50%    Status  Achieved      PT LONG TERM GOAL #2   Title  AROM of the right shoulder to WFL's    Status  Partially Met      PT LONG TERM GOAL #3   Title  report no difficulty with dressing and doing hair    Status  Partially Met      PT LONG TERM GOAL #4   Title  sleep without pain down the arm    Status  Achieved      PT LONG TERM GOAL #5   Title  lift 5# overhead with right arm    Status  On-going            Plan - 11/09/19 0835    Clinical Impression Statement  Patient has improved her ROM to close to functional levels, her issues is strength away from the body, she tends to use the upper trap to compensate, this does cause some upper trap and teres tightness and pain.  Her strength really is limited when she is at 90 degrees flexion and abduction, her only limited motions is with IR which is to 55 degrees.    Pain is much less overall but still has a catch at times reaching out    PT Next Visit Plan  I will defer to MD in regards to the continuation of PT    Consulted and Agree with Plan of Care  Patient       Patient will benefit from skilled therapeutic intervention in order to improve the following deficits and impairments:  Pain, Postural dysfunction, Increased muscle spasms, Decreased scar mobility, Decreased activity tolerance, Decreased range of motion, Decreased strength, Impaired UE functional use, Impaired flexibility, Increased edema  Visit Diagnosis: Stiffness of right shoulder, not elsewhere classified  Acute pain of right shoulder     Problem List Patient Active Problem List   Diagnosis Date  Noted  . S/P rotator cuff repair 03/18/2014    Sumner Boast., PT 11/09/2019, 8:38 AM  [place] [address] [place], [place], [address] Phone: [phone]   Fax:  [phone]  Name: Stacey Kelly MRN: [id number] Date of Birth: 62-09-1956

## 2019-11-13 ENCOUNTER — Other Ambulatory Visit: Payer: Self-pay

## 2019-11-13 ENCOUNTER — Ambulatory Visit: Payer: PRIVATE HEALTH INSURANCE | Attending: Specialist | Admitting: Physical Therapy

## 2019-11-13 ENCOUNTER — Encounter: Payer: Self-pay | Admitting: Physical Therapy

## 2019-11-13 DIAGNOSIS — R600 Localized edema: Secondary | ICD-10-CM | POA: Insufficient documentation

## 2019-11-13 DIAGNOSIS — M25611 Stiffness of right shoulder, not elsewhere classified: Secondary | ICD-10-CM | POA: Diagnosis present

## 2019-11-13 DIAGNOSIS — M25511 Pain in right shoulder: Secondary | ICD-10-CM | POA: Insufficient documentation

## 2019-11-13 NOTE — Therapy (Signed)
[place], [place], [address] Phone: [phone]   Fax:  [phone]  Physical Therapy Treatment  Patient Details  Name: Stacey Kelly MRN: [id number] Date of Birth: 1956/10/22 Referring Provider (PT): RA Theda Sers   Encounter Date: 11/13/2019  PT End of Session - 11/13/19 0845    Visit Number  39    Number of Visits  15    Authorization Type  W/C    PT Start Time  0803    PT Stop Time  0845    PT Time Calculation (min)  42 min    Activity Tolerance  Patient tolerated treatment well    Behavior During Therapy  [facility] for tasks assessed/performed       Past Medical History:  Diagnosis Date  . Depression   . Heart murmur    ASYMPTOMATIC--  ECHO  1995  MILD REGURG  . Migraine   . OA (osteoarthritis)    shoulders  . PONV (postoperative nausea and vomiting)   . Rotator cuff tear, right   . Vitamin D deficiency   . Wears glasses     Past Surgical History:  Procedure Laterality Date  . BUNIONECTOMY Right 2004   BOTH SIDES OF FOOT  . CRYOTHERAPY    . HARDWARE REMOVAL Right 04/15/2014   Procedure: EXCISION OF RIGHT FOOT DORSAL HARDWARE;  Surgeon: Wylene Simmer, MD;  Location: [place];  Service: Orthopedics;  Laterality: Right;  . RIGHT SHOULDER SURGERY  X3  LAST ONE 2010   INCLUDING ROTATOR CUFF REPAIR /  RECONSTRUCTION  . SHOULDER ARTHROSCOPY WITH ROTATOR CUFF REPAIR Left 03/18/2014   Procedure: LEFT SHOULDER ARTHROSCOPY WITH EVALUATION UNDER ANESTHESIA DEBRIDEMENT SUBACROMIAL DECOMPRESSION DISTAL CLAVICAL RESECTION ROTATOR CUFF REPAIR BICEPS  TENDINOSIS;  Surgeon: Sydnee Cabal, MD;  Location: [place];  Service: Orthopedics;  Laterality: Left;  . SHOULDER ARTHROSCOPY WITH ROTATOR CUFF REPAIR Right 06/23/2019   Procedure: SHOULDER ARTHROSCOPY WITH ROTATOR CUFF REPAIR , biceps tenotomy;  Surgeon: Sydnee Cabal, MD;  Location: [facility];  Service:  Orthopedics;  Laterality: Right;  . TONSILLECTOMY  AGE 50    There were no vitals filed for this visit.  Subjective Assessment - 11/13/19 0804    Subjective  had some flu like symptoms from second covid shot. Was in bed for two days. Today she is just stiff.    Currently in Pain?  Yes    Pain Score  1     Pain Location  Shoulder                       [facility] Adult PT Treatment/Exercise - 11/13/19 0001      Shoulder Exercises: Standing   Flexion  20 reps;Both;Strengthening;Theraband    Shoulder Flexion Weight (lbs)  1    ABduction  Both;Strengthening;Weights;20 reps    Shoulder ABduction Weight (lbs)  1    Extension  Strengthening;Weights;Both;20 reps    Extension Weight (lbs)  15    Other Standing Exercises  1lb ER abd to 90 2x10; D2 est 5llb 2x10     Other Standing Exercises  2-3 level cabinet reaches 1lb 2x10 flex & abd       Shoulder Exercises: ROM/Strengthening   UBE (Upper Arm Bike)  L5 x 3 min each way    Wall Wash  all motions focus on the upper ROM, some eccentric work to get the top ROM, used 2#  Other ROM/Strengthening Exercises  Rows 45lb 2x10, Lats 35lb 2x10    Other ROM/Strengthening Exercises  Triceps ext 35lb 2x15                PT Short Term Goals - 07/03/19 1024      PT SHORT TERM GOAL #1   Title  independent with initial HEP    Status  Achieved        PT Long Term Goals - 11/09/19 0837      PT LONG TERM GOAL #1   Title  decrease pain 50%    Status  Achieved      PT LONG TERM GOAL #2   Title  AROM of the right shoulder to WFL's    Status  Partially Met      PT LONG TERM GOAL #3   Title  report no difficulty with dressing and doing hair    Status  Partially Met      PT LONG TERM GOAL #4   Title  sleep without pain down the arm    Status  Achieved      PT LONG TERM GOAL #5   Title  lift 5# overhead with right arm    Status  On-going            Plan - 11/13/19 0845    Clinical Impression Statement  Weakness  remains with motions away from body, no reports of pain just tightness. R shoulder fatigues quick with external rotation. Cues needed to keep elbow in with flexion motions. Assist needed with to reach the upper level with cabinet reaches.    Stability/Clinical Decision Making  Evolving/Moderate complexity    Rehab Potential  Good    PT Frequency  2x / week    PT Duration  8 weeks    PT Treatment/Interventions  ADLs/Self Care Home Management;Cryotherapy;Electrical Stimulation;Therapeutic activities;Therapeutic exercise;Patient/family education;Manual techniques;Vasopneumatic Device    PT Next Visit Plan  I will defer to MD in regards to the continuation of PT       Patient will benefit from skilled therapeutic intervention in order to improve the following deficits and impairments:  Pain, Postural dysfunction, Increased muscle spasms, Decreased scar mobility, Decreased activity tolerance, Decreased range of motion, Decreased strength, Impaired UE functional use, Impaired flexibility, Increased edema  Visit Diagnosis: Localized edema  Acute pain of right shoulder  Stiffness of right shoulder, not elsewhere classified     Problem List Patient Active Problem List   Diagnosis Date Noted  . S/P rotator cuff repair 03/18/2014    Scot Jun, PTA 11/13/2019, 8:52 AM  [place] [address] [place], [place], [address] Phone: [phone]   Fax:  [phone]  Name: Stacey Kelly MRN: [id number] Date of Birth: 1956/07/01

## 2019-11-16 ENCOUNTER — Encounter: Payer: Self-pay | Admitting: Physical Therapy

## 2019-11-16 ENCOUNTER — Ambulatory Visit: Payer: PRIVATE HEALTH INSURANCE | Attending: Specialist | Admitting: Physical Therapy

## 2019-11-16 ENCOUNTER — Other Ambulatory Visit: Payer: Self-pay

## 2019-11-16 DIAGNOSIS — R600 Localized edema: Secondary | ICD-10-CM | POA: Diagnosis present

## 2019-11-16 DIAGNOSIS — M25511 Pain in right shoulder: Secondary | ICD-10-CM | POA: Diagnosis not present

## 2019-11-16 DIAGNOSIS — M25611 Stiffness of right shoulder, not elsewhere classified: Secondary | ICD-10-CM | POA: Diagnosis present

## 2019-11-16 NOTE — Therapy (Signed)
[place], [place], [address] Phone: [phone]   Fax:  [phone]  Physical Therapy Treatment  Patient Details  Name: Stacey Kelly MRN: [id number] Date of Birth: Feb 23, 1956 Referring Provider (PT): RA Theda Sers   Encounter Date: 11/16/2019  PT End of Session - 11/16/19 1509    Visit Number  40    PT Start Time  1430    PT Stop Time  1510    PT Time Calculation (min)  40 min    Activity Tolerance  Patient tolerated treatment well    Behavior During Therapy  [facility] for tasks assessed/performed       Past Medical History:  Diagnosis Date  . Depression   . Heart murmur    ASYMPTOMATIC--  ECHO  1995  MILD REGURG  . Migraine   . OA (osteoarthritis)    shoulders  . PONV (postoperative nausea and vomiting)   . Rotator cuff tear, right   . Vitamin D deficiency   . Wears glasses     Past Surgical History:  Procedure Laterality Date  . BUNIONECTOMY Right 2004   BOTH SIDES OF FOOT  . CRYOTHERAPY    . HARDWARE REMOVAL Right 04/15/2014   Procedure: EXCISION OF RIGHT FOOT DORSAL HARDWARE;  Surgeon: Wylene Simmer, MD;  Location: [place];  Service: Orthopedics;  Laterality: Right;  . RIGHT SHOULDER SURGERY  X3  LAST ONE 2010   INCLUDING ROTATOR CUFF REPAIR /  RECONSTRUCTION  . SHOULDER ARTHROSCOPY WITH ROTATOR CUFF REPAIR Left 03/18/2014   Procedure: LEFT SHOULDER ARTHROSCOPY WITH EVALUATION UNDER ANESTHESIA DEBRIDEMENT SUBACROMIAL DECOMPRESSION DISTAL CLAVICAL RESECTION ROTATOR CUFF REPAIR BICEPS  TENDINOSIS;  Surgeon: Sydnee Cabal, MD;  Location: [place];  Service: Orthopedics;  Laterality: Left;  . SHOULDER ARTHROSCOPY WITH ROTATOR CUFF REPAIR Right 06/23/2019   Procedure: SHOULDER ARTHROSCOPY WITH ROTATOR CUFF REPAIR , biceps tenotomy;  Surgeon: Sydnee Cabal, MD;  Location: [facility];  Service: Orthopedics;  Laterality: Right;  . TONSILLECTOMY   AGE 64    There were no vitals filed for this visit.  Subjective Assessment - 11/16/19 1439    Subjective  "The arm is just tired"    Currently in Pain?  Yes    Pain Score  1     Pain Location  Shoulder                       [facility] Adult PT Treatment/Exercise - 11/16/19 0001      Shoulder Exercises: Standing   Other Standing Exercises  1lb ER abd to 90 2x10; D2 est 5llb 2x10     Other Standing Exercises  OHP back agains wall 1lb 2x10      Shoulder Exercises: ROM/Strengthening   UBE (Upper Arm Bike)  L5 x 3 min each way    Wall Wash  all motions focus on the upper ROM, some eccentric work to get the top ROM, used 2#    Other ROM/Strengthening Exercises  Rows 45lb 2x10, Lats 35lb 2x10; Chest press 10lb 2x10     Other ROM/Strengthening Exercises  Triceps ext 35lb 2x15                PT Short Term Goals - 07/03/19 1024      PT SHORT TERM GOAL #1   Title  independent with initial HEP    Status  Achieved        PT  Long Term Goals - 11/09/19 0837      PT LONG TERM GOAL #1   Title  decrease pain 50%    Status  Achieved      PT LONG TERM GOAL #2   Title  AROM of the right shoulder to WFL's    Status  Partially Met      PT LONG TERM GOAL #3   Title  report no difficulty with dressing and doing hair    Status  Partially Met      PT LONG TERM GOAL #4   Title  sleep without pain down the arm    Status  Achieved      PT LONG TERM GOAL #5   Title  lift 5# overhead with right arm    Status  On-going            Plan - 11/16/19 1510    Clinical Impression Statement  Pt reports increase fatigue today from helping her daughter move. She did express that's he followed all the proper precautions with her shoulder. Good strength and ROM with all closed chain exercises. Open chain interventions still exposes some weakness with movement away from body and over head. Assist needed with standing OHP.    Stability/Clinical Decision Making   Evolving/Moderate complexity    Rehab Potential  Good    PT Duration  8 weeks    PT Treatment/Interventions  ADLs/Self Care Home Management;Cryotherapy;Electrical Stimulation;Therapeutic activities;Therapeutic exercise;Patient/family education;Manual techniques;Vasopneumatic Device    PT Next Visit Plan  I will defer to MD in regards to the continuation of PT       Patient will benefit from skilled therapeutic intervention in order to improve the following deficits and impairments:  Pain, Postural dysfunction, Increased muscle spasms, Decreased scar mobility, Decreased activity tolerance, Decreased range of motion, Decreased strength, Impaired UE functional use, Impaired flexibility, Increased edema  Visit Diagnosis: Acute pain of right shoulder  Stiffness of right shoulder, not elsewhere classified  Localized edema     Problem List Patient Active Problem List   Diagnosis Date Noted  . S/P rotator cuff repair 03/18/2014    Scot Jun, PTA 11/16/2019, 3:20 PM  [place], [place], [address] Phone: [phone]   Fax:  [phone]  Name: Stacey Kelly MRN: [id number] Date of Birth: 26-May-1956

## 2019-11-20 ENCOUNTER — Other Ambulatory Visit: Payer: Self-pay

## 2019-11-20 ENCOUNTER — Ambulatory Visit: Payer: PRIVATE HEALTH INSURANCE | Attending: Specialist | Admitting: Physical Therapy

## 2019-11-20 ENCOUNTER — Encounter: Payer: Self-pay | Admitting: Physical Therapy

## 2019-11-20 DIAGNOSIS — R600 Localized edema: Secondary | ICD-10-CM | POA: Diagnosis present

## 2019-11-20 DIAGNOSIS — M25511 Pain in right shoulder: Secondary | ICD-10-CM | POA: Insufficient documentation

## 2019-11-20 DIAGNOSIS — M25611 Stiffness of right shoulder, not elsewhere classified: Secondary | ICD-10-CM | POA: Insufficient documentation

## 2019-11-20 NOTE — Therapy (Signed)
[place], [place], [address] Phone: [phone]   Fax:  [phone]  Physical Therapy Treatment  Patient Details  Name: Stacey Kelly MRN: [id number] Date of Birth: [birth year] Referring Provider (PT): RA Theda Sers   Encounter Date: 11/20/2019  PT End of Session - 11/20/19 0842    Visit Number  41    Authorization Type  W/C    PT Start Time  0802    PT Stop Time  0845    PT Time Calculation (min)  43 min    Activity Tolerance  Patient tolerated treatment well    Behavior During Therapy  [facility] for tasks assessed/performed       Past Medical History:  Diagnosis Date  . Depression   . Heart murmur    ASYMPTOMATIC--  ECHO  1995  MILD REGURG  . Migraine   . OA (osteoarthritis)    shoulders  . PONV (postoperative nausea and vomiting)   . Rotator cuff tear, right   . Vitamin D deficiency   . Wears glasses     Past Surgical History:  Procedure Laterality Date  . BUNIONECTOMY Right 2004   BOTH SIDES OF FOOT  . CRYOTHERAPY    . HARDWARE REMOVAL Right 04/15/2014   Procedure: EXCISION OF RIGHT FOOT DORSAL HARDWARE;  Surgeon: Wylene Simmer, MD;  Location: [place];  Service: Orthopedics;  Laterality: Right;  . RIGHT SHOULDER SURGERY  X3  LAST ONE 2010   INCLUDING ROTATOR CUFF REPAIR /  RECONSTRUCTION  . SHOULDER ARTHROSCOPY WITH ROTATOR CUFF REPAIR Left 03/18/2014   Procedure: LEFT SHOULDER ARTHROSCOPY WITH EVALUATION UNDER ANESTHESIA DEBRIDEMENT SUBACROMIAL DECOMPRESSION DISTAL CLAVICAL RESECTION ROTATOR CUFF REPAIR BICEPS  TENDINOSIS;  Surgeon: Sydnee Cabal, MD;  Location: [place];  Service: Orthopedics;  Laterality: Left;  . SHOULDER ARTHROSCOPY WITH ROTATOR CUFF REPAIR Right 06/23/2019   Procedure: SHOULDER ARTHROSCOPY WITH ROTATOR CUFF REPAIR , biceps tenotomy;  Surgeon: Sydnee Cabal, MD;  Location: [facility];  Service: Orthopedics;   Laterality: Right;  . TONSILLECTOMY  AGE 64    There were no vitals filed for this visit.  Subjective Assessment - 11/20/19 0808    Subjective  "I am just tired"    Currently in Pain?  Yes    Pain Score  2     Pain Location  Shoulder    Pain Orientation  Right                       [facility] Adult PT Treatment/Exercise - 11/20/19 0001      Shoulder Exercises: Supine   Other Supine Exercises  ER/IR 2lb x 10 each    Other Supine Exercises  Iso flex pertubations      Shoulder Exercises: Standing   Extension  Strengthening;Weights;Both;20 reps    Extension Weight (lbs)  15    Other Standing Exercises  3 level cabinet reaches 1lb  flex x 10    Other Standing Exercises  Drop and catch flex 2x5, abd 2x5; OHP yellow ball x10, 2lb x10       Shoulder Exercises: ROM/Strengthening   UBE (Upper Arm Bike)  L5.5 x 3 min each way    Other ROM/Strengthening Exercises  Rows 45lb 2x10, Lats 35lb 2x10; Chest press 15lb 2x10                PT Short Term Goals - 07/03/19 1024  PT SHORT TERM GOAL #1   Title  independent with initial HEP    Status  Achieved        PT Long Term Goals - 11/09/19 0837      PT LONG TERM GOAL #1   Title  decrease pain 50%    Status  Achieved      PT LONG TERM GOAL #2   Title  AROM of the right shoulder to WFL's    Status  Partially Met      PT LONG TERM GOAL #3   Title  report no difficulty with dressing and doing hair    Status  Partially Met      PT LONG TERM GOAL #4   Title  sleep without pain down the arm    Status  Achieved      PT LONG TERM GOAL #5   Title  lift 5# overhead with right arm    Status  On-going            Plan - 11/20/19 0843    Clinical Impression Statement  Drop and catch was difficult for pt but she was able to complete with both flexion and abduction. During she reported a band like feeling in the anterior aspect of R shoulder. Good strength with closed chain pulling interventions. She is very  weak with open chain activities away from body    Stability/Clinical Decision Making  Evolving/Moderate complexity    PT Treatment/Interventions  ADLs/Self Care Home Management;Cryotherapy;Electrical Stimulation;Therapeutic activities;Therapeutic exercise;Patient/family education;Manual techniques;Vasopneumatic Device    PT Next Visit Plan  RUE strengthening away from body       Patient will benefit from skilled therapeutic intervention in order to improve the following deficits and impairments:  Pain, Postural dysfunction, Increased muscle spasms, Decreased scar mobility, Decreased activity tolerance, Decreased range of motion, Decreased strength, Impaired UE functional use, Impaired flexibility, Increased edema  Visit Diagnosis: Stiffness of right shoulder, not elsewhere classified  Acute pain of right shoulder  Localized edema     Problem List Patient Active Problem List   Diagnosis Date Noted  . S/P rotator cuff repair 03/18/2014    Scot Jun, PTA 11/20/2019, 8:45 AM  [address] [place], [place], [address] Phone: [phone]   Fax:  [phone]  Name: Stacey Kelly MRN: [id number] Date of Birth: 10-Aug-1956

## 2019-11-23 ENCOUNTER — Encounter: Payer: Self-pay | Admitting: Physical Therapy

## 2019-11-23 ENCOUNTER — Other Ambulatory Visit: Payer: Self-pay

## 2019-11-23 ENCOUNTER — Ambulatory Visit: Payer: PRIVATE HEALTH INSURANCE | Attending: Specialist | Admitting: Physical Therapy

## 2019-11-23 DIAGNOSIS — M25611 Stiffness of right shoulder, not elsewhere classified: Secondary | ICD-10-CM | POA: Diagnosis present

## 2019-11-23 DIAGNOSIS — R600 Localized edema: Secondary | ICD-10-CM | POA: Diagnosis not present

## 2019-11-23 DIAGNOSIS — M25511 Pain in right shoulder: Secondary | ICD-10-CM | POA: Diagnosis present

## 2019-11-23 NOTE — Therapy (Signed)
Stacey Kelly, [place], [address] Phone: [phone]   Fax:  [phone]  Physical Therapy Treatment  Patient Details  Name: Stacey Kelly MRN: [id number] Date of Birth: 03/04/1956 Referring Provider (PT): RA Theda Sers   Encounter Date: 11/23/2019  PT End of Session - 11/23/19 1008    Visit Number  42    Authorization Type  W/C    PT Start Time  0930    PT Stop Time  1010    PT Time Calculation (min)  40 min    Activity Tolerance  Patient tolerated treatment well       Past Medical History:  Diagnosis Date  . Depression   . Heart murmur    ASYMPTOMATIC--  ECHO  1995  MILD REGURG  . Migraine   . OA (osteoarthritis)    shoulders  . PONV (postoperative nausea and vomiting)   . Rotator cuff tear, right   . Vitamin D deficiency   . Wears glasses     Past Surgical History:  Procedure Laterality Date  . BUNIONECTOMY Right 2004   BOTH SIDES OF FOOT  . CRYOTHERAPY    . HARDWARE REMOVAL Right 04/15/2014   Procedure: EXCISION OF RIGHT FOOT DORSAL HARDWARE;  Surgeon: Wylene Simmer, MD;  Location: [facility];  Service: Orthopedics;  Laterality: Right;  . RIGHT SHOULDER SURGERY  X3  LAST ONE 2010   INCLUDING ROTATOR CUFF REPAIR /  RECONSTRUCTION  . SHOULDER ARTHROSCOPY WITH ROTATOR CUFF REPAIR Left 03/18/2014   Procedure: LEFT SHOULDER ARTHROSCOPY WITH EVALUATION UNDER ANESTHESIA DEBRIDEMENT SUBACROMIAL DECOMPRESSION DISTAL CLAVICAL RESECTION ROTATOR CUFF REPAIR BICEPS  TENDINOSIS;  Surgeon: Sydnee Cabal, MD;  Location: [facility];  Service: Orthopedics;  Laterality: Left;  . SHOULDER ARTHROSCOPY WITH ROTATOR CUFF REPAIR Right 06/23/2019   Procedure: SHOULDER ARTHROSCOPY WITH ROTATOR CUFF REPAIR , biceps tenotomy;  Surgeon: Sydnee Cabal, MD;  Location: [facility];  Service: Orthopedics;  Laterality: Right;  . TONSILLECTOMY  AGE 88    There were no vitals  filed for this visit.  Subjective Assessment - 11/23/19 0934    Subjective  "Doing good, really stiff with a 1"    Currently in Pain?  Yes    Pain Score  1     Pain Location  Shoulder    Pain Orientation  Right                       [facility] Adult PT Treatment/Exercise - 11/23/19 0001      Shoulder Exercises: Standing   Flexion  20 reps;Both;Strengthening;Theraband    Shoulder Flexion Weight (lbs)  2    Other Standing Exercises  ER with trunk rotation yellow x10  then x5    Other Standing Exercises  Drop and catch flex 2x10, abd 2x5; OHP 2llb 2x10       Shoulder Exercises: ROM/Strengthening   UBE (Upper Arm Bike)  L5.5 x 3 min each way    Other ROM/Strengthening Exercises  Rows 45lb 2x15, Lats 35lb 2x15; Chest press 15lb 2x10     Other ROM/Strengthening Exercises  Triceps ext 35lb 2x15                PT Short Term Goals - 07/03/19 1024      PT SHORT TERM GOAL #1   Title  independent with initial HEP    Status  Achieved  PT Long Term Goals - 11/09/19 0837      PT LONG TERM GOAL #1   Title  decrease pain 50%    Status  Achieved      PT LONG TERM GOAL #2   Title  AROM of the right shoulder to WFL's    Status  Partially Met      PT LONG TERM GOAL #3   Title  report no difficulty with dressing and doing hair    Status  Partially Met      PT LONG TERM GOAL #4   Title  sleep without pain down the arm    Status  Achieved      PT LONG TERM GOAL #5   Title  lift 5# overhead with right arm    Status  On-going            Plan - 11/23/19 1008    Clinical Impression Statement  Pt did a lot better with drop and catch interventions able to increase the reps. External rotation with trunk rotation was very difficult requiring assist. Assist also needed with OHP. Good strength with sated rows and lat pull downs. Weakness remains overall.    Stability/Clinical Decision Making  Evolving/Moderate complexity    Rehab Potential  Good    PT  Frequency  2x / week    PT Duration  8 weeks    PT Treatment/Interventions  ADLs/Self Care Home Management;Cryotherapy;Electrical Stimulation;Therapeutic activities;Therapeutic exercise;Patient/family education;Manual techniques;Vasopneumatic Device    PT Next Visit Plan  RUE strengthening away from body       Patient will benefit from skilled therapeutic intervention in order to improve the following deficits and impairments:  Pain, Postural dysfunction, Increased muscle spasms, Decreased scar mobility, Decreased activity tolerance, Decreased range of motion, Decreased strength, Impaired UE functional use, Impaired flexibility, Increased edema  Visit Diagnosis: Localized edema  Acute pain of right shoulder  Stiffness of right shoulder, not elsewhere classified     Problem List Patient Active Problem List   Diagnosis Date Noted  . S/P rotator cuff repair 03/18/2014    Scot Jun, PTA 11/23/2019, 10:12 AM  [place] [address] [place], [place], [address] Phone: [phone]   Fax:  [phone]  Name: Stacey Kelly MRN: [id number] Date of Birth: 01-Jun-1956

## 2019-11-27 ENCOUNTER — Ambulatory Visit: Payer: PRIVATE HEALTH INSURANCE | Attending: Specialist | Admitting: Physical Therapy

## 2019-11-27 ENCOUNTER — Other Ambulatory Visit: Payer: Self-pay

## 2019-11-27 ENCOUNTER — Encounter: Payer: Self-pay | Admitting: Physical Therapy

## 2019-11-27 DIAGNOSIS — M25511 Pain in right shoulder: Secondary | ICD-10-CM | POA: Diagnosis present

## 2019-11-27 DIAGNOSIS — R600 Localized edema: Secondary | ICD-10-CM | POA: Diagnosis present

## 2019-11-27 DIAGNOSIS — M25611 Stiffness of right shoulder, not elsewhere classified: Secondary | ICD-10-CM | POA: Diagnosis not present

## 2019-11-27 NOTE — Therapy (Signed)
[place], [place], [address] Phone: [phone]   Fax:  [phone]  Physical Therapy Treatment  Patient Details  Name: Stacey Kelly MRN: [id number] Date of Birth: [birth year] Referring Provider (PT): RA Theda Sers   Encounter Date: 11/27/2019  PT End of Session - 11/27/19 0922    Visit Number  83    Authorization Type  W/C    PT Start Time  0845    PT Stop Time  0922    PT Time Calculation (min)  37 min    Activity Tolerance  Patient tolerated treatment well    Behavior During Therapy  [facility] for tasks assessed/performed       Past Medical History:  Diagnosis Date  . Depression   . Heart murmur    ASYMPTOMATIC--  ECHO  1995  MILD REGURG  . Migraine   . OA (osteoarthritis)    shoulders  . PONV (postoperative nausea and vomiting)   . Rotator cuff tear, right   . Vitamin D deficiency   . Wears glasses     Past Surgical History:  Procedure Laterality Date  . BUNIONECTOMY Right 2004   BOTH SIDES OF FOOT  . CRYOTHERAPY    . HARDWARE REMOVAL Right 04/15/2014   Procedure: EXCISION OF RIGHT FOOT DORSAL HARDWARE;  Surgeon: Wylene Simmer, MD;  Location: [facility];  Service: Orthopedics;  Laterality: Right;  . RIGHT SHOULDER SURGERY  X3  LAST ONE 2010   INCLUDING ROTATOR CUFF REPAIR /  RECONSTRUCTION  . SHOULDER ARTHROSCOPY WITH ROTATOR CUFF REPAIR Left 03/18/2014   Procedure: LEFT SHOULDER ARTHROSCOPY WITH EVALUATION UNDER ANESTHESIA DEBRIDEMENT SUBACROMIAL DECOMPRESSION DISTAL CLAVICAL RESECTION ROTATOR CUFF REPAIR BICEPS  TENDINOSIS;  Surgeon: Sydnee Cabal, MD;  Location: [facility];  Service: Orthopedics;  Laterality: Left;  . SHOULDER ARTHROSCOPY WITH ROTATOR CUFF REPAIR Right 06/23/2019   Procedure: SHOULDER ARTHROSCOPY WITH ROTATOR CUFF REPAIR , biceps tenotomy;  Surgeon: Sydnee Cabal, MD;  Location: [facility];  Service: Orthopedics;   Laterality: Right;  . TONSILLECTOMY  AGE 64    There were no vitals filed for this visit.  Subjective Assessment - 11/27/19 0849    Subjective  "It went to golds yesterday" "A little clicking and popping "    Currently in Pain?  Yes    Pain Score  1     Pain Location  Shoulder    Pain Orientation  Right                       [facility] Adult PT Treatment/Exercise - 11/27/19 0001      Shoulder Exercises: Standing   Other Standing Exercises  RUE punches with downward pull yellow Tband 2x10     Other Standing Exercises  Drop and catch flex & abdgreen ball 2x5, weightless ball 2x10      Shoulder Exercises: ROM/Strengthening   UBE (Upper Arm Bike)  L5.5 x 2 min each way    Wall Wash  all motions focus on the upper ROM, some eccentric work to get the top ROM, used 1#    Other ROM/Strengthening Exercises  I's, Y' 1lb 2x10, T's no weight                PT Short Term Goals - 07/03/19 1024      PT SHORT TERM GOAL #1   Title  independent with initial HEP    Status  Achieved  PT Long Term Goals - 11/09/19 0837      PT LONG TERM GOAL #1   Title  decrease pain 50%    Status  Achieved      PT LONG TERM GOAL #2   Title  AROM of the right shoulder to WFL's    Status  Partially Met      PT LONG TERM GOAL #3   Title  report no difficulty with dressing and doing hair    Status  Partially Met      PT LONG TERM GOAL #4   Title  sleep without pain down the arm    Status  Achieved      PT LONG TERM GOAL #5   Title  lift 5# overhead with right arm    Status  On-going            Plan - 11/27/19 4665    Clinical Impression Statement  Pt did fair with a progressed session. She was able to complete all of the interventions Most of the interventions required pt to control RUE away from body. Difficulty noted thought session due to weakness. Added weight to drop and catch making it more difficult. Little ROM with T exercise facing wall. Some assist needed  during the lowering phase of wall wash.    Stability/Clinical Decision Making  Evolving/Moderate complexity    Rehab Potential  Good    PT Frequency  2x / week    PT Duration  8 weeks    PT Treatment/Interventions  ADLs/Self Care Home Management;Cryotherapy;Electrical Stimulation;Therapeutic activities;Therapeutic exercise;Patient/family education;Manual techniques;Vasopneumatic Device    PT Next Visit Plan  RUE strengthening away from body       Patient will benefit from skilled therapeutic intervention in order to improve the following deficits and impairments:  Pain, Postural dysfunction, Increased muscle spasms, Decreased scar mobility, Decreased activity tolerance, Decreased range of motion, Decreased strength, Impaired UE functional use, Impaired flexibility, Increased edema  Visit Diagnosis: Stiffness of right shoulder, not elsewhere classified  Acute pain of right shoulder  Localized edema     Problem List Patient Active Problem List   Diagnosis Date Noted  . S/P rotator cuff repair 03/18/2014    Scot Jun, PTA 11/27/2019, 9:26 AM  [place], [place], [address] Phone: [phone]   Fax:  [phone]  Name: Stacey Kelly MRN: [id number] Date of Birth: Aug 23, 1956

## 2019-11-30 ENCOUNTER — Other Ambulatory Visit: Payer: Self-pay

## 2019-11-30 ENCOUNTER — Ambulatory Visit: Payer: PRIVATE HEALTH INSURANCE | Attending: Specialist | Admitting: Physical Therapy

## 2019-11-30 ENCOUNTER — Encounter: Payer: Self-pay | Admitting: Physical Therapy

## 2019-11-30 DIAGNOSIS — M25511 Pain in right shoulder: Secondary | ICD-10-CM | POA: Insufficient documentation

## 2019-11-30 DIAGNOSIS — R600 Localized edema: Secondary | ICD-10-CM | POA: Diagnosis present

## 2019-11-30 DIAGNOSIS — M25611 Stiffness of right shoulder, not elsewhere classified: Secondary | ICD-10-CM | POA: Diagnosis not present

## 2019-11-30 NOTE — Therapy (Signed)
[place], [place], [address] Phone: [phone]   Fax:  [phone]  Physical Therapy Treatment  Patient Details  Name: Stacey Kelly MRN: [id number] Date of Birth: [birth year] Referring Provider (PT): RA Theda Sers   Encounter Date: 11/30/2019  PT End of Session - 11/30/19 1006    Visit Number  55    Authorization Type  W/C    PT Start Time  0845    PT Stop Time  0925    PT Time Calculation (min)  40 min    Activity Tolerance  Patient tolerated treatment well    Behavior During Therapy  [facility] for tasks assessed/performed       Past Medical History:  Diagnosis Date  . Depression   . Heart murmur    ASYMPTOMATIC--  ECHO  1995  MILD REGURG  . Migraine   . OA (osteoarthritis)    shoulders  . PONV (postoperative nausea and vomiting)   . Rotator cuff tear, right   . Vitamin D deficiency   . Wears glasses     Past Surgical History:  Procedure Laterality Date  . BUNIONECTOMY Right 2004   BOTH SIDES OF FOOT  . CRYOTHERAPY    . HARDWARE REMOVAL Right 04/15/2014   Procedure: EXCISION OF RIGHT FOOT DORSAL HARDWARE;  Surgeon: Wylene Simmer, MD;  Location: [facility];  Service: Orthopedics;  Laterality: Right;  . RIGHT SHOULDER SURGERY  X3  LAST ONE 2010   INCLUDING ROTATOR CUFF REPAIR /  RECONSTRUCTION  . SHOULDER ARTHROSCOPY WITH ROTATOR CUFF REPAIR Left 03/18/2014   Procedure: LEFT SHOULDER ARTHROSCOPY WITH EVALUATION UNDER ANESTHESIA DEBRIDEMENT SUBACROMIAL DECOMPRESSION DISTAL CLAVICAL RESECTION ROTATOR CUFF REPAIR BICEPS  TENDINOSIS;  Surgeon: Sydnee Cabal, MD;  Location: [facility];  Service: Orthopedics;  Laterality: Left;  . SHOULDER ARTHROSCOPY WITH ROTATOR CUFF REPAIR Right 06/23/2019   Procedure: SHOULDER ARTHROSCOPY WITH ROTATOR CUFF REPAIR , biceps tenotomy;  Surgeon: Sydnee Cabal, MD;  Location: [facility];  Service: Orthopedics;  Laterality:  Right;  . TONSILLECTOMY  AGE 43    There were no vitals filed for this visit.  Subjective Assessment - 11/30/19 0852    Subjective  Patient reports that she is stiff and sore    Currently in Pain?  Yes    Pain Score  2     Pain Location  Shoulder    Pain Orientation  Right    Aggravating Factors   reaching up and out                       [facility] Adult PT Treatment/Exercise - 11/30/19 0001      Shoulder Exercises: Standing   Other Standing Exercises  right arm ball approximation on wall with circles, back to wall with ball going from 90 degrees to overhead    Other Standing Exercises  Drop and catch flex & abdgreen ball 2x5, weightless ball 2x10, weighted ball pass around waist both ways      Shoulder Exercises: ROM/Strengthening   UBE (Upper Arm Bike)  constant work 15 watts 6 minutes    Wall Wash  all motions focus on the upper ROM, some eccentric work to get the top ROM, used 1#    Wall Pushups  20 reps    Wall Pushups Limitations  with ball in 4 different posistions    Other ROM/Strengthening Exercises  I's, Y' 1lb 2x10, T's no weight  Manual Therapy   Manual Therapy  Passive ROM;Soft tissue mobilization    Soft tissue mobilization  holding scapulae with PROM to stretch teres and lats    Passive ROM  right GH joint mobs all , passive ROM with contract relax at end range               PT Short Term Goals - 07/03/19 1024      PT SHORT TERM GOAL #1   Title  independent with initial HEP    Status  Achieved        PT Long Term Goals - 11/09/19 0837      PT LONG TERM GOAL #1   Title  decrease pain 50%    Status  Achieved      PT LONG TERM GOAL #2   Title  AROM of the right shoulder to WFL's    Status  Partially Met      PT LONG TERM GOAL #3   Title  report no difficulty with dressing and doing hair    Status  Partially Met      PT LONG TERM GOAL #4   Title  sleep without pain down the arm    Status  Achieved      PT LONG TERM GOAL  #5   Title  lift 5# overhead with right arm    Status  On-going            Plan - 11/30/19 1006    Clinical Impression Statement  Patient having difficulty with lifting the arm away from the body, and strength over shoulder height, she does have a lot of popping with over shoulder activitiy, she needs cues to retract scapulae and at times needs cues to keep elbow in.    PT Next Visit Plan  RUE strengthening away from body    Consulted and Agree with Plan of Care  Patient       Patient will benefit from skilled therapeutic intervention in order to improve the following deficits and impairments:  Pain, Postural dysfunction, Increased muscle spasms, Decreased scar mobility, Decreased activity tolerance, Decreased range of motion, Decreased strength, Impaired UE functional use, Impaired flexibility, Increased edema  Visit Diagnosis: Stiffness of right shoulder, not elsewhere classified  Acute pain of right shoulder     Problem List Patient Active Problem List   Diagnosis Date Noted  . S/P rotator cuff repair 03/18/2014    Sumner Boast., PT 11/30/2019, 10:08 AM  [place] [address] [place], [place], [address] Phone: [phone]   Fax:  [phone]  Name: Stacey Kelly MRN: [id number] Date of Birth: Apr 03, 1956

## 2019-12-04 ENCOUNTER — Other Ambulatory Visit: Payer: Self-pay

## 2019-12-04 ENCOUNTER — Encounter: Payer: Self-pay | Admitting: Physical Therapy

## 2019-12-04 ENCOUNTER — Ambulatory Visit: Payer: PRIVATE HEALTH INSURANCE | Admitting: Physical Therapy

## 2019-12-04 DIAGNOSIS — R600 Localized edema: Secondary | ICD-10-CM

## 2019-12-04 DIAGNOSIS — M25611 Stiffness of right shoulder, not elsewhere classified: Secondary | ICD-10-CM

## 2019-12-04 DIAGNOSIS — M25511 Pain in right shoulder: Secondary | ICD-10-CM

## 2019-12-04 NOTE — Therapy (Signed)
[place], [place], [address] Phone: [phone]   Fax:  [phone]  Physical Therapy Treatment  Patient Details  Name: Stacey Kelly MRN: [id number] Date of Birth: October 25, 1956 Referring Provider (PT): RA Theda Sers   Encounter Date: 12/04/2019  PT End of Session - 12/04/19 0925    Visit Number  61    Authorization Type  W/C    PT Start Time  0850    PT Stop Time  0925    PT Time Calculation (min)  35 min    Activity Tolerance  Patient tolerated treatment well    Behavior During Therapy  [facility] for tasks assessed/performed       Past Medical History:  Diagnosis Date  . Depression   . Heart murmur    ASYMPTOMATIC--  ECHO  1995  MILD REGURG  . Migraine   . OA (osteoarthritis)    shoulders  . PONV (postoperative nausea and vomiting)   . Rotator cuff tear, right   . Vitamin D deficiency   . Wears glasses     Past Surgical History:  Procedure Laterality Date  . BUNIONECTOMY Right 2004   BOTH SIDES OF FOOT  . CRYOTHERAPY    . HARDWARE REMOVAL Right 04/15/2014   Procedure: EXCISION OF RIGHT FOOT DORSAL HARDWARE;  Surgeon: Wylene Simmer, MD;  Location: [facility];  Service: Orthopedics;  Laterality: Right;  . RIGHT SHOULDER SURGERY  X3  LAST ONE 2010   INCLUDING ROTATOR CUFF REPAIR /  RECONSTRUCTION  . SHOULDER ARTHROSCOPY WITH ROTATOR CUFF REPAIR Left 03/18/2014   Procedure: LEFT SHOULDER ARTHROSCOPY WITH EVALUATION UNDER ANESTHESIA DEBRIDEMENT SUBACROMIAL DECOMPRESSION DISTAL CLAVICAL RESECTION ROTATOR CUFF REPAIR BICEPS  TENDINOSIS;  Surgeon: Sydnee Cabal, MD;  Location: [facility];  Service: Orthopedics;  Laterality: Left;  . SHOULDER ARTHROSCOPY WITH ROTATOR CUFF REPAIR Right 06/23/2019   Procedure: SHOULDER ARTHROSCOPY WITH ROTATOR CUFF REPAIR , biceps tenotomy;  Surgeon: Sydnee Cabal, MD;  Location: [facility];  Service: Orthopedics;  Laterality:  Right;  . TONSILLECTOMY  AGE 58    There were no vitals filed for this visit.  Subjective Assessment - 12/04/19 0852    Subjective  "I may had did too much yesterday, I try not to think about my arm"    Currently in Pain?  Yes    Pain Score  1     Pain Location  Shoulder    Pain Orientation  Right                       [facility] Adult PT Treatment/Exercise - 12/04/19 0001      Shoulder Exercises: Supine   Flexion  15 reps;Right   with a focus on keep elbow in     Shoulder Exercises: Standing   Other Standing Exercises  3 level cabinet reaches 1lb  flex & abd  x 10, RUE punches yellow band downward pull 2x10     Other Standing Exercises  Drop and catch flex & abdgreen ball 2x5, weightless ball x10, weighted ball pass around waist both ways      Shoulder Exercises: ROM/Strengthening   UBE (Upper Arm Bike)  constant work 15 watts 6 minutes    Wall Pushups  20 reps      Manual Therapy   Manual Therapy  Passive ROM;Soft tissue mobilization    Passive ROM  Passive R shoulder flex keeping elbow in  PT Short Term Goals - 07/03/19 1024      PT SHORT TERM GOAL #1   Title  independent with initial HEP    Status  Achieved        PT Long Term Goals - 11/09/19 0837      PT LONG TERM GOAL #1   Title  decrease pain 50%    Status  Achieved      PT LONG TERM GOAL #2   Title  AROM of the right shoulder to WFL's    Status  Partially Met      PT LONG TERM GOAL #3   Title  report no difficulty with dressing and doing hair    Status  Partially Met      PT LONG TERM GOAL #4   Title  sleep without pain down the arm    Status  Achieved      PT LONG TERM GOAL #5   Title  lift 5# overhead with right arm    Status  On-going            Plan - 12/04/19 0926    Clinical Impression Statement  Pt 5 minutes late for PT today. Open chain interventions remains difficult. Pt had a hard tome with RUE flexion keeping her elbow in. RUE is very weak  with motions above 90 deg's and fatigue quick. Mt focused on R shoulder flexion keeping her elbow in.    Stability/Clinical Decision Making  Evolving/Moderate complexity    Rehab Potential  Good    PT Frequency  2x / week    PT Duration  8 weeks    PT Treatment/Interventions  ADLs/Self Care Home Management;Cryotherapy;Electrical Stimulation;Therapeutic activities;Therapeutic exercise;Patient/family education;Manual techniques;Vasopneumatic Device    PT Next Visit Plan  RUE strengthening away from body       Patient will benefit from skilled therapeutic intervention in order to improve the following deficits and impairments:  Pain, Postural dysfunction, Increased muscle spasms, Decreased scar mobility, Decreased activity tolerance, Decreased range of motion, Decreased strength, Impaired UE functional use, Impaired flexibility, Increased edema  Visit Diagnosis: Acute pain of right shoulder  Localized edema  Stiffness of right shoulder, not elsewhere classified     Problem List Patient Active Problem List   Diagnosis Date Noted  . S/P rotator cuff repair 03/18/2014    Scot Jun, PTA 12/04/2019, 9:28 AM  [place] [address] [place], [place], [address] Phone: [phone]   Fax:  [phone]  Name: Stacey Kelly MRN: [id number] Date of Birth: [birth year]

## 2019-12-08 ENCOUNTER — Encounter: Payer: Self-pay | Admitting: Physical Therapy

## 2019-12-08 ENCOUNTER — Ambulatory Visit: Payer: PRIVATE HEALTH INSURANCE | Admitting: Physical Therapy

## 2019-12-08 ENCOUNTER — Other Ambulatory Visit: Payer: Self-pay

## 2019-12-08 DIAGNOSIS — M25511 Pain in right shoulder: Secondary | ICD-10-CM

## 2019-12-08 DIAGNOSIS — M25611 Stiffness of right shoulder, not elsewhere classified: Secondary | ICD-10-CM | POA: Diagnosis not present

## 2019-12-08 DIAGNOSIS — R600 Localized edema: Secondary | ICD-10-CM

## 2019-12-08 NOTE — Therapy (Signed)
[place], [place], [address] Phone: [phone]   Fax:  [phone]  Physical Therapy Treatment  Patient Details  Name: Stacey Kelly MRN: [id number] Date of Birth: 28-Sep-1956 Referring Provider (PT): RA Theda Sers   Encounter Date: 12/08/2019  PT End of Session - 12/08/19 0845    Visit Number  46    Number of Visits  15    Authorization Type  W/C    PT Start Time  0807    PT Stop Time  0846    PT Time Calculation (min)  39 min    Activity Tolerance  Patient tolerated treatment well    Behavior During Therapy  [facility] for tasks assessed/performed       Past Medical History:  Diagnosis Date  . Depression   . Heart murmur    ASYMPTOMATIC--  ECHO  1995  MILD REGURG  . Migraine   . OA (osteoarthritis)    shoulders  . PONV (postoperative nausea and vomiting)   . Rotator cuff tear, right   . Vitamin D deficiency   . Wears glasses     Past Surgical History:  Procedure Laterality Date  . BUNIONECTOMY Right 2004   BOTH SIDES OF FOOT  . CRYOTHERAPY    . HARDWARE REMOVAL Right 04/15/2014   Procedure: EXCISION OF RIGHT FOOT DORSAL HARDWARE;  Surgeon: Wylene Simmer, MD;  Location: [place];  Service: Orthopedics;  Laterality: Right;  . RIGHT SHOULDER SURGERY  X3  LAST ONE 2010   INCLUDING ROTATOR CUFF REPAIR /  RECONSTRUCTION  . SHOULDER ARTHROSCOPY WITH ROTATOR CUFF REPAIR Left 03/18/2014   Procedure: LEFT SHOULDER ARTHROSCOPY WITH EVALUATION UNDER ANESTHESIA DEBRIDEMENT SUBACROMIAL DECOMPRESSION DISTAL CLAVICAL RESECTION ROTATOR CUFF REPAIR BICEPS  TENDINOSIS;  Surgeon: Sydnee Cabal, MD;  Location: [place];  Service: Orthopedics;  Laterality: Left;  . SHOULDER ARTHROSCOPY WITH ROTATOR CUFF REPAIR Right 06/23/2019   Procedure: SHOULDER ARTHROSCOPY WITH ROTATOR CUFF REPAIR , biceps tenotomy;  Surgeon: Sydnee Cabal, MD;  Location: [facility];  Service:  Orthopedics;  Laterality: Right;  . TONSILLECTOMY  AGE 64    There were no vitals filed for this visit.  Subjective Assessment - 12/08/19 0807    Subjective  "Just stiff"    Currently in Pain?  Yes    Pain Score  2     Pain Location  Shoulder    Pain Orientation  Right         [facility] PT Assessment - 12/08/19 0001      AROM   Right Shoulder Flexion  180 Degrees    Right Shoulder ABduction  165 Degrees    Right Shoulder Internal Rotation  55 Degrees    Right Shoulder External Rotation  90 Degrees                   [facility] Adult PT Treatment/Exercise - 12/08/19 0001      Shoulder Exercises: Standing   External Rotation  Right;Theraband;20 reps    Theraband Level (Shoulder External Rotation)  Level 3 (Green)    Internal Rotation  Strengthening;Right;20 reps;Theraband    Theraband Level (Shoulder Internal Rotation)  Level 3 (Green)    Flexion  20 reps;Both;Strengthening;Theraband    Shoulder Flexion Weight (lbs)  2    ABduction  Both;Strengthening;Weights;20 reps    Shoulder ABduction Weight (lbs)  2    Other Standing Exercises  OHP yellow ball 2x15. RUE punches yello  wtband downward pull 2x10     Other Standing Exercises  Triceps ext 35lb 2x10, RUE OHP red ball x 10 x5       Shoulder Exercises: ROM/Strengthening   UBE (Upper Arm Bike)  constant work 15 watts 6 minutes    Other ROM/Strengthening Exercises  Rows 45lb 2x10, Lats 35lb 2x15; Chest press 15lb 2x10                PT Short Term Goals - 07/03/19 1024      PT SHORT TERM GOAL #1   Title  independent with initial HEP    Status  Achieved        PT Long Term Goals - 11/09/19 8588      PT LONG TERM GOAL #1   Title  decrease pain 50%    Status  Achieved      PT LONG TERM GOAL #2   Title  AROM of the right shoulder to WFL's    Status  Partially Met      PT LONG TERM GOAL #3   Title  report no difficulty with dressing and doing hair    Status  Partially Met      PT LONG TERM GOAL #4    Title  sleep without pain down the arm    Status  Achieved      PT LONG TERM GOAL #5   Title  lift 5# overhead with right arm    Status  On-going            Plan - 12/08/19 0846    Clinical Impression Statement  7 minutes late reporting increase tightness. ROM is good with some R shoulder internal rotation limitations. Open chain interventions remains difficult due to weakness. Difficulty keeping R elbow in with OHP and flexion motions. Tactile cues needed with resisted ER to prevent postural compensations.    Stability/Clinical Decision Making  Evolving/Moderate complexity    PT Frequency  2x / week    PT Duration  8 weeks    PT Treatment/Interventions  ADLs/Self Care Home Management;Cryotherapy;Electrical Stimulation;Therapeutic activities;Therapeutic exercise;Patient/family education;Manual techniques;Vasopneumatic Device    PT Next Visit Plan  RUE strengthening away from body       Patient will benefit from skilled therapeutic intervention in order to improve the following deficits and impairments:  Pain, Postural dysfunction, Increased muscle spasms, Decreased scar mobility, Decreased activity tolerance, Decreased range of motion, Decreased strength, Impaired UE functional use, Impaired flexibility, Increased edema  Visit Diagnosis: Acute pain of right shoulder  Localized edema  Stiffness of right shoulder, not elsewhere classified     Problem List Patient Active Problem List   Diagnosis Date Noted  . S/P rotator cuff repair 03/18/2014    Scot Jun, PTA 12/08/2019, 8:50 AM  [address] [address] [place], [place], [address] Phone: [phone]   Fax:  [phone]  Name: Stacey Kelly MRN: [id number] Date of Birth: 1956/04/23

## 2019-12-11 ENCOUNTER — Encounter: Payer: Self-pay | Admitting: Physical Therapy

## 2019-12-11 ENCOUNTER — Other Ambulatory Visit: Payer: Self-pay

## 2019-12-11 ENCOUNTER — Ambulatory Visit: Payer: PRIVATE HEALTH INSURANCE | Admitting: Physical Therapy

## 2019-12-11 DIAGNOSIS — M25611 Stiffness of right shoulder, not elsewhere classified: Secondary | ICD-10-CM

## 2019-12-11 DIAGNOSIS — M25511 Pain in right shoulder: Secondary | ICD-10-CM

## 2019-12-11 DIAGNOSIS — R600 Localized edema: Secondary | ICD-10-CM

## 2019-12-11 NOTE — Therapy (Signed)
[place], [place], [address] Phone: [phone]   Fax:  [phone]  Physical Therapy Treatment  Patient Details  Name: Stacey Kelly MRN: [id number] Date of Birth: December 07, 1955 Referring Provider (PT): RA Theda Sers   Encounter Date: 12/11/2019  PT End of Session - 12/11/19 0931    Visit Number  16    PT Start Time  0850    PT Stop Time  0931    PT Time Calculation (min)  41 min    Activity Tolerance  Patient tolerated treatment well    Behavior During Therapy  [facility] for tasks assessed/performed       Past Medical History:  Diagnosis Date  . Depression   . Heart murmur    ASYMPTOMATIC--  ECHO  1995  MILD REGURG  . Migraine   . OA (osteoarthritis)    shoulders  . PONV (postoperative nausea and vomiting)   . Rotator cuff tear, right   . Vitamin D deficiency   . Wears glasses     Past Surgical History:  Procedure Laterality Date  . BUNIONECTOMY Right 2004   BOTH SIDES OF FOOT  . CRYOTHERAPY    . HARDWARE REMOVAL Right 04/15/2014   Procedure: EXCISION OF RIGHT FOOT DORSAL HARDWARE;  Surgeon: Wylene Simmer, MD;  Location: [place];  Service: Orthopedics;  Laterality: Right;  . RIGHT SHOULDER SURGERY  X3  LAST ONE 2010   INCLUDING ROTATOR CUFF REPAIR /  RECONSTRUCTION  . SHOULDER ARTHROSCOPY WITH ROTATOR CUFF REPAIR Left 03/18/2014   Procedure: LEFT SHOULDER ARTHROSCOPY WITH EVALUATION UNDER ANESTHESIA DEBRIDEMENT SUBACROMIAL DECOMPRESSION DISTAL CLAVICAL RESECTION ROTATOR CUFF REPAIR BICEPS  TENDINOSIS;  Surgeon: Sydnee Cabal, MD;  Location: [place];  Service: Orthopedics;  Laterality: Left;  . SHOULDER ARTHROSCOPY WITH ROTATOR CUFF REPAIR Right 06/23/2019   Procedure: SHOULDER ARTHROSCOPY WITH ROTATOR CUFF REPAIR , biceps tenotomy;  Surgeon: Sydnee Cabal, MD;  Location: [facility];  Service: Orthopedics;  Laterality: Right;  . TONSILLECTOMY   AGE 23    There were no vitals filed for this visit.  Subjective Assessment - 12/11/19 0851    Subjective  "Just stiff"    Limitations  Lifting;House hold activities    Patient Stated Goals  get back to work    Currently in Pain?  Yes    Pain Score  1     Pain Location  Shoulder                       [facility] Adult PT Treatment/Exercise - 12/11/19 0001      Shoulder Exercises: Seated   Other Seated Exercises  Bent over rev rear Deltoids 2lb 3x10       Shoulder Exercises: Standing   External Rotation  Right;Theraband;10 reps   x10   Theraband Level (Shoulder External Rotation)  Level 2 (Red)    Internal Rotation  Strengthening;Right;20 reps;Theraband    Theraband Level (Shoulder Internal Rotation)  Level 3 (Green)    Other Standing Exercises  Triceps ext 45lb 3x10, RUE OHP red ball 3x5       Shoulder Exercises: ROM/Strengthening   UBE (Upper Arm Bike)  constant work 15 watts 6 minutes    Other ROM/Strengthening Exercises  Rows 45lb 2x15, Lats 35lb 2x15; Chest press 15lb 2x15      Manual Therapy   Manual Therapy  Passive ROM;Soft tissue mobilization    Passive ROM  Passive  R shoulder flex keeping elbow in               PT Short Term Goals - 07/03/19 1024      PT SHORT TERM GOAL #1   Title  independent with initial HEP    Status  Achieved        PT Long Term Goals - 11/09/19 0837      PT LONG TERM GOAL #1   Title  decrease pain 50%    Status  Achieved      PT LONG TERM GOAL #2   Title  AROM of the right shoulder to WFL's    Status  Partially Met      PT LONG TERM GOAL #3   Title  report no difficulty with dressing and doing hair    Status  Partially Met      PT LONG TERM GOAL #4   Title  sleep without pain down the arm    Status  Achieved      PT LONG TERM GOAL #5   Title  lift 5# overhead with right arm    Status  On-going            Plan - 12/11/19 0933    Clinical Impression Statement  5 minutes late. She has good  strength with all pulling interventions, Weakness remains with open chain interventions away from body. Assist needed with OHP. Some pain with MT, moving shoulder into flexion keeping elbow in    Stability/Clinical Decision Making  Evolving/Moderate complexity    Rehab Potential  Good    PT Frequency  2x / week    PT Duration  8 weeks    PT Treatment/Interventions  ADLs/Self Care Home Management;Cryotherapy;Electrical Stimulation;Therapeutic activities;Therapeutic exercise;Patient/family education;Manual techniques;Vasopneumatic Device    PT Next Visit Plan  RUE strengthening away from body       Patient will benefit from skilled therapeutic intervention in order to improve the following deficits and impairments:  Pain, Postural dysfunction, Increased muscle spasms, Decreased scar mobility, Decreased activity tolerance, Decreased range of motion, Decreased strength, Impaired UE functional use, Impaired flexibility, Increased edema  Visit Diagnosis: Stiffness of right shoulder, not elsewhere classified  Localized edema  Acute pain of right shoulder     Problem List Patient Active Problem List   Diagnosis Date Noted  . S/P rotator cuff repair 03/18/2014    Scot Jun, PTA 12/11/2019, 9:35 AM  [place] [address] [place], [place], [address] Phone: [phone]   Fax:  [phone]  Name: Stacey Kelly MRN: [id number] Date of Birth: [birth year]

## 2019-12-15 ENCOUNTER — Ambulatory Visit: Payer: PRIVATE HEALTH INSURANCE | Admitting: Physical Therapy

## 2019-12-15 ENCOUNTER — Encounter: Payer: Self-pay | Admitting: Physical Therapy

## 2019-12-15 ENCOUNTER — Other Ambulatory Visit: Payer: Self-pay

## 2019-12-15 DIAGNOSIS — R600 Localized edema: Secondary | ICD-10-CM

## 2019-12-15 DIAGNOSIS — M25611 Stiffness of right shoulder, not elsewhere classified: Secondary | ICD-10-CM | POA: Diagnosis not present

## 2019-12-15 DIAGNOSIS — M25511 Pain in right shoulder: Secondary | ICD-10-CM

## 2019-12-15 NOTE — Therapy (Signed)
[place], [place], [address] Phone: [phone]   Fax:  [phone]  Physical Therapy Treatment  Patient Details  Name: Stacey Kelly MRN: [id number] Date of Birth: 1956/02/01 Referring Provider (PT): RA Theda Sers   Encounter Date: 12/15/2019  PT End of Session - 12/15/19 0842    Visit Number  12    Authorization Type  W/C    PT Start Time  0805    PT Stop Time  0845    PT Time Calculation (min)  40 min    Activity Tolerance  Patient tolerated treatment well    Behavior During Therapy  [facility] for tasks assessed/performed       Past Medical History:  Diagnosis Date  . Depression   . Heart murmur    ASYMPTOMATIC--  ECHO  1995  MILD REGURG  . Migraine   . OA (osteoarthritis)    shoulders  . PONV (postoperative nausea and vomiting)   . Rotator cuff tear, right   . Vitamin D deficiency   . Wears glasses     Past Surgical History:  Procedure Laterality Date  . BUNIONECTOMY Right 2004   BOTH SIDES OF FOOT  . CRYOTHERAPY    . HARDWARE REMOVAL Right 04/15/2014   Procedure: EXCISION OF RIGHT FOOT DORSAL HARDWARE;  Surgeon: Wylene Simmer, MD;  Location: [place];  Service: Orthopedics;  Laterality: Right;  . RIGHT SHOULDER SURGERY  X3  LAST ONE 2010   INCLUDING ROTATOR CUFF REPAIR /  RECONSTRUCTION  . SHOULDER ARTHROSCOPY WITH ROTATOR CUFF REPAIR Left 03/18/2014   Procedure: LEFT SHOULDER ARTHROSCOPY WITH EVALUATION UNDER ANESTHESIA DEBRIDEMENT SUBACROMIAL DECOMPRESSION DISTAL CLAVICAL RESECTION ROTATOR CUFF REPAIR BICEPS  TENDINOSIS;  Surgeon: Sydnee Cabal, MD;  Location: [place];  Service: Orthopedics;  Laterality: Left;  . SHOULDER ARTHROSCOPY WITH ROTATOR CUFF REPAIR Right 06/23/2019   Procedure: SHOULDER ARTHROSCOPY WITH ROTATOR CUFF REPAIR , biceps tenotomy;  Surgeon: Sydnee Cabal, MD;  Location: [facility];  Service: Orthopedics;   Laterality: Right;  . TONSILLECTOMY  AGE 28    There were no vitals filed for this visit.  Subjective Assessment - 12/15/19 0801    Subjective  "stiff"    Currently in Pain?  Yes    Pain Score  2     Pain Location  Shoulder    Pain Orientation  Right                       [facility] Adult PT Treatment/Exercise - 12/15/19 0001      Shoulder Exercises: Seated   Other Seated Exercises  Seated Flex 2lb 2x10     Other Seated Exercises  Bent over rev rear Deltoids 2lb 3x10       Shoulder Exercises: ROM/Strengthening   UBE (Upper Arm Bike)  constant work 15 watts 6 minutes    Wall Wash  all motions focus on the upper ROM, some eccentric work to get the top ROM,    Other ROM/Strengthening Exercises  Rows 55lb 3x10, Lats 45lb 2x10; Chest press 20lb 2x10    Other ROM/Strengthening Exercises  I's, Y' 1lb 2x10, T's no weight, Curls and press 2lb 2x10       Manual Therapy   Manual Therapy  Passive ROM;Soft tissue mobilization    Passive ROM  Passive R shoulder flex keeping elbow in  PT Short Term Goals - 07/03/19 1024      PT SHORT TERM GOAL #1   Title  independent with initial HEP    Status  Achieved        PT Long Term Goals - 12/15/19 0843      PT LONG TERM GOAL #1   Title  decrease pain 50%    Status  Achieved      PT LONG TERM GOAL #2   Title  AROM of the right shoulder to WFL's      PT LONG TERM GOAL #3   Title  report no difficulty with dressing and doing hair    Status  Partially Met      PT LONG TERM GOAL #4   Title  sleep without pain down the arm    Status  Achieved      PT LONG TERM GOAL #5   Title  lift 5# overhead with right arm    Status  On-going            Plan - 12/15/19 0843    Clinical Impression Statement  5 Minutes late. Weakness with UE away from body and about 90 degrees remains. Some struggle with wall wash during activities away overhead. Good strength with rows and lats. Tolerated MT better, less pain  with flexion motions keeping elbow in    Stability/Clinical Decision Making  Evolving/Moderate complexity    Rehab Potential  Good    PT Frequency  2x / week    PT Duration  8 weeks    PT Treatment/Interventions  ADLs/Self Care Home Management;Cryotherapy;Electrical Stimulation;Therapeutic activities;Therapeutic exercise;Patient/family education;Manual techniques;Vasopneumatic Device    PT Next Visit Plan  RUE strengthening away from body       Patient will benefit from skilled therapeutic intervention in order to improve the following deficits and impairments:  Pain, Postural dysfunction, Increased muscle spasms, Decreased scar mobility, Decreased activity tolerance, Decreased range of motion, Decreased strength, Impaired UE functional use, Impaired flexibility, Increased edema  Visit Diagnosis: Localized edema  Acute pain of right shoulder  Stiffness of right shoulder, not elsewhere classified     Problem List Patient Active Problem List   Diagnosis Date Noted  . S/P rotator cuff repair 03/18/2014    Scot Jun, PTA 12/15/2019, 8:46 AM  [place] [address] [place], [place], [address] Phone: [phone]   Fax:  [phone]  Name: Stacey Kelly MRN: [id number] Date of Birth: 1956-06-15

## 2019-12-18 ENCOUNTER — Ambulatory Visit: Payer: PRIVATE HEALTH INSURANCE | Attending: Specialist | Admitting: Physical Therapy

## 2019-12-18 ENCOUNTER — Encounter: Payer: Self-pay | Admitting: Physical Therapy

## 2019-12-18 ENCOUNTER — Other Ambulatory Visit: Payer: Self-pay

## 2019-12-18 DIAGNOSIS — R600 Localized edema: Secondary | ICD-10-CM | POA: Diagnosis present

## 2019-12-18 DIAGNOSIS — M25511 Pain in right shoulder: Secondary | ICD-10-CM | POA: Diagnosis not present

## 2019-12-18 DIAGNOSIS — M25611 Stiffness of right shoulder, not elsewhere classified: Secondary | ICD-10-CM | POA: Insufficient documentation

## 2019-12-18 NOTE — Therapy (Signed)
[place], [place], [address] Phone: [phone]   Fax:  [phone]  Physical Therapy Treatment  Patient Details  Name: Stacey Kelly MRN: [id number] Date of Birth: 08-06-1956 Referring Provider (PT): RA Theda Sers   Encounter Date: 12/18/2019  PT End of Session - 12/18/19 0843    Visit Number  55    PT Start Time  0805    PT Stop Time  0845    PT Time Calculation (min)  40 min    Activity Tolerance  Patient tolerated treatment well    Behavior During Therapy  [facility] for tasks assessed/performed       Past Medical History:  Diagnosis Date  . Depression   . Heart murmur    ASYMPTOMATIC--  ECHO  1995  MILD REGURG  . Migraine   . OA (osteoarthritis)    shoulders  . PONV (postoperative nausea and vomiting)   . Rotator cuff tear, right   . Vitamin D deficiency   . Wears glasses     Past Surgical History:  Procedure Laterality Date  . BUNIONECTOMY Right 2004   BOTH SIDES OF FOOT  . CRYOTHERAPY    . HARDWARE REMOVAL Right 04/15/2014   Procedure: EXCISION OF RIGHT FOOT DORSAL HARDWARE;  Surgeon: Wylene Simmer, MD;  Location: [place];  Service: Orthopedics;  Laterality: Right;  . RIGHT SHOULDER SURGERY  X3  LAST ONE 2010   INCLUDING ROTATOR CUFF REPAIR /  RECONSTRUCTION  . SHOULDER ARTHROSCOPY WITH ROTATOR CUFF REPAIR Left 03/18/2014   Procedure: LEFT SHOULDER ARTHROSCOPY WITH EVALUATION UNDER ANESTHESIA DEBRIDEMENT SUBACROMIAL DECOMPRESSION DISTAL CLAVICAL RESECTION ROTATOR CUFF REPAIR BICEPS  TENDINOSIS;  Surgeon: Sydnee Cabal, MD;  Location: [place];  Service: Orthopedics;  Laterality: Left;  . SHOULDER ARTHROSCOPY WITH ROTATOR CUFF REPAIR Right 06/23/2019   Procedure: SHOULDER ARTHROSCOPY WITH ROTATOR CUFF REPAIR , biceps tenotomy;  Surgeon: Sydnee Cabal, MD;  Location: [facility];  Service: Orthopedics;  Laterality: Right;  . TONSILLECTOMY   AGE 64    There were no vitals filed for this visit.  Subjective Assessment - 12/18/19 0806    Subjective  Really stiff today, got a massage Wednesday and got knots broken up    Currently in Pain?  Yes    Pain Score  1     Pain Location  Shoulder    Pain Orientation  Right                       [facility] Adult PT Treatment/Exercise - 12/18/19 0001      Shoulder Exercises: Seated   Other Seated Exercises  Bent over rev rear Deltoids 2lb 3x10       Shoulder Exercises: Standing   Internal Rotation  Weights;20 reps;Right   x3   Extension  20 reps;Weights;Both    Extension Weight (lbs)  15    Other Standing Exercises  3 level cabinet reaches 1lb x 10 flex and abd     Other Standing Exercises  Drop and catch flex & abd weightless ball x10, Archers row RUE 5lb 2x10       Shoulder Exercises: ROM/Strengthening   UBE (Upper Arm Bike)  constant work 20 watts 6 minutes    Wall Wash  flexion 2lb,3lb,4lb x 10 each.     Other ROM/Strengthening Exercises  Rows 55lb x10, Lats 45lb 2x10      Manual Therapy   Manual Therapy  Passive ROM;Soft tissue mobilization    Passive ROM  Passive R shoulder flex keeping elbow in               PT Short Term Goals - 07/03/19 1024      PT SHORT TERM GOAL #1   Title  independent with initial HEP    Status  Achieved        PT Long Term Goals - 12/18/19 0844      PT LONG TERM GOAL #1   Title  decrease pain 50%    Status  Achieved      PT LONG TERM GOAL #2   Title  AROM of the right shoulder to WFL's    Status  Partially Met      PT LONG TERM GOAL #3   Title  report no difficulty with dressing and doing hair    Status  Achieved      PT LONG TERM GOAL #4   Title  sleep without pain down the arm    Status  Achieved      PT LONG TERM GOAL #5   Title  lift 5# overhead with right arm    Status  On-going            Plan - 12/18/19 0844    Clinical Impression Statement  Pt did well with initial flexion exercises  with light resistance but fatigue with 7 reps. Unable to reach third level with cabinet reaches. Added some increase resistance with wall wash utilizing flexion motion. Positive response with MT. Good strength with rows and lats.    Stability/Clinical Decision Making  Evolving/Moderate complexity    PT Frequency  2x / week    PT Duration  8 weeks    PT Treatment/Interventions  ADLs/Self Care Home Management;Cryotherapy;Electrical Stimulation;Therapeutic activities;Therapeutic exercise;Patient/family education;Manual techniques;Vasopneumatic Device    PT Next Visit Plan  RUE strengthening away from body       Patient will benefit from skilled therapeutic intervention in order to improve the following deficits and impairments:  Pain, Postural dysfunction, Increased muscle spasms, Decreased scar mobility, Decreased activity tolerance, Decreased range of motion, Decreased strength, Impaired UE functional use, Impaired flexibility, Increased edema  Visit Diagnosis: Acute pain of right shoulder  Stiffness of right shoulder, not elsewhere classified  Localized edema     Problem List Patient Active Problem List   Diagnosis Date Noted  . S/P rotator cuff repair 03/18/2014    Scot Jun, PTA 12/18/2019, 8:48 AM  [place] [address] [place], [place], [address] Phone: [phone]   Fax:  [phone]  Name: MICHEL ESKELSON MRN: [id number] Date of Birth: 1956-06-18

## 2019-12-21 ENCOUNTER — Other Ambulatory Visit: Payer: Self-pay

## 2019-12-21 ENCOUNTER — Ambulatory Visit: Payer: PRIVATE HEALTH INSURANCE | Attending: Specialist | Admitting: Physical Therapy

## 2019-12-21 ENCOUNTER — Encounter: Payer: Self-pay | Admitting: Physical Therapy

## 2019-12-21 DIAGNOSIS — M25511 Pain in right shoulder: Secondary | ICD-10-CM | POA: Insufficient documentation

## 2019-12-21 DIAGNOSIS — M25611 Stiffness of right shoulder, not elsewhere classified: Secondary | ICD-10-CM | POA: Insufficient documentation

## 2019-12-21 NOTE — Therapy (Signed)
[place], [place], [address] Phone: [phone]   Fax:  [phone]  Physical Therapy Treatment  Patient Details  Name: Stacey Kelly MRN: [id number] Date of Birth: May 08, 1956 Referring Provider (PT): RA Theda Sers   Encounter Date: 12/21/2019  PT End of Session - 12/21/19 0839    Visit Number  50    Authorization Type  W/C    PT Start Time  0800    PT Stop Time  0845    PT Time Calculation (min)  45 min    Activity Tolerance  Patient tolerated treatment well    Behavior During Therapy  [facility] for tasks assessed/performed       Past Medical History:  Diagnosis Date  . Depression   . Heart murmur    ASYMPTOMATIC--  ECHO  1995  MILD REGURG  . Migraine   . OA (osteoarthritis)    shoulders  . PONV (postoperative nausea and vomiting)   . Rotator cuff tear, right   . Vitamin D deficiency   . Wears glasses     Past Surgical History:  Procedure Laterality Date  . BUNIONECTOMY Right 2004   BOTH SIDES OF FOOT  . CRYOTHERAPY    . HARDWARE REMOVAL Right 04/15/2014   Procedure: EXCISION OF RIGHT FOOT DORSAL HARDWARE;  Surgeon: Wylene Simmer, MD;  Location: [place];  Service: Orthopedics;  Laterality: Right;  . RIGHT SHOULDER SURGERY  X3  LAST ONE 2010   INCLUDING ROTATOR CUFF REPAIR /  RECONSTRUCTION  . SHOULDER ARTHROSCOPY WITH ROTATOR CUFF REPAIR Left 03/18/2014   Procedure: LEFT SHOULDER ARTHROSCOPY WITH EVALUATION UNDER ANESTHESIA DEBRIDEMENT SUBACROMIAL DECOMPRESSION DISTAL CLAVICAL RESECTION ROTATOR CUFF REPAIR BICEPS  TENDINOSIS;  Surgeon: Sydnee Cabal, MD;  Location: [place];  Service: Orthopedics;  Laterality: Left;  . SHOULDER ARTHROSCOPY WITH ROTATOR CUFF REPAIR Right 06/23/2019   Procedure: SHOULDER ARTHROSCOPY WITH ROTATOR CUFF REPAIR , biceps tenotomy;  Surgeon: Sydnee Cabal, MD;  Location: [facility];  Service: Orthopedics;   Laterality: Right;  . TONSILLECTOMY  AGE 32    There were no vitals filed for this visit.  Subjective Assessment - 12/21/19 0806    Subjective  Continues to c/o stiffness and weakness above 90 degrees of flexion and abduction    Currently in Pain?  Yes    Pain Score  1     Pain Location  Shoulder    Pain Orientation  Right    Pain Descriptors / Indicators  Sore;Tightness    Aggravating Factors   difficulty reaching above shoulder height         [facility] PT Assessment - 12/21/19 0001      AROM   Right Shoulder Flexion  180 Degrees   however has to do some compensation and has crepitus   Right Shoulder ABduction  170 Degrees   again some compensation   Right Shoulder Internal Rotation  55 Degrees   c/o tightness and has shoulder elevation   Right Shoulder External Rotation  90 Degrees      Strength   Right Shoulder Flexion  3+/5    Right Shoulder ABduction  3+/5    Right Shoulder Internal Rotation  4/5    Right Shoulder External Rotation  4-/5                   [facility] Adult PT Treatment/Exercise - 12/21/19 0001      Shoulder Exercises: Standing  Other Standing Exercises  flexion in doorway with pressure on the outside of the arm and then with pressure     Other Standing Exercises  worked a lot on motions from 90 degrees working on strength without the popping.  did a lot of approximation iwth the movements       Shoulder Exercises: ROM/Strengthening   UBE (Upper Arm Bike)  constant work 30 watts 6 minutes    Lat Pull  2 plate;20 reps    Cybex Press  1 plate;20 reps    Cybex Row  2 plate;20 reps      Manual Therapy   Manual Therapy  Passive ROM;Soft tissue mobilization    Passive ROM  Passive R shoulder flex keeping elbow in manual manipulation of the scapula to stretch lat and teres               PT Short Term Goals - 07/03/19 1024      PT SHORT TERM GOAL #1   Title  independent with initial HEP    Status  Achieved        PT Long Term  Goals - 12/21/19 0842      PT LONG TERM GOAL #1   Title  decrease pain 50%    Status  Achieved      PT LONG TERM GOAL #2   Title  AROM of the right shoulder to WFL's    Status  Partially Met      PT LONG TERM GOAL #3   Title  report no difficulty with dressing and doing hair    Status  Achieved      PT LONG TERM GOAL #4   Title  sleep without pain down the arm    Status  Achieved      PT LONG TERM GOAL #5   Title  lift 5# overhead with right arm    Status  On-going            Plan - 12/21/19 0839    Clinical Impression Statement  patient has made very good improvements in AROM and with strength, she still has some significant issues at 90 degrees flexion and abduction, she has a lot of significant crepitus at this angle when going into flexion and or abduction, she tends to compensate by elevating the shoulder or finding a different angle for the arm to be in not tru flexion but some scaption, this is pretty painful and she is very weak above 90 degrees    PT Next Visit Plan  will defer to MD on whether we continue, she reports massage seems to be helping and she is not as tight in the lats and the teres    Consulted and Agree with Plan of Care  Patient       Patient will benefit from skilled therapeutic intervention in order to improve the following deficits and impairments:  Pain, Postural dysfunction, Increased muscle spasms, Decreased scar mobility, Decreased activity tolerance, Decreased range of motion, Decreased strength, Impaired UE functional use, Impaired flexibility, Increased edema  Visit Diagnosis: Acute pain of right shoulder  Stiffness of right shoulder, not elsewhere classified     Problem List Patient Active Problem List   Diagnosis Date Noted  . S/P rotator cuff repair 03/18/2014    Sumner Boast., PT 12/21/2019, 8:43 AM  [place] [address] [place], [place],  [address] Phone: [phone]   Fax:  [phone]  Name: Stacey  ANNIE Kelly MRN: [id number] Date of Birth: June 15, 1956

## 2019-12-24 ENCOUNTER — Other Ambulatory Visit: Payer: Self-pay

## 2019-12-24 ENCOUNTER — Ambulatory Visit: Payer: PRIVATE HEALTH INSURANCE

## 2019-12-24 DIAGNOSIS — M25611 Stiffness of right shoulder, not elsewhere classified: Secondary | ICD-10-CM

## 2019-12-24 DIAGNOSIS — R600 Localized edema: Secondary | ICD-10-CM

## 2019-12-24 DIAGNOSIS — M25511 Pain in right shoulder: Secondary | ICD-10-CM

## 2019-12-24 NOTE — Therapy (Signed)
[place], [place], [address] Phone: [phone]   Fax:  [phone]  Physical Therapy Treatment  Patient Details  Name: Stacey Kelly MRN: [id number] Date of Birth: 12-21-1955 Referring Provider (PT): RA Theda Sers   Encounter Date: 12/24/2019  PT End of Session - 12/24/19 1415    Visit Number  54    Authorization Type  W/C    PT Start Time  8828   pt. arrived late   PT Stop Time  1451    PT Time Calculation (min)  43 min    Activity Tolerance  Patient tolerated treatment well    Behavior During Therapy  [facility] for tasks assessed/performed       Past Medical History:  Diagnosis Date  . Depression   . Heart murmur    ASYMPTOMATIC--  ECHO  1995  MILD REGURG  . Migraine   . OA (osteoarthritis)    shoulders  . PONV (postoperative nausea and vomiting)   . Rotator cuff tear, right   . Vitamin D deficiency   . Wears glasses     Past Surgical History:  Procedure Laterality Date  . BUNIONECTOMY Right 2004   BOTH SIDES OF FOOT  . CRYOTHERAPY    . HARDWARE REMOVAL Right 04/15/2014   Procedure: EXCISION OF RIGHT FOOT DORSAL HARDWARE;  Surgeon: Wylene Simmer, MD;  Location: [place];  Service: Orthopedics;  Laterality: Right;  . RIGHT SHOULDER SURGERY  X3  LAST ONE 2010   INCLUDING ROTATOR CUFF REPAIR /  RECONSTRUCTION  . SHOULDER ARTHROSCOPY WITH ROTATOR CUFF REPAIR Left 03/18/2014   Procedure: LEFT SHOULDER ARTHROSCOPY WITH EVALUATION UNDER ANESTHESIA DEBRIDEMENT SUBACROMIAL DECOMPRESSION DISTAL CLAVICAL RESECTION ROTATOR CUFF REPAIR BICEPS  TENDINOSIS;  Surgeon: Sydnee Cabal, MD;  Location: [place];  Service: Orthopedics;  Laterality: Left;  . SHOULDER ARTHROSCOPY WITH ROTATOR CUFF REPAIR Right 06/23/2019   Procedure: SHOULDER ARTHROSCOPY WITH ROTATOR CUFF REPAIR , biceps tenotomy;  Surgeon: Sydnee Cabal, MD;  Location: [facility];  Service:  Orthopedics;  Laterality: Right;  . TONSILLECTOMY  AGE 64    There were no vitals filed for this visit.  Subjective Assessment - 12/24/19 1411    Subjective  Pt. reporting she is doing her exercises at work.    Patient Stated Goals  get back to work    Currently in Pain?  Yes    Pain Score  1     Pain Location  Shoulder    Pain Orientation  Right;Upper    Pain Descriptors / Indicators  Sore    Pain Type  Surgical pain    Pain Onset  More than a month ago    Pain Frequency  Constant    Aggravating Factors   prolonged overhead motion    Multiple Pain Sites  No         [facility] PT Assessment - 12/24/19 0001      Assessment   Referring Provider (PT)  RA Collins    Onset Date/Surgical Date  06/23/19    Hand Dominance  Right    Next MD Visit  02/01/20                   [facility] Adult PT Treatment/Exercise - 12/24/19 0001      Self-Care   Self-Care  Other Self-Care Comments    Other Self-Care Comments   Self-ball massage release to teres/infraspinatus with red med ball (1000Gr) x  1 min - pt. noting tenderness       Shoulder Exercises: Sidelying   External Rotation  Right;10 reps;Strengthening;Weights    External Rotation Weight (lbs)  1    External Rotation Limitations  Cues for scap. retraction/depression     ABduction  Right;15 reps;Strengthening;Weights    ABduction Weight (lbs)  2    ABduction Limitations  Cues for scapular upward rot.      Shoulder Exercises: Standing   Other Standing Exercises  Serratus punch wall push x 15 rpes     Other Standing Exercises  B horizontal abduction red TB x 10 rpes    cues for scapular depression      Shoulder Exercises: ROM/Strengthening   UBE (Upper Arm Bike)  Lvl 3.0, 3 min forwards/3 backwards       Shoulder Exercises: Stretch   Corner Stretch  2 reps;30 seconds    Corner Stretch Limitations  R only mid and high     Other Shoulder Stretches  R lats stretch on wall, seated with p-ball rollout, and prone childs pose x 30  sec each way    Pt. prefering prone childs pose      Manual Therapy   Manual Therapy  Soft tissue mobilization;Myofascial release    Manual therapy comments  supine and sidelying     Soft tissue mobilization  STM/DTM to R teres, lats, R infra    Myofascial Release  TPR to R infraspinatus              PT Education - 12/24/19 1504    Education Details  HEP update;  prone child's pose lats stretch    Person(s) Educated  Patient    Methods  Explanation;Demonstration;Verbal cues;Handout    Comprehension  Verbalized understanding;Returned demonstration;Verbal cues required       PT Short Term Goals - 07/03/19 1024      PT SHORT TERM GOAL #1   Title  independent with initial HEP    Status  Achieved        PT Long Term Goals - 12/21/19 1610      PT LONG TERM GOAL #1   Title  decrease pain 50%    Status  Achieved      PT LONG TERM GOAL #2   Title  AROM of the right shoulder to WFL's    Status  Partially Met      PT LONG TERM GOAL #3   Title  report no difficulty with dressing and doing hair    Status  Achieved      PT LONG TERM GOAL #4   Title  sleep without pain down the arm    Status  Achieved      PT LONG TERM GOAL #5   Title  lift 5# overhead with right arm    Status  On-going            Plan - 12/24/19 1414    Clinical Impression Statement  Pt. reporting significant improvement in R shoulder ROM following "armpit massage" over last few weeks.  MT addressing tenderness/tightness in R Teres group, infraspinatus.  Some cueing today for scapular control with serratus pushup plus on wall (pt. with visible R scap. winging).  Some crepitus with sidelying R abduction with 2#.  Tolerated all scapular retraction/depression strengthening activities well.  HEP updated with prone child's pose lats stretch (see pt. education section) as this preferred positioning for lats stretch.  Ended visit pain free thus modalities deferred.  Rehab Potential  Good    PT  Treatment/Interventions  ADLs/Self Care Home Management;Cryotherapy;Electrical Stimulation;Therapeutic activities;Therapeutic exercise;Patient/family education;Manual techniques;Vasopneumatic Device    PT Next Visit Plan  She reports massage seems to be helping and she is not as tight in the lats and the teres    Consulted and Agree with Plan of Care  Patient       Patient will benefit from skilled therapeutic intervention in order to improve the following deficits and impairments:  Pain, Postural dysfunction, Increased muscle spasms, Decreased scar mobility, Decreased activity tolerance, Decreased range of motion, Decreased strength, Impaired UE functional use, Impaired flexibility, Increased edema  Visit Diagnosis: Acute pain of right shoulder  Stiffness of right shoulder, not elsewhere classified  Localized edema     Problem List Patient Active Problem List   Diagnosis Date Noted  . S/P rotator cuff repair 03/18/2014    Bess Harvest, PTA 12/24/19 3:09 PM   [place] [address] [place], [place], [address] Phone: [phone]   Fax:  [phone]  Name: Stacey Kelly MRN: [id number] Date of Birth: [birth year]

## 2019-12-29 ENCOUNTER — Ambulatory Visit: Payer: PRIVATE HEALTH INSURANCE | Attending: Specialist | Admitting: Physical Therapy

## 2019-12-29 ENCOUNTER — Encounter: Payer: Self-pay | Admitting: Physical Therapy

## 2019-12-29 ENCOUNTER — Other Ambulatory Visit: Payer: Self-pay

## 2019-12-29 DIAGNOSIS — R600 Localized edema: Secondary | ICD-10-CM | POA: Diagnosis present

## 2019-12-29 DIAGNOSIS — M25611 Stiffness of right shoulder, not elsewhere classified: Secondary | ICD-10-CM | POA: Insufficient documentation

## 2019-12-29 DIAGNOSIS — M25511 Pain in right shoulder: Secondary | ICD-10-CM | POA: Insufficient documentation

## 2019-12-29 NOTE — Therapy (Signed)
[place], [place], [address] Phone: [phone]   Fax:  [phone]  Physical Therapy Treatment  Patient Details  Name: Stacey Kelly MRN: [id number] Date of Birth: [birth year] Referring Provider (PT): RA Theda Sers   Encounter Date: 12/29/2019  PT End of Session - 12/29/19 0925    Visit Number  46    Authorization Type  W/C    PT Start Time  0849    PT Stop Time  0930    PT Time Calculation (min)  41 min    Activity Tolerance  Patient tolerated treatment well       Past Medical History:  Diagnosis Date  . Depression   . Heart murmur    ASYMPTOMATIC--  ECHO  1995  MILD REGURG  . Migraine   . OA (osteoarthritis)    shoulders  . PONV (postoperative nausea and vomiting)   . Rotator cuff tear, right   . Vitamin D deficiency   . Wears glasses     Past Surgical History:  Procedure Laterality Date  . BUNIONECTOMY Right 2004   BOTH SIDES OF FOOT  . CRYOTHERAPY    . HARDWARE REMOVAL Right 04/15/2014   Procedure: EXCISION OF RIGHT FOOT DORSAL HARDWARE;  Surgeon: Wylene Simmer, MD;  Location: [place];  Service: Orthopedics;  Laterality: Right;  . RIGHT SHOULDER SURGERY  X3  LAST ONE 2010   INCLUDING ROTATOR CUFF REPAIR /  RECONSTRUCTION  . SHOULDER ARTHROSCOPY WITH ROTATOR CUFF REPAIR Left 03/18/2014   Procedure: LEFT SHOULDER ARTHROSCOPY WITH EVALUATION UNDER ANESTHESIA DEBRIDEMENT SUBACROMIAL DECOMPRESSION DISTAL CLAVICAL RESECTION ROTATOR CUFF REPAIR BICEPS  TENDINOSIS;  Surgeon: Sydnee Cabal, MD;  Location: [facility];  Service: Orthopedics;  Laterality: Left;  . SHOULDER ARTHROSCOPY WITH ROTATOR CUFF REPAIR Right 06/23/2019   Procedure: SHOULDER ARTHROSCOPY WITH ROTATOR CUFF REPAIR , biceps tenotomy;  Surgeon: Sydnee Cabal, MD;  Location: [facility];  Service: Orthopedics;  Laterality: Right;  . TONSILLECTOMY  AGE 35    There were no vitals  filed for this visit.  Subjective Assessment - 12/29/19 0849    Subjective  "I am feeling pretty good, I did work 25 hours this weekend. Now just really stiff"    Currently in Pain?  No/denies                       [facility] Adult PT Treatment/Exercise - 12/29/19 0001      Shoulder Exercises: Seated   Other Seated Exercises  Er abd 90 yellow band 2x10      Shoulder Exercises: Prone   Other Prone Exercises  3 point row 8lb horiz abe 2 lb 2x10,       Shoulder Exercises: Standing   Internal Rotation  Weights;Right;10 reps   x2   Internal Rotation Weight (lbs)  10    Flexion  Both;15 reps;Weights   x2   Shoulder Flexion Weight (lbs)  1    Extension  20 reps;Weights;Both    Extension Weight (lbs)  15    Other Standing Exercises  Tricep 45lb 2x15     Other Standing Exercises  # level cabinet reaches flex and abd x 10 each       Shoulder Exercises: ROM/Strengthening   UBE (Upper Arm Bike)  constant work 30 wats 2 min each     Other ROM/Strengthening Exercises  Lat pull downs 45lb 2x10  Shoulder Exercises: Stretch   Corner Stretch Limitations  R only mid and high     Other Shoulder Stretches  free throw simulation motion green ball 2x5               PT Short Term Goals - 07/03/19 1024      PT SHORT TERM GOAL #1   Title  independent with initial HEP    Status  Achieved        PT Long Term Goals - 12/21/19 0842      PT LONG TERM GOAL #1   Title  decrease pain 50%    Status  Achieved      PT LONG TERM GOAL #2   Title  AROM of the right shoulder to WFL's    Status  Partially Met      PT LONG TERM GOAL #3   Title  report no difficulty with dressing and doing hair    Status  Achieved      PT LONG TERM GOAL #4   Title  sleep without pain down the arm    Status  Achieved      PT LONG TERM GOAL #5   Title  lift 5# overhead with right arm    Status  On-going            Plan - 12/29/19 0927    Clinical Impression Statement  Pt reports  more fluid movements overall. Free throw simulation was difficult initially keeping elbow in with shoulder flexion. Open chain interventions remains her weak point, she has good strength with rowing at lat pull downs. Today's interventions did leave her RUE very tires post treatment.    Stability/Clinical Decision Making  Evolving/Moderate complexity    Rehab Potential  Good    PT Frequency  2x / week    PT Duration  8 weeks    PT Treatment/Interventions  ADLs/Self Care Home Management;Cryotherapy;Electrical Stimulation;Therapeutic activities;Therapeutic exercise;Patient/family education;Manual techniques;Vasopneumatic Device    PT Next Visit Plan  open chain interventions       Patient will benefit from skilled therapeutic intervention in order to improve the following deficits and impairments:     Visit Diagnosis: Acute pain of right shoulder  Stiffness of right shoulder, not elsewhere classified  Localized edema     Problem List Patient Active Problem List   Diagnosis Date Noted  . S/P rotator cuff repair 03/18/2014    Ronald G Pemberton, PTA 12/29/2019, 9:32 AM  [facility] [address] [place], [place], [address] Phone: [phone]   Fax:  [phone]  Name: Stacey Kelly MRN: [id number] Date of Birth: [birth year]   

## 2019-12-31 ENCOUNTER — Other Ambulatory Visit: Payer: Self-pay

## 2019-12-31 ENCOUNTER — Ambulatory Visit: Payer: PRIVATE HEALTH INSURANCE | Admitting: Physical Therapy

## 2019-12-31 ENCOUNTER — Encounter: Payer: Self-pay | Admitting: Physical Therapy

## 2019-12-31 DIAGNOSIS — R600 Localized edema: Secondary | ICD-10-CM

## 2019-12-31 DIAGNOSIS — M25511 Pain in right shoulder: Secondary | ICD-10-CM | POA: Diagnosis not present

## 2019-12-31 DIAGNOSIS — M25611 Stiffness of right shoulder, not elsewhere classified: Secondary | ICD-10-CM

## 2019-12-31 NOTE — Therapy (Signed)
[place], [place], [address] Phone: [phone]   Fax:  [phone]  Physical Therapy Treatment  Patient Details  Name: Stacey Kelly MRN: [id number] Date of Birth: 1956/08/10 Referring Provider (PT): RA Theda Sers   Encounter Date: 12/31/2019  PT End of Session - 12/31/19 1015    Visit Number  52    Authorization Type  W/C    PT Start Time  0936    PT Stop Time  1015    PT Time Calculation (min)  39 min    Activity Tolerance  Patient tolerated treatment well    Behavior During Therapy  [facility] for tasks assessed/performed       Past Medical History:  Diagnosis Date  . Depression   . Heart murmur    ASYMPTOMATIC--  ECHO  1995  MILD REGURG  . Migraine   . OA (osteoarthritis)    shoulders  . PONV (postoperative nausea and vomiting)   . Rotator cuff tear, right   . Vitamin D deficiency   . Wears glasses     Past Surgical History:  Procedure Laterality Date  . BUNIONECTOMY Right 2004   BOTH SIDES OF FOOT  . CRYOTHERAPY    . HARDWARE REMOVAL Right 04/15/2014   Procedure: EXCISION OF RIGHT FOOT DORSAL HARDWARE;  Surgeon: Wylene Simmer, MD;  Location: [place];  Service: Orthopedics;  Laterality: Right;  . RIGHT SHOULDER SURGERY  X3  LAST ONE 2010   INCLUDING ROTATOR CUFF REPAIR /  RECONSTRUCTION  . SHOULDER ARTHROSCOPY WITH ROTATOR CUFF REPAIR Left 03/18/2014   Procedure: LEFT SHOULDER ARTHROSCOPY WITH EVALUATION UNDER ANESTHESIA DEBRIDEMENT SUBACROMIAL DECOMPRESSION DISTAL CLAVICAL RESECTION ROTATOR CUFF REPAIR BICEPS  TENDINOSIS;  Surgeon: Sydnee Cabal, MD;  Location: [place];  Service: Orthopedics;  Laterality: Left;  . SHOULDER ARTHROSCOPY WITH ROTATOR CUFF REPAIR Right 06/23/2019   Procedure: SHOULDER ARTHROSCOPY WITH ROTATOR CUFF REPAIR , biceps tenotomy;  Surgeon: Sydnee Cabal, MD;  Location: [facility];  Service: Orthopedics;  Laterality:  Right;  . TONSILLECTOMY  AGE 64    There were no vitals filed for this visit.  Subjective Assessment - 12/31/19 0937    Subjective  "Like always, just stiff, always a 1"    Currently in Pain?  Yes    Pain Score  1                        [facility] Adult PT Treatment/Exercise - 12/31/19 0001      Shoulder Exercises: Seated   Other Seated Exercises  ER abd 90 yellow band 2x10    Other Seated Exercises  Seated OHP 1lb 2x10       Shoulder Exercises: Prone   Other Prone Exercises  3 point row 8lb; horiz abe 2 lb 3x10,       Shoulder Exercises: Standing   Internal Rotation  Weights;Right;10 reps   x2   Internal Rotation Weight (lbs)  10    Flexion  Both;15 reps;Weights    Shoulder Flexion Weight (lbs)  1    Extension  20 reps;Weights;Both    Extension Weight (lbs)  15    Other Standing Exercises  Tricep 45lb 3x10; Hammer curls 7lb 2x10     Other Standing Exercises  Abd to horiz add 2x10       Shoulder Exercises: ROM/Strengthening   UBE (Upper Arm Bike)  constant work 15 wats 2 min each  Manual Therapy   Manual Therapy  Soft tissue mobilization;Myofascial release    Passive ROM  Passive R shoulder flex keeping elbow in manual manipulation of the scapula to stretch lat and teres               PT Short Term Goals - 07/03/19 1024      PT SHORT TERM GOAL #1   Title  independent with initial HEP    Status  Achieved        PT Long Term Goals - 12/21/19 9794      PT LONG TERM GOAL #1   Title  decrease pain 50%    Status  Achieved      PT LONG TERM GOAL #2   Title  AROM of the right shoulder to WFL's    Status  Partially Met      PT LONG TERM GOAL #3   Title  report no difficulty with dressing and doing hair    Status  Achieved      PT LONG TERM GOAL #4   Title  sleep without pain down the arm    Status  Achieved      PT LONG TERM GOAL #5   Title  lift 5# overhead with right arm    Status  On-going            Plan - 12/31/19 1017     Clinical Impression Statement  No pain only stiff. She did well overall. R shoulder fatigue noted with 3 point rows and adduction. Some weakness and struggle with seated OHP under light resistance. R shoulder elevation noted with flexion and abduction. She continues to have weakness with arm away from body,    Stability/Clinical Decision Making  Evolving/Moderate complexity    Rehab Potential  Good    PT Frequency  2x / week    PT Duration  8 weeks    PT Treatment/Interventions  ADLs/Self Care Home Management;Cryotherapy;Electrical Stimulation;Therapeutic activities;Therapeutic exercise;Patient/family education;Manual techniques;Vasopneumatic Device    PT Next Visit Plan  open chain interventions       Patient will benefit from skilled therapeutic intervention in order to improve the following deficits and impairments:  Pain, Postural dysfunction, Increased muscle spasms, Decreased scar mobility, Decreased activity tolerance, Decreased range of motion, Decreased strength, Impaired UE functional use, Impaired flexibility, Increased edema  Visit Diagnosis: Acute pain of right shoulder  Localized edema  Stiffness of right shoulder, not elsewhere classified     Problem List Patient Active Problem List   Diagnosis Date Noted  . S/P rotator cuff repair 03/18/2014    Scot Jun, PTA 12/31/2019, 10:20 AM  [place], [place], [address] Phone: [phone]   Fax:  [phone]  Name: Stacey Kelly MRN: [id number] Date of Birth: 05/12/1956

## 2020-01-01 ENCOUNTER — Ambulatory Visit: Payer: PRIVATE HEALTH INSURANCE | Admitting: Physical Therapy

## 2020-01-05 ENCOUNTER — Encounter: Payer: Self-pay | Admitting: Physical Therapy

## 2020-01-05 ENCOUNTER — Ambulatory Visit: Payer: PRIVATE HEALTH INSURANCE | Attending: Specialist | Admitting: Physical Therapy

## 2020-01-05 ENCOUNTER — Other Ambulatory Visit: Payer: Self-pay

## 2020-01-05 DIAGNOSIS — M25511 Pain in right shoulder: Secondary | ICD-10-CM | POA: Diagnosis present

## 2020-01-05 DIAGNOSIS — M25611 Stiffness of right shoulder, not elsewhere classified: Secondary | ICD-10-CM | POA: Insufficient documentation

## 2020-01-05 DIAGNOSIS — R600 Localized edema: Secondary | ICD-10-CM | POA: Insufficient documentation

## 2020-01-05 NOTE — Therapy (Signed)
[place], [place], [address] Phone: [phone]   Fax:  [phone]  Physical Therapy Treatment  Patient Details  Name: Stacey Kelly MRN: [id number] Date of Birth: 62-09-1956 Referring Provider (PT): RA Theda Sers   Encounter Date: 01/05/2020  PT End of Session - 01/05/20 0844    Visit Number  22    Authorization Type  W/C    PT Start Time  0800    PT Stop Time  0845    PT Time Calculation (min)  45 min    Activity Tolerance  Patient tolerated treatment well    Behavior During Therapy  [facility] for tasks assessed/performed       Past Medical History:  Diagnosis Date  . Depression   . Heart murmur    ASYMPTOMATIC--  ECHO  1995  MILD REGURG  . Migraine   . OA (osteoarthritis)    shoulders  . PONV (postoperative nausea and vomiting)   . Rotator cuff tear, right   . Vitamin D deficiency   . Wears glasses     Past Surgical History:  Procedure Laterality Date  . BUNIONECTOMY Right 2004   BOTH SIDES OF FOOT  . CRYOTHERAPY    . HARDWARE REMOVAL Right 04/15/2014   Procedure: EXCISION OF RIGHT FOOT DORSAL HARDWARE;  Surgeon: Wylene Simmer, MD;  Location: [place];  Service: Orthopedics;  Laterality: Right;  . RIGHT SHOULDER SURGERY  X3  LAST ONE 2010   INCLUDING ROTATOR CUFF REPAIR /  RECONSTRUCTION  . SHOULDER ARTHROSCOPY WITH ROTATOR CUFF REPAIR Left 03/18/2014   Procedure: LEFT SHOULDER ARTHROSCOPY WITH EVALUATION UNDER ANESTHESIA DEBRIDEMENT SUBACROMIAL DECOMPRESSION DISTAL CLAVICAL RESECTION ROTATOR CUFF REPAIR BICEPS  TENDINOSIS;  Surgeon: Sydnee Cabal, MD;  Location: [place];  Service: Orthopedics;  Laterality: Left;  . SHOULDER ARTHROSCOPY WITH ROTATOR CUFF REPAIR Right 06/23/2019   Procedure: SHOULDER ARTHROSCOPY WITH ROTATOR CUFF REPAIR , biceps tenotomy;  Surgeon: Sydnee Cabal, MD;  Location: [facility];  Service: Orthopedics;  Laterality:  Right;  . TONSILLECTOMY  AGE 64    There were no vitals filed for this visit.  Subjective Assessment - 01/05/20 0804    Subjective  "I feel good, nothing hurt over the weekend"    Currently in Pain?  No/denies         [facility] PT Assessment - 01/05/20 0001      Assessment   Referring Provider (PT)  RA Collins    Onset Date/Surgical Date  06/23/19    Hand Dominance  Right    Next MD Visit  02/01/20      AROM   Right Shoulder Flexion  180 Degrees    Right Shoulder ABduction  174 Degrees    Right Shoulder Internal Rotation  50 Degrees    Right Shoulder External Rotation  90 Degrees                   [facility] Adult PT Treatment/Exercise - 01/05/20 0001      Shoulder Exercises: Seated   Other Seated Exercises  Seated OHP 1lb 2x10       Shoulder Exercises: Prone   Other Prone Exercises  3 point row 10lb; horiz abd 2 lb, 1lb flex 3x10,       Shoulder Exercises: Standing   Other Standing Exercises  Abd to horiz add 2x10       Shoulder Exercises: ROM/Strengthening   UBE (Upper Arm Bike)  constant work 20 watts 2 min each     Other ROM/Strengthening Exercises  Rows 55lb Lats 45lb 2x10     Other ROM/Strengthening Exercises  chest press 10lb 2x10      Shoulder Exercises: Stretch   Other Shoulder Stretches  free throw simulation motion green ball 2x5      Manual Therapy   Manual Therapy  Soft tissue mobilization;Myofascial release    Passive ROM  Passive R shoulder flex keeping elbow, Internal rotation keeping shoulder down               PT Short Term Goals - 07/03/19 1024      PT SHORT TERM GOAL #1   Title  independent with initial HEP    Status  Achieved        PT Long Term Goals - 12/21/19 0842      PT LONG TERM GOAL #1   Title  decrease pain 50%    Status  Achieved      PT LONG TERM GOAL #2   Title  AROM of the right shoulder to WFL's    Status  Partially Met      PT LONG TERM GOAL #3   Title  report no difficulty with dressing and doing  hair    Status  Achieved      PT LONG TERM GOAL #4   Title  sleep without pain down the arm    Status  Achieved      PT LONG TERM GOAL #5   Title  lift 5# overhead with right arm    Status  On-going            Plan - 01/05/20 0848    Clinical Impression Statement  Pt with some abduction ROM improvement. R shoulder elevation noted with flexion and abduction. Internal rotation remains her biggest ROM limitation. No reports of increase pain. Good strength with rows and lats. Weakness with open chain interventions remains.    Stability/Clinical Decision Making  Evolving/Moderate complexity    Rehab Potential  Good    PT Frequency  2x / week    PT Duration  8 weeks    PT Treatment/Interventions  ADLs/Self Care Home Management;Cryotherapy;Electrical Stimulation;Therapeutic activities;Therapeutic exercise;Patient/family education;Manual techniques;Vasopneumatic Device    PT Next Visit Plan  open chain interventions       Patient will benefit from skilled therapeutic intervention in order to improve the following deficits and impairments:  Pain, Postural dysfunction, Increased muscle spasms, Decreased scar mobility, Decreased activity tolerance, Decreased range of motion, Decreased strength, Impaired UE functional use, Impaired flexibility, Increased edema  Visit Diagnosis: Acute pain of right shoulder  Localized edema  Stiffness of right shoulder, not elsewhere classified     Problem List Patient Active Problem List   Diagnosis Date Noted  . S/P rotator cuff repair 03/18/2014    Ronald G Pemberton, PTA 01/05/2020, 8:51 AM  [facility] [address] [place], [place], [address] Phone: [phone]   Fax:  [phone]  Name: Stacey Kelly MRN: [id number] Date of Birth: 62/16/1957   

## 2020-01-08 ENCOUNTER — Encounter: Payer: Self-pay | Admitting: Physical Therapy

## 2020-01-08 ENCOUNTER — Other Ambulatory Visit: Payer: Self-pay

## 2020-01-08 ENCOUNTER — Ambulatory Visit: Payer: PRIVATE HEALTH INSURANCE | Attending: Specialist | Admitting: Physical Therapy

## 2020-01-08 DIAGNOSIS — M25511 Pain in right shoulder: Secondary | ICD-10-CM

## 2020-01-08 DIAGNOSIS — R600 Localized edema: Secondary | ICD-10-CM

## 2020-01-08 DIAGNOSIS — M25611 Stiffness of right shoulder, not elsewhere classified: Secondary | ICD-10-CM

## 2020-01-08 NOTE — Therapy (Signed)
Stacey Kelly, [place], [address] Phone: [phone]   Fax:  [phone]  Physical Therapy Treatment  Patient Details  Name: Stacey [place] MRN: [id number] Date of Birth: [birth year] Referring Provider (PT): RA Theda Sers   Encounter Date: 01/08/2020  PT End of Session - 01/08/20 0928    Visit Number  55    Authorization Type  W/C    PT Start Time  0850    PT Stop Time  0930    PT Time Calculation (min)  40 min    Activity Tolerance  Patient tolerated treatment well    Behavior During Therapy  [facility] for tasks assessed/performed       Past Medical History:  Diagnosis Date  . Depression   . Heart murmur    ASYMPTOMATIC--  ECHO  1995  MILD REGURG  . Migraine   . OA (osteoarthritis)    shoulders  . PONV (postoperative nausea and vomiting)   . Rotator cuff tear, right   . Vitamin D deficiency   . Wears glasses     Past Surgical History:  Procedure Laterality Date  . BUNIONECTOMY Right 2004   BOTH SIDES OF FOOT  . CRYOTHERAPY    . HARDWARE REMOVAL Right 04/15/2014   Procedure: EXCISION OF RIGHT FOOT DORSAL HARDWARE;  Surgeon: Wylene Simmer, MD;  Location: [place];  Service: Orthopedics;  Laterality: Right;  . RIGHT SHOULDER SURGERY  X3  LAST ONE 2010   INCLUDING ROTATOR CUFF REPAIR /  RECONSTRUCTION  . SHOULDER ARTHROSCOPY WITH ROTATOR CUFF REPAIR Left 03/18/2014   Procedure: LEFT SHOULDER ARTHROSCOPY WITH EVALUATION UNDER ANESTHESIA DEBRIDEMENT SUBACROMIAL DECOMPRESSION DISTAL CLAVICAL RESECTION ROTATOR CUFF REPAIR BICEPS  TENDINOSIS;  Surgeon: Sydnee Cabal, MD;  Location: [place];  Service: Orthopedics;  Laterality: Left;  . SHOULDER ARTHROSCOPY WITH ROTATOR CUFF REPAIR Right 06/23/2019   Procedure: SHOULDER ARTHROSCOPY WITH ROTATOR CUFF REPAIR , biceps tenotomy;  Surgeon: Sydnee Cabal, MD;  Location: [facility];  Service: Orthopedics;   Laterality: Right;  . TONSILLECTOMY  AGE 64    There were no vitals filed for this visit.  Subjective Assessment - 01/08/20 0852    Subjective  No pain just stiff    Currently in Pain?  No/denies                       [facility] Adult PT Treatment/Exercise - 01/08/20 0001      Shoulder Exercises: Prone   Other Prone Exercises  3 point row 10lb; horiz abd 2 lb, 1lb flex x10,       Shoulder Exercises: Standing   External Rotation  Right;Theraband;Strengthening;15 reps   x2   Theraband Level (Shoulder External Rotation)  Level 2 (Red)    Internal Rotation  Weights;Right;15 reps   x2   Extension  Weights;Both;15 reps   x2   Extension Weight (lbs)  15    Other Standing Exercises  Drip catch light ball x 8 with full flex, Then with green ball x5 x3     Other Standing Exercises  Triceps ext 45lb 2x15      Shoulder Exercises: ROM/Strengthening   UBE (Upper Arm Bike)  constant work 25 watts 3 min each     Other ROM/Strengthening Exercises  Rows 55lb 2x10                PT Short Term Goals - 07/03/19 1024  PT SHORT TERM GOAL #1   Title  independent with initial HEP    Status  Achieved        PT Long Term Goals - 12/21/19 0842      PT LONG TERM GOAL #1   Title  decrease pain 50%    Status  Achieved      PT LONG TERM GOAL #2   Title  AROM of the right shoulder to WFL's    Status  Partially Met      PT LONG TERM GOAL #3   Title  report no difficulty with dressing and doing hair    Status  Achieved      PT LONG TERM GOAL #4   Title  sleep without pain down the arm    Status  Achieved      PT LONG TERM GOAL #5   Title  lift 5# overhead with right arm    Status  On-going            Plan - 01/08/20 0929    Clinical Impression Statement  Stiffness reported overall, pt does fatigue really quick with R shoulder flexion even without no load. no reports of pain. Tactile cues needed for arm position during external rotation. She id doing a lot  better keeping elbow in with flexion. R shoulder protraction noted with passive internal rotation.    Stability/Clinical Decision Making  Evolving/Moderate complexity    Rehab Potential  Good    PT Frequency  2x / week    PT Treatment/Interventions  ADLs/Self Care Home Management;Cryotherapy;Electrical Stimulation;Therapeutic activities;Therapeutic exercise;Patient/family education;Manual techniques;Vasopneumatic Device    PT Next Visit Plan  open chain interventions       Patient will benefit from skilled therapeutic intervention in order to improve the following deficits and impairments:  Pain, Postural dysfunction, Increased muscle spasms, Decreased scar mobility, Decreased activity tolerance, Decreased range of motion, Decreased strength, Impaired UE functional use, Impaired flexibility, Increased edema  Visit Diagnosis: Stiffness of right shoulder, not elsewhere classified  Localized edema  Acute pain of right shoulder     Problem List Patient Active Problem List   Diagnosis Date Noted  . S/P rotator cuff repair 03/18/2014    Scot Jun, PTA 01/08/2020, 9:32 AM  [place] [address] [place], [place], [address] Phone: [phone]   Fax:  [phone]  Name: Stacey [place] MRN: [id number] Date of Birth: [birth year]

## 2020-01-11 ENCOUNTER — Other Ambulatory Visit: Payer: Self-pay

## 2020-01-11 ENCOUNTER — Encounter: Payer: Self-pay | Admitting: Physical Therapy

## 2020-01-11 ENCOUNTER — Ambulatory Visit: Payer: PRIVATE HEALTH INSURANCE | Attending: Specialist | Admitting: Physical Therapy

## 2020-01-11 DIAGNOSIS — M25611 Stiffness of right shoulder, not elsewhere classified: Secondary | ICD-10-CM | POA: Diagnosis present

## 2020-01-11 DIAGNOSIS — M25511 Pain in right shoulder: Secondary | ICD-10-CM

## 2020-01-11 DIAGNOSIS — R600 Localized edema: Secondary | ICD-10-CM | POA: Diagnosis present

## 2020-01-11 NOTE — Therapy (Signed)
[place], [place], [address] Phone: [phone]   Fax:  [phone]  Physical Therapy Treatment  Patient Details  Name: Stacey Kelly MRN: [id number] Date of Birth: 1956-10-24 Referring Provider (PT): RA Theda Sers   Encounter Date: 01/11/2020  PT End of Session - 01/11/20 0928    Visit Number  70    Authorization Type  W/C    PT Start Time  0850    PT Stop Time  0930    PT Time Calculation (min)  40 min    Activity Tolerance  Patient tolerated treatment well    Behavior During Therapy  [facility] for tasks assessed/performed       Past Medical History:  Diagnosis Date  . Depression   . Heart murmur    ASYMPTOMATIC--  ECHO  1995  MILD REGURG  . Migraine   . OA (osteoarthritis)    shoulders  . PONV (postoperative nausea and vomiting)   . Rotator cuff tear, right   . Vitamin D deficiency   . Wears glasses     Past Surgical History:  Procedure Laterality Date  . BUNIONECTOMY Right 2004   BOTH SIDES OF FOOT  . CRYOTHERAPY    . HARDWARE REMOVAL Right 04/15/2014   Procedure: EXCISION OF RIGHT FOOT DORSAL HARDWARE;  Surgeon: Wylene Simmer, MD;  Location: [place];  Service: Orthopedics;  Laterality: Right;  . RIGHT SHOULDER SURGERY  X3  LAST ONE 2010   INCLUDING ROTATOR CUFF REPAIR /  RECONSTRUCTION  . SHOULDER ARTHROSCOPY WITH ROTATOR CUFF REPAIR Left 03/18/2014   Procedure: LEFT SHOULDER ARTHROSCOPY WITH EVALUATION UNDER ANESTHESIA DEBRIDEMENT SUBACROMIAL DECOMPRESSION DISTAL CLAVICAL RESECTION ROTATOR CUFF REPAIR BICEPS  TENDINOSIS;  Surgeon: Sydnee Cabal, MD;  Location: [place];  Service: Orthopedics;  Laterality: Left;  . SHOULDER ARTHROSCOPY WITH ROTATOR CUFF REPAIR Right 06/23/2019   Procedure: SHOULDER ARTHROSCOPY WITH ROTATOR CUFF REPAIR , biceps tenotomy;  Surgeon: Sydnee Cabal, MD;  Location: [facility];  Service: Orthopedics;   Laterality: Right;  . TONSILLECTOMY  AGE 89    There were no vitals filed for this visit.  Subjective Assessment - 01/11/20 0852    Subjective  Tried to sleep on R side last night but could not, now she has a "Stripe" on the top of the shoulder. Just stiff today.    Currently in Pain?  No/denies         [facility] PT Assessment - 01/11/20 0001      Strength   Right Shoulder Flexion  3+/5    Right Shoulder ABduction  3+/5    Right Shoulder Internal Rotation  4+/5    Right Shoulder External Rotation  4/5                   [facility] Adult PT Treatment/Exercise - 01/11/20 0001      Shoulder Exercises: Standing   External Rotation  Right;Theraband;Strengthening;15 reps    Theraband Level (Shoulder External Rotation)  Level 1 (Yellow)    Internal Rotation  Weights;Right;15 reps   x2   Internal Rotation Weight (lbs)  10    Extension  Weights;Both;10 reps   x3   Extension Weight (lbs)  20    Other Standing Exercises  2nd to 3rd level cabinet green flex ball RUE 2x10, Abd 1 & 2nd level    Other Standing Exercises  Triceps ext 45lb 2x15      Shoulder Exercises: ROM/Strengthening  UBE (Upper Arm Bike)  constant work 25 watts 3 min each     Other ROM/Strengthening Exercises  Rows 55lb 3x10, Lats 45 3x10     Other ROM/Strengthening Exercises  chest press 10lb 2x15      Shoulder Exercises: Stretch   Other Shoulder Stretches  free throw simulation motion green ball 2x5      Manual Therapy   Manual Therapy  Soft tissue mobilization;Passive ROM    Manual therapy comments  PROM taken to end range and held    Passive ROM  Passive R shoulder flex keeping elbow, Internal rotation keeping shoulder down               PT Short Term Goals - 07/03/19 1024      PT SHORT TERM GOAL #1   Title  independent with initial HEP    Status  Achieved        PT Long Term Goals - 12/21/19 5284      PT LONG TERM GOAL #1   Title  decrease pain 50%    Status  Achieved      PT LONG  TERM GOAL #2   Title  AROM of the right shoulder to WFL's    Status  Partially Met      PT LONG TERM GOAL #3   Title  report no difficulty with dressing and doing hair    Status  Achieved      PT LONG TERM GOAL #4   Title  sleep without pain down the arm    Status  Achieved      PT LONG TERM GOAL #5   Title  lift 5# overhead with right arm    Status  On-going            Plan - 01/11/20 0928    Clinical Impression Statement  Pt ~ 5 minutes late. She remains weak with motion while arm is extended away from body. She has progressed increasing her strength with ER & IR arms to side. Good strength with Rows and lats. Weakness with open chain activities.    Stability/Clinical Decision Making  Evolving/Moderate complexity    Rehab Potential  Good    PT Frequency  2x / week    PT Duration  8 weeks    PT Treatment/Interventions  ADLs/Self Care Home Management;Cryotherapy;Electrical Stimulation;Therapeutic activities;Therapeutic exercise;Patient/family education;Manual techniques;Vasopneumatic Device    PT Next Visit Plan  open chain interventions       Patient will benefit from skilled therapeutic intervention in order to improve the following deficits and impairments:  Pain, Postural dysfunction, Increased muscle spasms, Decreased scar mobility, Decreased activity tolerance, Decreased range of motion, Decreased strength, Impaired UE functional use, Impaired flexibility, Increased edema  Visit Diagnosis: Acute pain of right shoulder  Localized edema  Stiffness of right shoulder, not elsewhere classified     Problem List Patient Active Problem List   Diagnosis Date Noted  . S/P rotator cuff repair 03/18/2014    Scot Jun, PTA 01/11/2020, 9:30 AM  [place], [place], [address] Phone: [phone]   Fax:  [phone]  Name: Stacey [place] MRN: [id number] Date of Birth:  [birth year]

## 2020-01-14 ENCOUNTER — Encounter: Payer: Self-pay | Admitting: Physical Therapy

## 2020-01-14 ENCOUNTER — Other Ambulatory Visit: Payer: Self-pay

## 2020-01-14 ENCOUNTER — Ambulatory Visit: Payer: PRIVATE HEALTH INSURANCE | Admitting: Physical Therapy

## 2020-01-14 DIAGNOSIS — R600 Localized edema: Secondary | ICD-10-CM

## 2020-01-14 DIAGNOSIS — M25511 Pain in right shoulder: Secondary | ICD-10-CM

## 2020-01-14 DIAGNOSIS — M25611 Stiffness of right shoulder, not elsewhere classified: Secondary | ICD-10-CM

## 2020-01-14 NOTE — Therapy (Signed)
[place], [place], [address] Phone: [phone]   Fax:  [phone]  Physical Therapy Treatment  Patient Details  Name: Stacey Kelly MRN: [id number] Date of Birth: [birth year] Referring Provider (PT): RA Theda Sers   Encounter Date: 01/14/2020  PT End of Session - 01/14/20 0841    Visit Number  64    Authorization Type  W/C    PT Start Time  0800    PT Stop Time  0842    PT Time Calculation (min)  42 min    Activity Tolerance  Patient tolerated treatment well    Behavior During Therapy  [facility] for tasks assessed/performed       Past Medical History:  Diagnosis Date  . Depression   . Heart murmur    ASYMPTOMATIC--  ECHO  1995  MILD REGURG  . Migraine   . OA (osteoarthritis)    shoulders  . PONV (postoperative nausea and vomiting)   . Rotator cuff tear, right   . Vitamin D deficiency   . Wears glasses     Past Surgical History:  Procedure Laterality Date  . BUNIONECTOMY Right 2004   BOTH SIDES OF FOOT  . CRYOTHERAPY    . HARDWARE REMOVAL Right 04/15/2014   Procedure: EXCISION OF RIGHT FOOT DORSAL HARDWARE;  Surgeon: Wylene Simmer, MD;  Location: [place];  Service: Orthopedics;  Laterality: Right;  . RIGHT SHOULDER SURGERY  X3  LAST ONE 2010   INCLUDING ROTATOR CUFF REPAIR /  RECONSTRUCTION  . SHOULDER ARTHROSCOPY WITH ROTATOR CUFF REPAIR Left 03/18/2014   Procedure: LEFT SHOULDER ARTHROSCOPY WITH EVALUATION UNDER ANESTHESIA DEBRIDEMENT SUBACROMIAL DECOMPRESSION DISTAL CLAVICAL RESECTION ROTATOR CUFF REPAIR BICEPS  TENDINOSIS;  Surgeon: Sydnee Cabal, MD;  Location: [place];  Service: Orthopedics;  Laterality: Left;  . SHOULDER ARTHROSCOPY WITH ROTATOR CUFF REPAIR Right 06/23/2019   Procedure: SHOULDER ARTHROSCOPY WITH ROTATOR CUFF REPAIR , biceps tenotomy;  Surgeon: Sydnee Cabal, MD;  Location: [facility];  Service: Orthopedics;   Laterality: Right;  . TONSILLECTOMY  AGE 33    There were no vitals filed for this visit.  Subjective Assessment - 01/14/20 0802    Subjective  "Shoulder is fine, but my neck hurt" Got a massage    Currently in Pain?  No/denies                       [facility] Adult PT Treatment/Exercise - 01/14/20 0001      Shoulder Exercises: Seated   Other Seated Exercises  RUE ER shoulder on Pball abd 90 2lb 2x10      Shoulder Exercises: Prone   Other Prone Exercises  3 point row 10lb; horiz abd 3 lb, 2lb flex, Kick backs 4lb 2x15,       Shoulder Exercises: Standing   Flexion  15 reps;Strengthening;Right    Other Standing Exercises  Drip catch light ball x 8 with full flex, OHP 2lb 2x10     Other Standing Exercises  Triceps ext 45lb 2x15      Shoulder Exercises: ROM/Strengthening   UBE (Upper Arm Bike)  constant work 25 watts 3 min each     Other ROM/Strengthening Exercises  Rows 55lb 3x10, Lats 45 2x15     Other ROM/Strengthening Exercises  chest press 15lb 2x15               PT Short Term Goals - 07/03/19 1024  PT SHORT TERM GOAL #1   Title  independent with initial HEP    Status  Achieved        PT Long Term Goals - 12/21/19 0842      PT LONG TERM GOAL #1   Title  decrease pain 50%    Status  Achieved      PT LONG TERM GOAL #2   Title  AROM of the right shoulder to WFL's    Status  Partially Met      PT LONG TERM GOAL #3   Title  report no difficulty with dressing and doing hair    Status  Achieved      PT LONG TERM GOAL #4   Title  sleep without pain down the arm    Status  Achieved      PT LONG TERM GOAL #5   Title  lift 5# overhead with right arm    Status  On-going            Plan - 01/14/20 7416    Clinical Impression Statement  Pt did well overall today, completed all exercises in the 3 point position some difficulty with flexion due to weakness. She remains very weak with activities away from body with R arm. Tactile assist  with ER to keep shoulder from rolling back. No reports of pain during today's session    Stability/Clinical Decision Making  Evolving/Moderate complexity    Rehab Potential  Good    PT Frequency  2x / week    PT Duration  8 weeks    PT Treatment/Interventions  ADLs/Self Care Home Management;Cryotherapy;Electrical Stimulation;Therapeutic activities;Therapeutic exercise;Patient/family education;Manual techniques;Vasopneumatic Device    PT Next Visit Plan  open chain interventions       Patient will benefit from skilled therapeutic intervention in order to improve the following deficits and impairments:  Pain, Postural dysfunction, Increased muscle spasms, Decreased scar mobility, Decreased activity tolerance, Decreased range of motion, Decreased strength, Impaired UE functional use, Impaired flexibility, Increased edema  Visit Diagnosis: Localized edema  Stiffness of right shoulder, not elsewhere classified  Acute pain of right shoulder     Problem List Patient Active Problem List   Diagnosis Date Noted  . S/P rotator cuff repair 03/18/2014    Scot Jun, PTA 01/14/2020, 8:49 AM  [place] [address] [place], [place], [address] Phone: [phone]   Fax:  [phone]  Name: Stacey Kelly MRN: [id number] Date of Birth: 62/18/1957

## 2020-01-25 ENCOUNTER — Encounter: Payer: Self-pay | Admitting: Physical Therapy

## 2020-01-25 ENCOUNTER — Other Ambulatory Visit: Payer: Self-pay

## 2020-01-25 ENCOUNTER — Ambulatory Visit: Payer: PRIVATE HEALTH INSURANCE | Admitting: Physical Therapy

## 2020-01-25 DIAGNOSIS — R600 Localized edema: Secondary | ICD-10-CM

## 2020-01-25 DIAGNOSIS — M25611 Stiffness of right shoulder, not elsewhere classified: Secondary | ICD-10-CM

## 2020-01-25 DIAGNOSIS — M25511 Pain in right shoulder: Secondary | ICD-10-CM | POA: Diagnosis not present

## 2020-01-25 NOTE — Therapy (Signed)
[place], [place], [address] Phone: [phone]   Fax:  [phone]  Physical Therapy Treatment  Patient Details  Name: Stacey Kelly MRN: [id number] Date of Birth: January 01, 1956 Referring Provider (PT): RA Theda Sers   Encounter Date: 01/25/2020  PT End of Session - 01/25/20 0928    Visit Number  14    Authorization Type  W/C    PT Start Time  0845    PT Stop Time  0928    PT Time Calculation (min)  43 min    Activity Tolerance  Patient tolerated treatment well    Behavior During Therapy  [facility] for tasks assessed/performed       Past Medical History:  Diagnosis Date  . Depression   . Heart murmur    ASYMPTOMATIC--  ECHO  1995  MILD REGURG  . Migraine   . OA (osteoarthritis)    shoulders  . PONV (postoperative nausea and vomiting)   . Rotator cuff tear, right   . Vitamin D deficiency   . Wears glasses     Past Surgical History:  Procedure Laterality Date  . BUNIONECTOMY Right 2004   BOTH SIDES OF FOOT  . CRYOTHERAPY    . HARDWARE REMOVAL Right 04/15/2014   Procedure: EXCISION OF RIGHT FOOT DORSAL HARDWARE;  Surgeon: Wylene Simmer, MD;  Location: [place];  Service: Orthopedics;  Laterality: Right;  . RIGHT SHOULDER SURGERY  X3  LAST ONE 2010   INCLUDING ROTATOR CUFF REPAIR /  RECONSTRUCTION  . SHOULDER ARTHROSCOPY WITH ROTATOR CUFF REPAIR Left 03/18/2014   Procedure: LEFT SHOULDER ARTHROSCOPY WITH EVALUATION UNDER ANESTHESIA DEBRIDEMENT SUBACROMIAL DECOMPRESSION DISTAL CLAVICAL RESECTION ROTATOR CUFF REPAIR BICEPS  TENDINOSIS;  Surgeon: Sydnee Cabal, MD;  Location: [place];  Service: Orthopedics;  Laterality: Left;  . SHOULDER ARTHROSCOPY WITH ROTATOR CUFF REPAIR Right 06/23/2019   Procedure: SHOULDER ARTHROSCOPY WITH ROTATOR CUFF REPAIR , biceps tenotomy;  Surgeon: Sydnee Cabal, MD;  Location: [facility];  Service: Orthopedics;   Laterality: Right;  . TONSILLECTOMY  AGE 64    There were no vitals filed for this visit.  Subjective Assessment - 01/25/20 0846    Subjective  Was on vacation for a week, doing good, was able to workout every other day    Currently in Pain?  No/denies         [facility] PT Assessment - 01/25/20 0001      Assessment   Referring Provider (PT)  RA Collins    Onset Date/Surgical Date  06/23/19    Hand Dominance  Right    Next MD Visit  02/01/20      AROM   Right Shoulder Flexion  180 Degrees    Right Shoulder ABduction  175 Degrees    Right Shoulder Internal Rotation  53 Degrees    Right Shoulder External Rotation  90 Degrees                   [facility] Adult PT Treatment/Exercise - 01/25/20 0001      Shoulder Exercises: Standing   Extension  Weights;Both;15 reps   x2   Extension Weight (lbs)  15    Other Standing Exercises  Drip catch light ball x15, OHP 2lb 2x10      Other Standing Exercises  5lb cable rear flys 2x10       Shoulder Exercises: ROM/Strengthening   UBE (Upper Arm Bike)  constant work 25 watts  3 min each     Other ROM/Strengthening Exercises  Rows 55lb 2x15, Lats 45 2x15     Other ROM/Strengthening Exercises  chest press 20lb 2x15      Shoulder Exercises: Stretch   Other Shoulder Stretches  free throw simulation motion green ball 2x5      Manual Therapy   Manual Therapy  Soft tissue mobilization;Passive ROM    Manual therapy comments  PROM taken to end range and held               PT Short Term Goals - 07/03/19 1024      PT SHORT TERM GOAL #1   Title  independent with initial HEP    Status  Achieved        PT Long Term Goals - 12/21/19 5697      PT LONG TERM GOAL #1   Title  decrease pain 50%    Status  Achieved      PT LONG TERM GOAL #2   Title  AROM of the right shoulder to WFL's    Status  Partially Met      PT LONG TERM GOAL #3   Title  report no difficulty with dressing and doing hair    Status  Achieved      PT  LONG TERM GOAL #4   Title  sleep without pain down the arm    Status  Achieved      PT LONG TERM GOAL #5   Title  lift 5# overhead with right arm    Status  On-going            Plan - 01/25/20 0929    Clinical Impression Statement  Pt return to therapy after a week on vacation. ROM  remains goo overall but is lacking internal rotation with arm abducted to 90. Weakness still remains with arm is away from body. Good strength with closed chain pulling interventions. Some shoulder protrusion with passive internal rotation.    Stability/Clinical Decision Making  Evolving/Moderate complexity    Rehab Potential  Good    PT Frequency  2x / week    PT Duration  8 weeks    PT Treatment/Interventions  ADLs/Self Care Home Management;Cryotherapy;Electrical Stimulation;Therapeutic activities;Therapeutic exercise;Patient/family education;Manual techniques;Vasopneumatic Device    PT Next Visit Plan  open chain interventions       Patient will benefit from skilled therapeutic intervention in order to improve the following deficits and impairments:  Pain, Postural dysfunction, Increased muscle spasms, Decreased scar mobility, Decreased activity tolerance, Decreased range of motion, Decreased strength, Impaired UE functional use, Impaired flexibility, Increased edema  Visit Diagnosis: Acute pain of right shoulder  Stiffness of right shoulder, not elsewhere classified  Localized edema     Problem List Patient Active Problem List   Diagnosis Date Noted  . S/P rotator cuff repair 03/18/2014    Scot Jun, PTA 01/25/2020, 9:32 AM  [address] [place], [place], [address] Phone: [phone]   Fax:  [phone]  Name: Stacey Kelly MRN: [id number] Date of Birth: 08-16-1956

## 2020-01-26 DIAGNOSIS — Z0000 Encounter for general adult medical examination without abnormal findings: Secondary | ICD-10-CM | POA: Diagnosis not present

## 2020-01-26 DIAGNOSIS — E663 Overweight: Secondary | ICD-10-CM | POA: Diagnosis not present

## 2020-01-26 DIAGNOSIS — K5900 Constipation, unspecified: Secondary | ICD-10-CM | POA: Diagnosis not present

## 2020-01-26 DIAGNOSIS — J301 Allergic rhinitis due to pollen: Secondary | ICD-10-CM | POA: Diagnosis not present

## 2020-01-26 DIAGNOSIS — M8580 Other specified disorders of bone density and structure, unspecified site: Secondary | ICD-10-CM | POA: Diagnosis not present

## 2020-01-26 DIAGNOSIS — Z6827 Body mass index (BMI) 27.0-27.9, adult: Secondary | ICD-10-CM | POA: Diagnosis not present

## 2020-01-26 DIAGNOSIS — T65811D Toxic effect of latex, accidental (unintentional), subsequent encounter: Secondary | ICD-10-CM | POA: Diagnosis not present

## 2020-01-28 ENCOUNTER — Other Ambulatory Visit: Payer: Self-pay

## 2020-01-28 ENCOUNTER — Encounter: Payer: Self-pay | Admitting: Physical Therapy

## 2020-01-28 ENCOUNTER — Ambulatory Visit: Payer: PRIVATE HEALTH INSURANCE | Attending: Specialist | Admitting: Physical Therapy

## 2020-01-28 DIAGNOSIS — R600 Localized edema: Secondary | ICD-10-CM | POA: Diagnosis not present

## 2020-01-28 DIAGNOSIS — M25511 Pain in right shoulder: Secondary | ICD-10-CM | POA: Diagnosis present

## 2020-01-28 DIAGNOSIS — M25611 Stiffness of right shoulder, not elsewhere classified: Secondary | ICD-10-CM | POA: Insufficient documentation

## 2020-01-28 NOTE — Therapy (Signed)
[place], [place], [address] Phone: [phone]   Fax:  [phone]  Physical Therapy Treatment  Patient Details  Name: Stacey Kelly MRN: [id number] Date of Birth: 12-19-1955 Referring Provider (PT): RA Theda Sers   Encounter Date: 01/28/2020  PT End of Session - 01/28/20 0841    Visit Number  72    Authorization Type  W/C    PT Stop Time  0843    Activity Tolerance  Patient tolerated treatment well    Behavior During Therapy  [facility] for tasks assessed/performed       Past Medical History:  Diagnosis Date  . Depression   . Heart murmur    ASYMPTOMATIC--  ECHO  1995  MILD REGURG  . Migraine   . OA (osteoarthritis)    shoulders  . PONV (postoperative nausea and vomiting)   . Rotator cuff tear, right   . Vitamin D deficiency   . Wears glasses     Past Surgical History:  Procedure Laterality Date  . BUNIONECTOMY Right 2004   BOTH SIDES OF FOOT  . CRYOTHERAPY    . HARDWARE REMOVAL Right 04/15/2014   Procedure: EXCISION OF RIGHT FOOT DORSAL HARDWARE;  Surgeon: Wylene Simmer, MD;  Location: [place];  Service: Orthopedics;  Laterality: Right;  . RIGHT SHOULDER SURGERY  X3  LAST ONE 2010   INCLUDING ROTATOR CUFF REPAIR /  RECONSTRUCTION  . SHOULDER ARTHROSCOPY WITH ROTATOR CUFF REPAIR Left 03/18/2014   Procedure: LEFT SHOULDER ARTHROSCOPY WITH EVALUATION UNDER ANESTHESIA DEBRIDEMENT SUBACROMIAL DECOMPRESSION DISTAL CLAVICAL RESECTION ROTATOR CUFF REPAIR BICEPS  TENDINOSIS;  Surgeon: Sydnee Cabal, MD;  Location: [place];  Service: Orthopedics;  Laterality: Left;  . SHOULDER ARTHROSCOPY WITH ROTATOR CUFF REPAIR Right 06/23/2019   Procedure: SHOULDER ARTHROSCOPY WITH ROTATOR CUFF REPAIR , biceps tenotomy;  Surgeon: Sydnee Cabal, MD;  Location: [facility];  Service: Orthopedics;  Laterality: Right;  . TONSILLECTOMY  AGE 64    There were no vitals  filed for this visit.  Subjective Assessment - 01/28/20 0807    Subjective  "Good just stiff"    Currently in Pain?  No/denies                       [facility] Adult PT Treatment/Exercise - 01/28/20 0001      Shoulder Exercises: Seated   Other Seated Exercises  OHP 2lb x10     Other Seated Exercises  RUE ER shoulder on Pball abd 90 2lb 2x10      Shoulder Exercises: Prone   Other Prone Exercises  3 point row 10lb; horiz abd 2 lb, 2lb flex, Kick backs 4lb 2x10       Shoulder Exercises: Standing   Flexion  Right;Weights;Strengthening;20 reps    Shoulder Flexion Weight (lbs)  --   green weighted ball    ABduction  Right;Theraband;20 reps    Shoulder ABduction Weight (lbs)  --   green weighted ball    Extension  Weights;Both;15 reps    Extension Weight (lbs)  20    Other Standing Exercises  5lb cable rear flys 2x10       Shoulder Exercises: ROM/Strengthening   UBE (Upper Arm Bike)  constant work 25 watts 3 min each     Other ROM/Strengthening Exercises  Rows 55lb 2x15, Lats 55 2x10     Other ROM/Strengthening Exercises  chest press 25lb 2x15  Shoulder Exercises: Stretch   Other Shoulder Stretches  free throw simulation motion green ball 2x5      Manual Therapy   Manual Therapy  Soft tissue mobilization;Passive ROM    Manual therapy comments  PROM taken to end range and held               PT Short Term Goals - 07/03/19 1024      PT SHORT TERM GOAL #1   Title  independent with initial HEP    Status  Achieved        PT Long Term Goals - 12/21/19 3568      PT LONG TERM GOAL #1   Title  decrease pain 50%    Status  Achieved      PT LONG TERM GOAL #2   Title  AROM of the right shoulder to WFL's    Status  Partially Met      PT LONG TERM GOAL #3   Title  report no difficulty with dressing and doing hair    Status  Achieved      PT LONG TERM GOAL #4   Title  sleep without pain down the arm    Status  Achieved      PT LONG TERM GOAL #5    Title  lift 5# overhead with right arm    Status  On-going            Plan - 01/28/20 0842    Clinical Impression Statement  Good strength remains with pulling interventions. Pt appears to struggle less today to open chain interventions with REU extended from body. Cues needed to retract R scapula with 3 point rows. Some difficulty with OHP    Stability/Clinical Decision Making  Evolving/Moderate complexity    Rehab Potential  Good    PT Frequency  2x / week    PT Duration  8 weeks    PT Treatment/Interventions  ADLs/Self Care Home Management;Cryotherapy;Electrical Stimulation;Therapeutic activities;Therapeutic exercise;Patient/family education;Manual techniques;Vasopneumatic Device    PT Next Visit Plan  open chain interventions       Patient will benefit from skilled therapeutic intervention in order to improve the following deficits and impairments:  Pain, Postural dysfunction, Increased muscle spasms, Decreased scar mobility, Decreased activity tolerance, Decreased range of motion, Decreased strength, Impaired UE functional use, Impaired flexibility, Increased edema  Visit Diagnosis: Localized edema  Stiffness of right shoulder, not elsewhere classified  Acute pain of right shoulder     Problem List Patient Active Problem List   Diagnosis Date Noted  . S/P rotator cuff repair 03/18/2014    Scot Jun, PTA 01/28/2020, 8:43 AM  [address] [place], [place], [address] Phone: [phone]   Fax:  [phone]  Name: Stacey Kelly MRN: [id number] Date of Birth: 07-30-1956

## 2020-02-01 ENCOUNTER — Encounter: Payer: Self-pay | Admitting: Physical Therapy

## 2020-02-01 ENCOUNTER — Ambulatory Visit: Payer: PRIVATE HEALTH INSURANCE | Admitting: Physical Therapy

## 2020-02-01 ENCOUNTER — Other Ambulatory Visit: Payer: Self-pay

## 2020-02-01 DIAGNOSIS — M25611 Stiffness of right shoulder, not elsewhere classified: Secondary | ICD-10-CM

## 2020-02-01 DIAGNOSIS — M25511 Pain in right shoulder: Secondary | ICD-10-CM

## 2020-02-01 DIAGNOSIS — R600 Localized edema: Secondary | ICD-10-CM | POA: Diagnosis not present

## 2020-02-01 NOTE — Therapy (Signed)
[place], [place], [address] Phone: [phone]   Fax:  [phone]  Physical Therapy Treatment  Patient Details  Name: Stacey Kelly MRN: [id number] Date of Birth: 09-Feb-1956 Referring Provider (PT): RA Theda Sers   Encounter Date: 02/01/2020  PT End of Session - 02/01/20 0919    Visit Number  26    Authorization Type  W/C    PT Start Time  0846    PT Stop Time  0930    PT Time Calculation (min)  44 min    Activity Tolerance  Patient tolerated treatment well    Behavior During Therapy  [facility] for tasks assessed/performed       Past Medical History:  Diagnosis Date  . Depression   . Heart murmur    ASYMPTOMATIC--  ECHO  1995  MILD REGURG  . Migraine   . OA (osteoarthritis)    shoulders  . PONV (postoperative nausea and vomiting)   . Rotator cuff tear, right   . Vitamin D deficiency   . Wears glasses     Past Surgical History:  Procedure Laterality Date  . BUNIONECTOMY Right 2004   BOTH SIDES OF FOOT  . CRYOTHERAPY    . HARDWARE REMOVAL Right 04/15/2014   Procedure: EXCISION OF RIGHT FOOT DORSAL HARDWARE;  Surgeon: Wylene Simmer, MD;  Location: [place];  Service: Orthopedics;  Laterality: Right;  . RIGHT SHOULDER SURGERY  X3  LAST ONE 2010   INCLUDING ROTATOR CUFF REPAIR /  RECONSTRUCTION  . SHOULDER ARTHROSCOPY WITH ROTATOR CUFF REPAIR Left 03/18/2014   Procedure: LEFT SHOULDER ARTHROSCOPY WITH EVALUATION UNDER ANESTHESIA DEBRIDEMENT SUBACROMIAL DECOMPRESSION DISTAL CLAVICAL RESECTION ROTATOR CUFF REPAIR BICEPS  TENDINOSIS;  Surgeon: Sydnee Cabal, MD;  Location: [place];  Service: Orthopedics;  Laterality: Left;  . SHOULDER ARTHROSCOPY WITH ROTATOR CUFF REPAIR Right 06/23/2019   Procedure: SHOULDER ARTHROSCOPY WITH ROTATOR CUFF REPAIR , biceps tenotomy;  Surgeon: Sydnee Cabal, MD;  Location: [facility];  Service: Orthopedics;  Laterality:  Right;  . TONSILLECTOMY  AGE 75    There were no vitals filed for this visit.  Subjective Assessment - 02/01/20 0849    Subjective  Patient reports that she is having less of the spasms in the right scaoular area.  Reports difficulty with some grocery lifting and some difficulty putting away dishes    Currently in Pain?  Yes    Pain Score  1     Pain Location  Shoulder    Pain Orientation  Right    Pain Descriptors / Indicators  Sore    Aggravating Factors   reaching overhead         [facility] PT Assessment - 02/01/20 0001      AROM   Overall AROM Comments  has crepitus in the shoulder with all reaching out and up above shoulder height but no pain    Right Shoulder Flexion  180 Degrees    Right Shoulder ABduction  175 Degrees    Right Shoulder Internal Rotation  50 Degrees   funtionally can reach bra, but is tight   Right Shoulder External Rotation  90 Degrees      Strength   Right Shoulder Flexion  3+/5    Right Shoulder ABduction  3+/5    Right Shoulder Internal Rotation  4+/5    Right Shoulder External Rotation  4/5  [facility] Adult PT Treatment/Exercise - 02/01/20 0001      Shoulder Exercises: Seated   Other Seated Exercises  ER 3# elbow on ball    Other Seated Exercises  5# bent over row, 3# bent over extension, 2# horizontal abduction      Shoulder Exercises: Prone   Other Prone Exercises  quadraped 6" alternating step ups with hands      Shoulder Exercises: Standing   Other Standing Exercises  3# eccentrics from wall slides, 2# eccentrics, cannot go below 90 degrees or she will have a lot more crepitus    Other Standing Exercises  5lb cable rear flys 2x10       Shoulder Exercises: ROM/Strengthening   UBE (Upper Arm Bike)  constant work 25 watts 3 min each     Other ROM/Strengthening Exercises  Rows 55lb 2x15, Lats 55 2x10     Other ROM/Strengthening Exercises  chest press 25lb 2x15      Manual Therapy   Manual Therapy  Passive ROM     Manual therapy comments  PROM taken to end range and held               PT Short Term Goals - 07/03/19 1024      PT SHORT TERM GOAL #1   Title  independent with initial HEP    Status  Achieved        PT Long Term Goals - 02/01/20 0926      PT LONG TERM GOAL #1   Title  decrease pain 50%    Status  Achieved      PT LONG TERM GOAL #2   Title  AROM of the right shoulder to WFL's    Status  Partially Met      PT LONG TERM GOAL #3   Title  report no difficulty with dressing and doing hair    Status  Achieved      PT LONG TERM GOAL #4   Title  sleep without pain down the arm    Status  Achieved      PT LONG TERM GOAL #5   Title  lift 5# overhead with right arm    Status  On-going            Plan - 02/01/20 0919    Clinical Impression Statement  Overall patient is doing better, she has good AROM, the biggest issue is strength especially over 90 degrees flexion and abduction.  Over head activities does cause crepitus, I try to avoid this but am starting to work on eccentrics but doing above 90 degrees due to coming down below 90 degrees seems to exacerbate the crepitus.  We have focused on scapbular stability and I think this has really helped her function    PT Next Visit Plan  will follow MD recommendations    Consulted and Agree with Plan of Care  Patient       Patient will benefit from skilled therapeutic intervention in order to improve the following deficits and impairments:  Pain, Postural dysfunction, Increased muscle spasms, Decreased scar mobility, Decreased activity tolerance, Decreased range of motion, Decreased strength, Impaired UE functional use, Impaired flexibility, Increased edema  Visit Diagnosis: Stiffness of right shoulder, not elsewhere classified  Acute pain of right shoulder     Problem List Patient Active Problem List   Diagnosis Date Noted  . S/P rotator cuff repair 03/18/2014    Sumner Boast., PT 02/01/2020, 9:27  AM  [facility]  [place], [place], [address] Phone: [phone]   Fax:  [phone]  Name: Stacey Kelly MRN: [id number] Date of Birth: Nov 20, 1955

## 2020-02-04 ENCOUNTER — Other Ambulatory Visit: Payer: Self-pay

## 2020-02-04 ENCOUNTER — Ambulatory Visit: Payer: PRIVATE HEALTH INSURANCE | Attending: Specialist | Admitting: Physical Therapy

## 2020-02-04 ENCOUNTER — Encounter: Payer: Self-pay | Admitting: Physical Therapy

## 2020-02-04 DIAGNOSIS — R600 Localized edema: Secondary | ICD-10-CM | POA: Diagnosis present

## 2020-02-04 DIAGNOSIS — M25511 Pain in right shoulder: Secondary | ICD-10-CM | POA: Diagnosis present

## 2020-02-04 DIAGNOSIS — M25611 Stiffness of right shoulder, not elsewhere classified: Secondary | ICD-10-CM | POA: Insufficient documentation

## 2020-02-04 NOTE — Therapy (Signed)
[place], [place], [address] Phone: [phone]   Fax:  [phone]  Physical Therapy Treatment  Patient Details  Name: Stacey Kelly MRN: [id number] Date of Birth: [birth year] Referring Provider (PT): RA Theda Sers   Encounter Date: 02/04/2020  PT End of Session - 02/04/20 0923    Visit Number  37    Authorization Type  W/C    PT Start Time  7026    PT Stop Time  0927    PT Time Calculation (min)  40 min    Activity Tolerance  Patient tolerated treatment well    Behavior During Therapy  [facility] for tasks assessed/performed       Past Medical History:  Diagnosis Date  . Depression   . Heart murmur    ASYMPTOMATIC--  ECHO  1995  MILD REGURG  . Migraine   . OA (osteoarthritis)    shoulders  . PONV (postoperative nausea and vomiting)   . Rotator cuff tear, right   . Vitamin D deficiency   . Wears glasses     Past Surgical History:  Procedure Laterality Date  . BUNIONECTOMY Right 2004   BOTH SIDES OF FOOT  . CRYOTHERAPY    . HARDWARE REMOVAL Right 04/15/2014   Procedure: EXCISION OF RIGHT FOOT DORSAL HARDWARE;  Surgeon: Wylene Simmer, MD;  Location: [place];  Service: Orthopedics;  Laterality: Right;  . RIGHT SHOULDER SURGERY  X3  LAST ONE 2010   INCLUDING ROTATOR CUFF REPAIR /  RECONSTRUCTION  . SHOULDER ARTHROSCOPY WITH ROTATOR CUFF REPAIR Left 03/18/2014   Procedure: LEFT SHOULDER ARTHROSCOPY WITH EVALUATION UNDER ANESTHESIA DEBRIDEMENT SUBACROMIAL DECOMPRESSION DISTAL CLAVICAL RESECTION ROTATOR CUFF REPAIR BICEPS  TENDINOSIS;  Surgeon: Sydnee Cabal, MD;  Location: [place];  Service: Orthopedics;  Laterality: Left;  . SHOULDER ARTHROSCOPY WITH ROTATOR CUFF REPAIR Right 06/23/2019   Procedure: SHOULDER ARTHROSCOPY WITH ROTATOR CUFF REPAIR , biceps tenotomy;  Surgeon: Sydnee Cabal, MD;  Location: [facility];  Service: Orthopedics;  Laterality:  Right;  . TONSILLECTOMY  AGE 42    There were no vitals filed for this visit.  Subjective Assessment - 02/04/20 0851    Subjective  "Good" Reports that's PA sated that it was ok to work over head with more than 2 pounds    Currently in Pain?  No/denies                       [facility] Adult PT Treatment/Exercise - 02/04/20 0001      Shoulder Exercises: Standing   Flexion  Right;Weights;Strengthening;20 reps   pillow case on wall, face to overhead   Shoulder Flexion Weight (lbs)  3    Other Standing Exercises  2lb 2 to 3rd level cabinet reaches flex, no weight abd  2x10     Other Standing Exercises  5lb cable rear flys 2x10, Drop and catch green ball flex 2x10       Shoulder Exercises: ROM/Strengthening   UBE (Upper Arm Bike)  constant work 25 watts 3 min each     Other ROM/Strengthening Exercises  Rows 55lb 2x15, Lats 55 2x10     Other ROM/Strengthening Exercises  chest press 25lb 2x15      Shoulder Exercises: Stretch   Other Shoulder Stretches  free throw simulation motion green ball 2x5      Manual Therapy   Manual Therapy  Passive ROM    Manual therapy  comments  PROM taken to end range and held               PT Short Term Goals - 07/03/19 1024      PT SHORT TERM GOAL #1   Title  independent with initial HEP    Status  Achieved        PT Long Term Goals - 02/01/20 0926      PT LONG TERM GOAL #1   Title  decrease pain 50%    Status  Achieved      PT LONG TERM GOAL #2   Title  AROM of the right shoulder to WFL's    Status  Partially Met      PT LONG TERM GOAL #3   Title  report no difficulty with dressing and doing hair    Status  Achieved      PT LONG TERM GOAL #4   Title  sleep without pain down the arm    Status  Achieved      PT LONG TERM GOAL #5   Title  lift 5# overhead with right arm    Status  On-going            Plan - 02/04/20 0924    Clinical Impression Statement  R shoulder weakness remains when arm is above 90  with flexion and abduction. She did have some crepitus with overhead activities. No issues with 3lb flexion from face to overhead pillow case on wall. Good strength with rows and lats.    Rehab Potential  Good    PT Frequency  2x / week    PT Duration  8 weeks    PT Treatment/Interventions  ADLs/Self Care Home Management;Cryotherapy;Electrical Stimulation;Therapeutic activities;Therapeutic exercise;Patient/family education;Manual techniques;Vasopneumatic Device    PT Next Visit Plan  overhead strengthening       Patient will benefit from skilled therapeutic intervention in order to improve the following deficits and impairments:  Pain, Postural dysfunction, Increased muscle spasms, Decreased scar mobility, Decreased activity tolerance, Decreased range of motion, Decreased strength, Impaired UE functional use, Impaired flexibility, Increased edema  Visit Diagnosis: Localized edema  Stiffness of right shoulder, not elsewhere classified  Acute pain of right shoulder     Problem List Patient Active Problem List   Diagnosis Date Noted  . S/P rotator cuff repair 03/18/2014    Scot Jun, PTA 02/04/2020, 9:27 AM  [address] [place], [place], [address] Phone: [phone]   Fax:  [phone]  Name: Stacey Kelly MRN: [id number] Date of Birth: 16-Jul-1956

## 2020-02-05 DIAGNOSIS — Z23 Encounter for immunization: Secondary | ICD-10-CM | POA: Diagnosis not present

## 2020-02-05 DIAGNOSIS — T65811D Toxic effect of latex, accidental (unintentional), subsequent encounter: Secondary | ICD-10-CM | POA: Diagnosis not present

## 2020-02-05 DIAGNOSIS — E663 Overweight: Secondary | ICD-10-CM | POA: Diagnosis not present

## 2020-02-05 DIAGNOSIS — Z0000 Encounter for general adult medical examination without abnormal findings: Secondary | ICD-10-CM | POA: Diagnosis not present

## 2020-02-05 DIAGNOSIS — Z6827 Body mass index (BMI) 27.0-27.9, adult: Secondary | ICD-10-CM | POA: Diagnosis not present

## 2020-02-05 DIAGNOSIS — M859 Disorder of bone density and structure, unspecified: Secondary | ICD-10-CM | POA: Diagnosis not present

## 2020-02-05 DIAGNOSIS — Z131 Encounter for screening for diabetes mellitus: Secondary | ICD-10-CM | POA: Diagnosis not present

## 2020-02-05 DIAGNOSIS — Z13220 Encounter for screening for lipoid disorders: Secondary | ICD-10-CM | POA: Diagnosis not present

## 2020-02-05 DIAGNOSIS — J301 Allergic rhinitis due to pollen: Secondary | ICD-10-CM | POA: Diagnosis not present

## 2020-02-05 DIAGNOSIS — K5900 Constipation, unspecified: Secondary | ICD-10-CM | POA: Diagnosis not present

## 2020-02-09 ENCOUNTER — Ambulatory Visit: Payer: PRIVATE HEALTH INSURANCE

## 2020-02-09 ENCOUNTER — Other Ambulatory Visit: Payer: Self-pay

## 2020-02-09 DIAGNOSIS — M25511 Pain in right shoulder: Secondary | ICD-10-CM

## 2020-02-09 DIAGNOSIS — R600 Localized edema: Secondary | ICD-10-CM | POA: Diagnosis not present

## 2020-02-09 DIAGNOSIS — M25611 Stiffness of right shoulder, not elsewhere classified: Secondary | ICD-10-CM

## 2020-02-09 NOTE — Therapy (Signed)
[place], [place], [address] Phone: [phone]   Fax:  [phone]  Physical Therapy Treatment  Patient Details  Name: Stacey Kelly MRN: [id number] Date of Birth: [birth year] Referring Provider (PT): RA Theda Sers   Encounter Date: 02/09/2020  PT End of Session - 02/09/20 1624    Visit Number  60    Authorization Type  W/C    PT Start Time  1536    PT Stop Time  1618    PT Time Calculation (min)  42 min    Activity Tolerance  Patient tolerated treatment well    Behavior During Therapy  [facility] for tasks assessed/performed       Past Medical History:  Diagnosis Date  . Depression   . Heart murmur    ASYMPTOMATIC--  ECHO  1995  MILD REGURG  . Migraine   . OA (osteoarthritis)    shoulders  . PONV (postoperative nausea and vomiting)   . Rotator cuff tear, right   . Vitamin D deficiency   . Wears glasses     Past Surgical History:  Procedure Laterality Date  . BUNIONECTOMY Right 2004   BOTH SIDES OF FOOT  . CRYOTHERAPY    . HARDWARE REMOVAL Right 04/15/2014   Procedure: EXCISION OF RIGHT FOOT DORSAL HARDWARE;  Surgeon: Wylene Simmer, MD;  Location: [place];  Service: Orthopedics;  Laterality: Right;  . RIGHT SHOULDER SURGERY  X3  LAST ONE 2010   INCLUDING ROTATOR CUFF REPAIR /  RECONSTRUCTION  . SHOULDER ARTHROSCOPY WITH ROTATOR CUFF REPAIR Left 03/18/2014   Procedure: LEFT SHOULDER ARTHROSCOPY WITH EVALUATION UNDER ANESTHESIA DEBRIDEMENT SUBACROMIAL DECOMPRESSION DISTAL CLAVICAL RESECTION ROTATOR CUFF REPAIR BICEPS  TENDINOSIS;  Surgeon: Sydnee Cabal, MD;  Location: [place];  Service: Orthopedics;  Laterality: Left;  . SHOULDER ARTHROSCOPY WITH ROTATOR CUFF REPAIR Right 06/23/2019   Procedure: SHOULDER ARTHROSCOPY WITH ROTATOR CUFF REPAIR , biceps tenotomy;  Surgeon: Sydnee Cabal, MD;  Location: [facility];  Service: Orthopedics;   Laterality: Right;  . TONSILLECTOMY  AGE 64    There were no vitals filed for this visit.  Subjective Assessment - 02/09/20 1539    Subjective  Pt reports her shoulder is feeling good today. She was able to mow the yard today.    Currently in Pain?  No/denies                       [facility] Adult PT Treatment/Exercise - 02/09/20 0001      Shoulder Exercises: Standing   External Rotation  AROM;Strengthening;Both;20 reps;Theraband    Theraband Level (Shoulder External Rotation)  Level 2 (Red)    Diagonals  AROM;Strengthening;Right;12 reps    Theraband Level (Shoulder Diagonals)  Level 2 (Red)    Diagonals Weight (lbs)  D2 Flexion with scapular approximation into upward/downward rotation    Other Standing Exercises  scapular clocks on wall with RTB x 8 (1, 3, 5 o'clock)      Shoulder Exercises: ROM/Strengthening   Wall Pushups  10 reps    Wall Pushups Limitations  serratus    Plank  5 reps    Plank Limitations  on table, adding serratus push-ups w/significant difficulty     Other ROM/Strengthening Exercises  LAT PD 55# 1 x 10, Wide LAT PD 45# 1 x 10    Other ROM/Strengthening Exercises  Standing LAT PD with rope 1 x 10 35#  cue to lift chest up after pulling down increased retraction     Manual Therapy   Manual Therapy  Joint mobilization;Passive ROM    Joint Mobilization  Posterior, inferior GHJ glides grade III    Passive ROM  Shoulder Flexion             PT Education - 02/09/20 1624    Education Details  Adding banded ER at 0 ABD prior to exercises, as well as at work; standing posture    Person(s) Educated  Patient    Methods  Explanation;Demonstration;Tactile cues;Verbal cues    Comprehension  Verbalized understanding;Returned demonstration;Tactile cues required;Verbal cues required       PT Short Term Goals - 07/03/19 1024      PT SHORT TERM GOAL #1   Title  independent with initial HEP    Status  Achieved        PT Long Term Goals -  02/01/20 0926      PT LONG TERM GOAL #1   Title  decrease pain 50%    Status  Achieved      PT LONG TERM GOAL #2   Title  AROM of the right shoulder to WFL's    Status  Partially Met      PT LONG TERM GOAL #3   Title  report no difficulty with dressing and doing hair    Status  Achieved      PT LONG TERM GOAL #4   Title  sleep without pain down the arm    Status  Achieved      PT LONG TERM GOAL #5   Title  lift 5# overhead with right arm    Status  On-going            Plan - 02/09/20 1625    Clinical Impression Statement  Pt compensates into shoulder flexion with inability to dissociate GHJ from scapulothoracic, increasing lower thoracic pain by end of session due to compensatory mechanism. She lacks motor control of R scapula and responded well to tactile cues to R shoulder, manual approximation of scapula with D2 Flexion, and with AAROM R shoulder flexion with back to wall at end of session with reciprocal inhibition during eccentric to activate posterior cuff. Pt will benefit from continued strengthening of periscapular and rotator cuff musculature, as well as scapular mechanics and control for overhead movement and strength.    Rehab Potential  Good    PT Frequency  2x / week    PT Duration  8 weeks    PT Treatment/Interventions  ADLs/Self Care Home Management;Cryotherapy;Electrical Stimulation;Therapeutic activities;Therapeutic exercise;Patient/family education;Manual techniques;Vasopneumatic Device    PT Next Visit Plan  continue to progress scapulohumeral rhythm and strength, easing into OH strengthening    Consulted and Agree with Plan of Care  Patient       Patient will benefit from skilled therapeutic intervention in order to improve the following deficits and impairments:  Pain, Postural dysfunction, Increased muscle spasms, Decreased scar mobility, Decreased activity tolerance, Decreased range of motion, Decreased strength, Impaired UE functional use, Impaired  flexibility, Increased edema  Visit Diagnosis: Localized edema  Stiffness of right shoulder, not elsewhere classified  Acute pain of right shoulder     Problem List Patient Active Problem List   Diagnosis Date Noted  . S/P rotator cuff repair 03/18/2014    Izell Fishers, PT, DPT 02/09/2020, 4:36 PM  [place], [place], [address]  Phone: [phone]   Fax:  [phone]  Name: Stacey Kelly MRN: [id number] Date of Birth: 1956/09/26

## 2020-02-11 ENCOUNTER — Ambulatory Visit: Payer: PRIVATE HEALTH INSURANCE | Admitting: Physical Therapy

## 2020-02-12 ENCOUNTER — Other Ambulatory Visit: Payer: Self-pay

## 2020-02-12 ENCOUNTER — Ambulatory Visit: Payer: PRIVATE HEALTH INSURANCE

## 2020-02-12 DIAGNOSIS — M25511 Pain in right shoulder: Secondary | ICD-10-CM

## 2020-02-12 DIAGNOSIS — R600 Localized edema: Secondary | ICD-10-CM | POA: Diagnosis not present

## 2020-02-12 DIAGNOSIS — M25611 Stiffness of right shoulder, not elsewhere classified: Secondary | ICD-10-CM

## 2020-02-12 NOTE — Therapy (Signed)
[place], [place], [address] Phone: [phone]   Fax:  [phone]  Physical Therapy Treatment  Patient Details  Name: Stacey Kelly MRN: [id number] Date of Birth: [birth year] Referring Provider (PT): RA Theda Sers   Encounter Date: 02/12/2020  PT End of Session - 02/12/20 1029    Visit Number  45    Authorization Type  W/C    PT Start Time  0933    PT Stop Time  1020    PT Time Calculation (min)  47 min    Activity Tolerance  Patient tolerated treatment well    Behavior During Therapy  [facility] for tasks assessed/performed       Past Medical History:  Diagnosis Date  . Depression   . Heart murmur    ASYMPTOMATIC--  ECHO  1995  MILD REGURG  . Migraine   . OA (osteoarthritis)    shoulders  . PONV (postoperative nausea and vomiting)   . Rotator cuff tear, right   . Vitamin D deficiency   . Wears glasses     Past Surgical History:  Procedure Laterality Date  . BUNIONECTOMY Right 2004   BOTH SIDES OF FOOT  . CRYOTHERAPY    . HARDWARE REMOVAL Right 04/15/2014   Procedure: EXCISION OF RIGHT FOOT DORSAL HARDWARE;  Surgeon: Wylene Simmer, MD;  Location: [place];  Service: Orthopedics;  Laterality: Right;  . RIGHT SHOULDER SURGERY  X3  LAST ONE 2010   INCLUDING ROTATOR CUFF REPAIR /  RECONSTRUCTION  . SHOULDER ARTHROSCOPY WITH ROTATOR CUFF REPAIR Left 03/18/2014   Procedure: LEFT SHOULDER ARTHROSCOPY WITH EVALUATION UNDER ANESTHESIA DEBRIDEMENT SUBACROMIAL DECOMPRESSION DISTAL CLAVICAL RESECTION ROTATOR CUFF REPAIR BICEPS  TENDINOSIS;  Surgeon: Sydnee Cabal, MD;  Location: [place];  Service: Orthopedics;  Laterality: Left;  . SHOULDER ARTHROSCOPY WITH ROTATOR CUFF REPAIR Right 06/23/2019   Procedure: SHOULDER ARTHROSCOPY WITH ROTATOR CUFF REPAIR , biceps tenotomy;  Surgeon: Sydnee Cabal, MD;  [facility];  Service: Orthopedics;   Laterality: Right;  . TONSILLECTOMY  AGE 64    There were no vitals filed for this visit.  Subjective Assessment - 02/12/20 0938    Subjective  Pt reports feeling very sore, like she worked her back and shoulder a lot in a good way.    Patient Stated Goals  get back to work    Currently in Pain?  No/denies                       [facility] Adult PT Treatment/Exercise - 02/12/20 0001      Exercises   Exercises  Shoulder      Shoulder Exercises: Sidelying   Other Sidelying Exercises  LAT release on FR x 2 minutes with transverse rotation to release TrP      Shoulder Exercises: Standing   External Rotation  AROM;Strengthening;Both;20 reps;Theraband    Theraband Level (Shoulder External Rotation)  Level 2 (Red)    Diagonals  AROM;Strengthening;Right;12 reps;Theraband    Theraband Level (Shoulder Diagonals)  Level 1 (Yellow)    Diagonals Weight (lbs)  D2 Flexion with scapular approximation into upward/downward rotation    Other Standing Exercises  scapular clocks on wall with YTB around wrists 1 x 10 (1, 3, and 5 o'clock) with focus on elbow staying into side to prevent scapular winging      Shoulder Exercises: ROM/Strengthening   UBE (Upper Arm Bike)  Resistance level  3 fwd 2.5 min, bkwd 2 min     Wall Pushups  10 reps    Wall Pushups Limitations  serratus    Other ROM/Strengthening Exercises  LAT PD wide 35# 1 x 10, normal 45# 1 x 12    Other ROM/Strengthening Exercises  Standing LAT PD with rope 1 x 10 35#    cue to lift chest up after pulling down increased retraction     Shoulder Exercises: Stretch   Other Shoulder Stretches  Standing extension at doorway 2 x 10"    Other Shoulder Stretches  Standing Adduction hands clasped behind back 2 x 20"      Manual Therapy   Manual Therapy  Joint mobilization;Passive ROM    Joint Mobilization  Inferior scapular glides grade III-IV in prone with R HBH to mobilize into IR, supine posterior GHJ grade III-IV with distraction of  humerus and IR    Passive ROM  IR/ER/Flexion             PT Education - 02/12/20 1028    Education Details  Stretching in shoulder extension at doorway and hands clasped behind back to help improve reaching hand behind back IR    Person(s) Educated  Patient    Methods  Explanation;Demonstration;Tactile cues;Verbal cues    Comprehension  Verbalized understanding;Returned demonstration       PT Short Term Goals - 07/03/19 1024      PT SHORT TERM GOAL #1   Title  independent with initial HEP    Status  Achieved        PT Long Term Goals - 02/01/20 0926      PT LONG TERM GOAL #1   Title  decrease pain 50%    Status  Achieved      PT LONG TERM GOAL #2   Title  AROM of the right shoulder to WFL's    Status  Partially Met      PT LONG TERM GOAL #3   Title  report no difficulty with dressing and doing hair    Status  Achieved      PT LONG TERM GOAL #4   Title  sleep without pain down the arm    Status  Achieved      PT LONG TERM GOAL #5   Title  lift 5# overhead with right arm    Status  On-going            Plan - 02/12/20 1029    Clinical Impression Statement  Pt presents with appropriate muscle soreness in serratus anterior and LATS following session on Tuesday. Pt continues to demonstrates scuaplar winging and poor scapulohumeral rhythm, but did demonstrate carryover of cueing for improved shoulder depression and retraction during exercises. Initiated more IR mobilization and stretching at end of session, as well as LAT release on FR with education to continue with that at home on FR. Pt will continue to benefit from improving scapulohumeral rhythm and strengthening posterior cuff and periscapular musculature for improved overhead reaching.    Rehab Potential  Good    PT Frequency  2x / week    PT Duration  8 weeks    PT Treatment/Interventions  ADLs/Self Care Home Management;Cryotherapy;Electrical Stimulation;Therapeutic activities;Therapeutic  exercise;Patient/family education;Manual techniques;Vasopneumatic Device    PT Next Visit Plan  continue to progress scapulohumeral rhythm and strength, easing into OH strengthening    Consulted and Agree with Plan of Care  Patient       Patient will benefit from skilled  therapeutic intervention in order to improve the following deficits and impairments:  Pain, Postural dysfunction, Increased muscle spasms, Decreased scar mobility, Decreased activity tolerance, Decreased range of motion, Decreased strength, Impaired UE functional use, Impaired flexibility, Increased edema  Visit Diagnosis: Stiffness of right shoulder, not elsewhere classified  Acute pain of right shoulder  Localized edema     Problem List Patient Active Problem List   Diagnosis Date Noted  . S/P rotator cuff repair 03/18/2014    Izell Madrone, PT, DPT 02/12/2020, 10:32 AM  [place] [address] [place], [place], [address] Phone: [phone]   Fax:  [phone]  Name: Stacey Kelly MRN: [id number] Date of Birth: [birth year]

## 2020-02-15 ENCOUNTER — Other Ambulatory Visit: Payer: Self-pay

## 2020-02-15 ENCOUNTER — Encounter: Payer: Self-pay | Admitting: Physical Therapy

## 2020-02-15 ENCOUNTER — Ambulatory Visit: Payer: PRIVATE HEALTH INSURANCE | Admitting: Physical Therapy

## 2020-02-15 DIAGNOSIS — M25511 Pain in right shoulder: Secondary | ICD-10-CM

## 2020-02-15 DIAGNOSIS — R600 Localized edema: Secondary | ICD-10-CM | POA: Diagnosis not present

## 2020-02-15 DIAGNOSIS — M25611 Stiffness of right shoulder, not elsewhere classified: Secondary | ICD-10-CM

## 2020-02-15 NOTE — Therapy (Signed)
[place], [place], [address] Phone: [phone]   Fax:  [phone]  Physical Therapy Treatment  Patient Details  Name: Stacey Kelly MRN: [id number] Date of Birth: 1955/12/16 Referring Provider (PT): RA Theda Sers   Encounter Date: 02/15/2020  PT End of Session - 02/15/20 1148    Visit Number  70    Authorization Type  W/C    PT Start Time  2585    PT Stop Time  1145    PT Time Calculation (min)  40 min    Activity Tolerance  Patient tolerated treatment well    Behavior During Therapy  [facility] for tasks assessed/performed       Past Medical History:  Diagnosis Date  . Depression   . Heart murmur    ASYMPTOMATIC--  ECHO  1995  MILD REGURG  . Migraine   . OA (osteoarthritis)    shoulders  . PONV (postoperative nausea and vomiting)   . Rotator cuff tear, right   . Vitamin D deficiency   . Wears glasses     Past Surgical History:  Procedure Laterality Date  . BUNIONECTOMY Right 2004   BOTH SIDES OF FOOT  . CRYOTHERAPY    . HARDWARE REMOVAL Right 04/15/2014   Procedure: EXCISION OF RIGHT FOOT DORSAL HARDWARE;  Surgeon: Wylene Simmer, MD;  Location: [place];  Service: Orthopedics;  Laterality: Right;  . RIGHT SHOULDER SURGERY  X3  LAST ONE 2010   INCLUDING ROTATOR CUFF REPAIR /  RECONSTRUCTION  . SHOULDER ARTHROSCOPY WITH ROTATOR CUFF REPAIR Left 03/18/2014   Procedure: LEFT SHOULDER ARTHROSCOPY WITH EVALUATION UNDER ANESTHESIA DEBRIDEMENT SUBACROMIAL DECOMPRESSION DISTAL CLAVICAL RESECTION ROTATOR CUFF REPAIR BICEPS  TENDINOSIS;  Surgeon: Sydnee Cabal, MD;  Location: [place];  Service: Orthopedics;  Laterality: Left;  . SHOULDER ARTHROSCOPY WITH ROTATOR CUFF REPAIR Right 06/23/2019   Procedure: SHOULDER ARTHROSCOPY WITH ROTATOR CUFF REPAIR , biceps tenotomy;  Surgeon: Sydnee Cabal, MD;  Location: [facility];  Service: Orthopedics;   Laterality: Right;  . TONSILLECTOMY  AGE 24    There were no vitals filed for this visit.  Subjective Assessment - 02/15/20 1106    Subjective  "just fatigue and weak above my shoulder, no pain"    Currently in Pain?  No/denies                       [facility] Adult PT Treatment/Exercise - 02/15/20 0001      Exercises   Exercises  Shoulder      Shoulder Exercises: Supine   External Rotation  Right;20 reps;Weights    External Rotation Weight (lbs)  3    Internal Rotation  Right;AROM;20 reps;Weights    Internal Rotation Weight (lbs)  3      Shoulder Exercises: Standing   Extension  Weights;Both;20 reps    Extension Weight (lbs)  20    Other Standing Exercises  RUE from counter top to 3 cabinet level holding green ball x 10 flex and abd each     Other Standing Exercises  5lb cable rear flys 2x10, Drop and catch green ball flex & abd 2x10, Triceps ext 45lb 210, Biceps curls 20lb 2x10       Shoulder Exercises: ROM/Strengthening   UBE (Upper Arm Bike)  constant work 25 watts 3 min each     Other ROM/Strengthening Exercises  Rows 65lb 2x10, Lats 55 2x10 narrow, wide 25lb x20  Manual Therapy   Manual therapy comments  PROM taken to end range and held    Passive ROM  IR/ER/Flexion               PT Short Term Goals - 07/03/19 1024      PT SHORT TERM GOAL #1   Title  independent with initial HEP    Status  Achieved        PT Long Term Goals - 02/01/20 0926      PT LONG TERM GOAL #1   Title  decrease pain 50%    Status  Achieved      PT LONG TERM GOAL #2   Title  AROM of the right shoulder to WFL's    Status  Partially Met      PT LONG TERM GOAL #3   Title  report no difficulty with dressing and doing hair    Status  Achieved      PT LONG TERM GOAL #4   Title  sleep without pain down the arm    Status  Achieved      PT LONG TERM GOAL #5   Title  lift 5# overhead with right arm    Status  On-going            Plan - 02/15/20 1149     Clinical Impression Statement  RUE weakness and decrease muscular endurance remain with actions above 90 degrees. Visual weakness present after only a few reps of any flexion and abduction interventions. Some R shoulder protrusion noted with supine internal rotation. increase resistance tolerated with seated rows and triceps ext.    Stability/Clinical Decision Making  Evolving/Moderate complexity    Rehab Potential  Good    PT Frequency  2x / week    PT Duration  8 weeks    PT Treatment/Interventions  ADLs/Self Care Home Management;Cryotherapy;Electrical Stimulation;Therapeutic activities;Therapeutic exercise;Patient/family education;Manual techniques;Vasopneumatic Device    PT Next Visit Plan  continue to progress scapulohumeral rhythm and strength, easing into OH strengthening       Patient will benefit from skilled therapeutic intervention in order to improve the following deficits and impairments:  Pain, Postural dysfunction, Increased muscle spasms, Decreased scar mobility, Decreased activity tolerance, Decreased range of motion, Decreased strength, Impaired UE functional use, Impaired flexibility, Increased edema  Visit Diagnosis: Stiffness of right shoulder, not elsewhere classified  Acute pain of right shoulder  Localized edema     Problem List Patient Active Problem List   Diagnosis Date Noted  . S/P rotator cuff repair 03/18/2014    Scot Jun, PTA 02/15/2020, 11:58 AM  [place] [address] [place], [place], [address] Phone: [phone]   Fax:  [phone]  Name: Stacey Kelly MRN: [id number] Date of Birth: 03-09-1956

## 2020-02-18 ENCOUNTER — Ambulatory Visit: Payer: PRIVATE HEALTH INSURANCE | Admitting: Physical Therapy

## 2020-02-18 ENCOUNTER — Other Ambulatory Visit: Payer: Self-pay

## 2020-02-18 ENCOUNTER — Encounter: Payer: Self-pay | Admitting: Physical Therapy

## 2020-02-18 DIAGNOSIS — M25611 Stiffness of right shoulder, not elsewhere classified: Secondary | ICD-10-CM

## 2020-02-18 DIAGNOSIS — R600 Localized edema: Secondary | ICD-10-CM

## 2020-02-18 DIAGNOSIS — M25511 Pain in right shoulder: Secondary | ICD-10-CM

## 2020-02-18 NOTE — Therapy (Signed)
[place], [place], [address] Phone: [phone]   Fax:  [phone]  Physical Therapy Treatment  Patient Details  Name: Stacey Kelly MRN: [id number] Date of Birth: [birth year] Referring Provider (PT): RA Theda Sers   Encounter Date: 02/18/2020  PT End of Session - 02/18/20 0841    Visit Number  24    Authorization Type  W/C    PT Start Time  0804    PT Stop Time  0845    PT Time Calculation (min)  41 min    Activity Tolerance  Patient tolerated treatment well    Behavior During Therapy  [facility] for tasks assessed/performed       Past Medical History:  Diagnosis Date  . Depression   . Heart murmur    ASYMPTOMATIC--  ECHO  1995  MILD REGURG  . Migraine   . OA (osteoarthritis)    shoulders  . PONV (postoperative nausea and vomiting)   . Rotator cuff tear, right   . Vitamin D deficiency   . Wears glasses     Past Surgical History:  Procedure Laterality Date  . BUNIONECTOMY Right 2004   BOTH SIDES OF FOOT  . CRYOTHERAPY    . HARDWARE REMOVAL Right 04/15/2014   Procedure: EXCISION OF RIGHT FOOT DORSAL HARDWARE;  Surgeon: Wylene Simmer, MD;  Location: [place];  Service: Orthopedics;  Laterality: Right;  . RIGHT SHOULDER SURGERY  X3  LAST ONE 2010   INCLUDING ROTATOR CUFF REPAIR /  RECONSTRUCTION  . SHOULDER ARTHROSCOPY WITH ROTATOR CUFF REPAIR Left 03/18/2014   Procedure: LEFT SHOULDER ARTHROSCOPY WITH EVALUATION UNDER ANESTHESIA DEBRIDEMENT SUBACROMIAL DECOMPRESSION DISTAL CLAVICAL RESECTION ROTATOR CUFF REPAIR BICEPS  TENDINOSIS;  Surgeon: Sydnee Cabal, MD;  Location: [place];  Service: Orthopedics;  Laterality: Left;  . SHOULDER ARTHROSCOPY WITH ROTATOR CUFF REPAIR Right 06/23/2019   Procedure: SHOULDER ARTHROSCOPY WITH ROTATOR CUFF REPAIR , biceps tenotomy;  Surgeon: Sydnee Cabal, MD;  [facility];  Service: Orthopedics;   Laterality: Right;  . TONSILLECTOMY  AGE 37    There were no vitals filed for this visit.  Subjective Assessment - 02/18/20 0806    Subjective  Does well with activities below her shoulder. Difficulty handing IV bags at work. No pain only stiffness    Limitations  Lifting;House hold activities    Currently in Pain?  No/denies                       [facility] Adult PT Treatment/Exercise - 02/18/20 0001      Shoulder Exercises: Standing   External Rotation  AROM;Strengthening;Both;20 reps;Weights   min assist    External Rotation Weight (lbs)  5    Internal Rotation  Weights;Strengthening;Right;15 reps   x2   Flexion  Right;Weights;Strengthening;20 reps    Shoulder Flexion Weight (lbs)  1    Extension  Weights;Both;20 reps    Extension Weight (lbs)  20    Other Standing Exercises  RUE from counter top to 3 cabinet level holding green ball x 10 flex and abd each,  RUE abd 90 ER ball toss green ball 2x5    Other Standing Exercises  5lb cable rear flys 2x10, # way on wall red tband 2x5 RUE, Triceps ext 45lb 210, Biceps curls 20lb 2x10       Shoulder Exercises: ROM/Strengthening   UBE (Upper Arm Bike)  constant work 25 watts 3 min  each     Other ROM/Strengthening Exercises  Rows 65lb 2x10, Lats 55 2x10 narrow, wide 25lb x20      Other ROM/Strengthening Exercises  chest press 25lb 2x15               PT Short Term Goals - 07/03/19 1024      PT SHORT TERM GOAL #1   Title  independent with initial HEP    Status  Achieved        PT Long Term Goals - 02/18/20 7654      PT LONG TERM GOAL #1   Title  decrease pain 50%    Status  Achieved      PT LONG TERM GOAL #2   Title  AROM of the right shoulder to WFL's    Status  Partially Met      PT LONG TERM GOAL #3   Title  report no difficulty with dressing and doing hair    Status  Achieved      PT LONG TERM GOAL #4   Title  sleep without pain down the arm    Status  Achieved      PT LONG TERM GOAL #5    Title  lift 5# overhead with right arm    Status  Partially Met            Plan - 02/18/20 0842    Clinical Impression Statement  Pt has progressed increasing her ability to reach over head with light resistance. She is not able to complete 3 reps before showing visible signs of fatigue. She did well with external rotation ball toss. Some shoulder elevation present towards end of session with 1lb weight.    Stability/Clinical Decision Making  Evolving/Moderate complexity    Rehab Potential  Good    PT Frequency  2x / week    PT Duration  8 weeks    PT Treatment/Interventions  ADLs/Self Care Home Management;Cryotherapy;Electrical Stimulation;Therapeutic activities;Therapeutic exercise;Patient/family education;Manual techniques;Vasopneumatic Device    PT Next Visit Plan  continue to progress scapulohumeral rhythm and strength, easing into OH strengthening       Patient will benefit from skilled therapeutic intervention in order to improve the following deficits and impairments:  Pain, Postural dysfunction, Increased muscle spasms, Decreased scar mobility, Decreased activity tolerance, Decreased range of motion, Decreased strength, Impaired UE functional use, Impaired flexibility, Increased edema  Visit Diagnosis: Stiffness of right shoulder, not elsewhere classified  Localized edema  Acute pain of right shoulder     Problem List Patient Active Problem List   Diagnosis Date Noted  . S/P rotator cuff repair 03/18/2014    Scot Jun, PTA 02/18/2020, 8:46 AM  [place] [place] [address] [place], [place], [address] Phone: [phone]   Fax:  [phone]  Name: Stacey Kelly MRN: [id number] Date of Birth: 1956-03-21

## 2020-02-22 ENCOUNTER — Other Ambulatory Visit: Payer: Self-pay

## 2020-02-22 ENCOUNTER — Ambulatory Visit: Payer: PRIVATE HEALTH INSURANCE | Admitting: Physical Therapy

## 2020-02-22 ENCOUNTER — Encounter: Payer: Self-pay | Admitting: Physical Therapy

## 2020-02-22 DIAGNOSIS — R600 Localized edema: Secondary | ICD-10-CM

## 2020-02-22 DIAGNOSIS — M25511 Pain in right shoulder: Secondary | ICD-10-CM

## 2020-02-22 DIAGNOSIS — M25611 Stiffness of right shoulder, not elsewhere classified: Secondary | ICD-10-CM

## 2020-02-22 NOTE — Therapy (Signed)
Stacey Kelly [place], [place], [address] Phone: [phone]   Fax:  [phone]  Physical Therapy Treatment  Patient Details  Name: Stacey Kelly MRN: [id number] Date of Birth: [birth year] Referring Provider (PT): RA Theda Sers   Encounter Date: 02/22/2020  PT End of Session - 02/22/20 0844    Visit Number  3    PT Start Time  0804    PT Stop Time  0845    PT Time Calculation (min)  41 min    Activity Tolerance  Patient tolerated treatment well    Behavior During Therapy  [facility] for tasks assessed/performed       Past Medical History:  Diagnosis Date  . Depression   . Heart murmur    ASYMPTOMATIC--  ECHO  1995  MILD REGURG  . Migraine   . OA (osteoarthritis)    shoulders  . PONV (postoperative nausea and vomiting)   . Rotator cuff tear, right   . Vitamin D deficiency   . Wears glasses     Past Surgical History:  Procedure Laterality Date  . BUNIONECTOMY Right 2004   BOTH SIDES OF FOOT  . CRYOTHERAPY    . HARDWARE REMOVAL Right 04/15/2014   Procedure: EXCISION OF RIGHT FOOT DORSAL HARDWARE;  Surgeon: Wylene Simmer, MD;  Location: [place];  Service: Orthopedics;  Laterality: Right;  . RIGHT SHOULDER SURGERY  X3  LAST ONE 2010   INCLUDING ROTATOR CUFF REPAIR /  RECONSTRUCTION  . SHOULDER ARTHROSCOPY WITH ROTATOR CUFF REPAIR Left 03/18/2014   Procedure: LEFT SHOULDER ARTHROSCOPY WITH EVALUATION UNDER ANESTHESIA DEBRIDEMENT SUBACROMIAL DECOMPRESSION DISTAL CLAVICAL RESECTION ROTATOR CUFF REPAIR BICEPS  TENDINOSIS;  Surgeon: Sydnee Cabal, MD;  Location: [place];  Service: Orthopedics;  Laterality: Left;  . SHOULDER ARTHROSCOPY WITH ROTATOR CUFF REPAIR Right 06/23/2019   Procedure: SHOULDER ARTHROSCOPY WITH ROTATOR CUFF REPAIR , biceps tenotomy;  Surgeon: Sydnee Cabal, MD;  Location: [facility];  Service: Orthopedics;  Laterality: Right;  . TONSILLECTOMY   AGE 64    There were no vitals filed for this visit.  Subjective Assessment - 02/22/20 0805    Subjective  "Feels good, just a lot of clicking and popping"    Currently in Pain?  No/denies                       [facility] Adult PT Treatment/Exercise - 02/22/20 0001      Shoulder Exercises: Standing   External Rotation  AROM;Strengthening;Both;20 reps;Weights    External Rotation Weight (lbs)  5    Flexion  Right;Weights;Strengthening;20 reps    Shoulder Flexion Weight (lbs)  2    Extension  Weights;Both;20 reps    Extension Weight (lbs)  20    Other Standing Exercises  RUE from counter top to 3 cabinet level holding green ball x 10 flex and abd each,  RUE abd 90 ER ball toss green ball 2x5      Shoulder Exercises: ROM/Strengthening   UBE (Upper Arm Bike)  constant work 25 watts 3 min each     Other ROM/Strengthening Exercises  Rows 65lb 2x10, Lats 55 2x10 narrow, wide 25lb x20, D2 Flex 1lb 2x10    Other ROM/Strengthening Exercises  chest press 25lb 2x15      Manual Therapy   Manual therapy comments  PROM taken to end range and held, Tight with internal rotation    Passive ROM  IR/ER/Flexion  PT Short Term Goals - 07/03/19 1024      PT SHORT TERM GOAL #1   Title  independent with initial HEP    Status  Achieved        PT Long Term Goals - 02/18/20 3094      PT LONG TERM GOAL #1   Title  decrease pain 50%    Status  Achieved      PT LONG TERM GOAL #2   Title  AROM of the right shoulder to WFL's    Status  Partially Met      PT LONG TERM GOAL #3   Title  report no difficulty with dressing and doing hair    Status  Achieved      PT LONG TERM GOAL #4   Title  sleep without pain down the arm    Status  Achieved      PT LONG TERM GOAL #5   Title  lift 5# overhead with right arm    Status  Partially Met            Plan - 02/22/20 0850    Clinical Impression Statement  Pt continues to show increase muscular endurance with  overhead activities with little to no resistance. She remains strond with all pulling motions. Tactile cues needed for posture with external rotation. Shoulder elevation present with flexion today's more so than last treatment.    Stability/Clinical Decision Making  Evolving/Moderate complexity    Rehab Potential  Good    PT Frequency  2x / week    PT Duration  8 weeks    PT Treatment/Interventions  ADLs/Self Care Home Management;Cryotherapy;Electrical Stimulation;Therapeutic activities;Therapeutic exercise;Patient/family education;Manual techniques;Vasopneumatic Device    PT Next Visit Plan  continue to progress scapulohumeral rhythm and strength, easing into OH strengthening       Patient will benefit from skilled therapeutic intervention in order to improve the following deficits and impairments:  Pain, Postural dysfunction, Increased muscle spasms, Decreased scar mobility, Decreased activity tolerance, Decreased range of motion, Decreased strength, Impaired UE functional use, Impaired flexibility, Increased edema  Visit Diagnosis: Acute pain of right shoulder  Localized edema  Stiffness of right shoulder, not elsewhere classified     Problem List Patient Active Problem List   Diagnosis Date Noted  . S/P rotator cuff repair 03/18/2014    Scot Jun, PTA 02/22/2020, 8:55 AM  [address] [place], [place], [address] Phone: [phone]   Fax:  [phone]  Name: Stacey Kelly MRN: [id number] Date of Birth: 06-Dec-1955

## 2020-02-25 ENCOUNTER — Encounter: Payer: Self-pay | Admitting: Physical Therapy

## 2020-02-25 ENCOUNTER — Other Ambulatory Visit: Payer: Self-pay

## 2020-02-25 ENCOUNTER — Ambulatory Visit: Payer: PRIVATE HEALTH INSURANCE | Admitting: Physical Therapy

## 2020-02-25 DIAGNOSIS — R600 Localized edema: Secondary | ICD-10-CM | POA: Diagnosis not present

## 2020-02-25 DIAGNOSIS — M25511 Pain in right shoulder: Secondary | ICD-10-CM

## 2020-02-25 DIAGNOSIS — M25611 Stiffness of right shoulder, not elsewhere classified: Secondary | ICD-10-CM

## 2020-02-25 NOTE — Therapy (Signed)
[place], [place], [address] Phone: [phone]   Fax:  [phone]  Physical Therapy Treatment  Patient Details  Name: Stacey Kelly MRN: [id number] Date of Birth: 1956/04/16 Referring Provider (PT): RA Theda Sers   Encounter Date: 02/25/2020  PT End of Session - 02/25/20 0841    Visit Number  35    PT Start Time  0802    PT Stop Time  0843    PT Time Calculation (min)  41 min    Activity Tolerance  Patient tolerated treatment well    Behavior During Therapy  [facility] for tasks assessed/performed       Past Medical History:  Diagnosis Date  . Depression   . Heart murmur    ASYMPTOMATIC--  ECHO  1995  MILD REGURG  . Migraine   . OA (osteoarthritis)    shoulders  . PONV (postoperative nausea and vomiting)   . Rotator cuff tear, right   . Vitamin D deficiency   . Wears glasses     Past Surgical History:  Procedure Laterality Date  . BUNIONECTOMY Right 2004   BOTH SIDES OF FOOT  . CRYOTHERAPY    . HARDWARE REMOVAL Right 04/15/2014   Procedure: EXCISION OF RIGHT FOOT DORSAL HARDWARE;  Surgeon: Wylene Simmer, MD;  Location: [place];  Service: Orthopedics;  Laterality: Right;  . RIGHT SHOULDER SURGERY  X3  LAST ONE 2010   INCLUDING ROTATOR CUFF REPAIR /  RECONSTRUCTION  . SHOULDER ARTHROSCOPY WITH ROTATOR CUFF REPAIR Left 03/18/2014   Procedure: LEFT SHOULDER ARTHROSCOPY WITH EVALUATION UNDER ANESTHESIA DEBRIDEMENT SUBACROMIAL DECOMPRESSION DISTAL CLAVICAL RESECTION ROTATOR CUFF REPAIR BICEPS  TENDINOSIS;  Surgeon: Sydnee Cabal, MD;  Location: [place];  Service: Orthopedics;  Laterality: Left;  . SHOULDER ARTHROSCOPY WITH ROTATOR CUFF REPAIR Right 06/23/2019   Procedure: SHOULDER ARTHROSCOPY WITH ROTATOR CUFF REPAIR , biceps tenotomy;  Surgeon: Sydnee Cabal, MD;  Location: [facility];  Service: Orthopedics;  Laterality: Right;  . TONSILLECTOMY   AGE 31    There were no vitals filed for this visit.  Subjective Assessment - 02/25/20 0803    Subjective  "The only think I noticed this morning is my arm is tired" "I wish it didn't fatigue so easily"    Currently in Pain?  No/denies                       [facility] Adult PT Treatment/Exercise - 02/25/20 0001      Shoulder Exercises: Prone   Other Prone Exercises  3 point reat delt 4lb 2x10       Shoulder Exercises: Standing   Other Standing Exercises  RUE from counter top to 3 cabinet level holding 2lbx 10 flex and abd each,  RUE abd 90 ER ball toss green ball 2x5, Drop and catch green ball abd & flex x 10 each    Other Standing Exercises  5lb cable rear flys 2x10,  RUE tricept ext 15lb 2x10, Triceps ext 55lb 2x15, Biceps curls 20lb 2x10       Shoulder Exercises: ROM/Strengthening   UBE (Upper Arm Bike)  constant work 25 watts 3 min each     Other ROM/Strengthening Exercises  Rows 65lb 2x15, Lats 55 2x10 narrow, wide 35lb 2x10, D2 Flex 2lb 2x10    Other ROM/Strengthening Exercises  chest press 25lb 2x15; OHP 3lb 3x5      Manual Therapy   Manual  therapy comments  PROM taken to end range and held, Tight with internal rotation    Passive ROM  IR/ER/Flexion               PT Short Term Goals - 07/03/19 1024      PT SHORT TERM GOAL #1   Title  independent with initial HEP    Status  Achieved        PT Long Term Goals - 02/25/20 0845      PT LONG TERM GOAL #2   Title  AROM of the right shoulder to WFL's    Status  Partially Met      PT LONG TERM GOAL #3   Title  report no difficulty with dressing and doing hair    Status  Achieved      PT LONG TERM GOAL #4   Title  sleep without pain down the arm    Status  Achieved            Plan - 02/25/20 0932    Clinical Impression Statement  Added more resistance with flexion cabinet reaches, she was able to tolerate 7 reps before fatigue. Difficulty achieving full ROM with D2 flexion due to weakness.  Overall open chain weakness remains with activities above 90 degrees.    Stability/Clinical Decision Making  Evolving/Moderate complexity    Rehab Potential  Good    PT Frequency  2x / week    PT Duration  8 weeks    PT Treatment/Interventions  ADLs/Self Care Home Management;Cryotherapy;Electrical Stimulation;Therapeutic activities;Therapeutic exercise;Patient/family education;Manual techniques;Vasopneumatic Device    PT Next Visit Plan  continue to progress scapulohumeral rhythm and strength, easing into OH strengthening       Patient will benefit from skilled therapeutic intervention in order to improve the following deficits and impairments:  Pain, Postural dysfunction, Increased muscle spasms, Decreased scar mobility, Decreased activity tolerance, Decreased range of motion, Decreased strength, Impaired UE functional use, Impaired flexibility, Increased edema  Visit Diagnosis: Acute pain of right shoulder  Localized edema  Stiffness of right shoulder, not elsewhere classified     Problem List Patient Active Problem List   Diagnosis Date Noted  . S/P rotator cuff repair 03/18/2014    Scot Jun, PTA 02/25/2020, 8:45 AM  [address] [address] [place], [place], [address] Phone: [phone]   Fax:  [phone]  Name: Stacey Kelly MRN: [id number] Date of Birth: 1956-06-02

## 2020-02-29 ENCOUNTER — Encounter: Payer: Self-pay | Admitting: Physical Therapy

## 2020-02-29 ENCOUNTER — Ambulatory Visit: Payer: PRIVATE HEALTH INSURANCE | Attending: Specialist | Admitting: Physical Therapy

## 2020-02-29 ENCOUNTER — Other Ambulatory Visit: Payer: Self-pay

## 2020-02-29 DIAGNOSIS — M25511 Pain in right shoulder: Secondary | ICD-10-CM | POA: Diagnosis present

## 2020-02-29 DIAGNOSIS — M25611 Stiffness of right shoulder, not elsewhere classified: Secondary | ICD-10-CM | POA: Insufficient documentation

## 2020-02-29 DIAGNOSIS — R600 Localized edema: Secondary | ICD-10-CM | POA: Diagnosis present

## 2020-02-29 NOTE — Therapy (Signed)
[place], [place], [address] Phone: [phone]   Fax:  [phone]  Physical Therapy Treatment  Patient Details  Name: Stacey Kelly MRN: [id number] Date of Birth: [birth year] Referring Provider (PT): RA Theda Sers   Encounter Date: 02/29/2020  PT End of Session - 02/29/20 0845    Visit Number  61    Authorization Type  W/C    PT Start Time  0805    PT Stop Time  0845    PT Time Calculation (min)  40 min       Past Medical History:  Diagnosis Date  . Depression   . Heart murmur    ASYMPTOMATIC--  ECHO  1995  MILD REGURG  . Migraine   . OA (osteoarthritis)    shoulders  . PONV (postoperative nausea and vomiting)   . Rotator cuff tear, right   . Vitamin D deficiency   . Wears glasses     Past Surgical History:  Procedure Laterality Date  . BUNIONECTOMY Right 2004   BOTH SIDES OF FOOT  . CRYOTHERAPY    . HARDWARE REMOVAL Right 04/15/2014   Procedure: EXCISION OF RIGHT FOOT DORSAL HARDWARE;  Surgeon: Wylene Simmer, MD;  Location: [place];  Service: Orthopedics;  Laterality: Right;  . RIGHT SHOULDER SURGERY  X3  LAST ONE 2010   INCLUDING ROTATOR CUFF REPAIR /  RECONSTRUCTION  . SHOULDER ARTHROSCOPY WITH ROTATOR CUFF REPAIR Left 03/18/2014   Procedure: LEFT SHOULDER ARTHROSCOPY WITH EVALUATION UNDER ANESTHESIA DEBRIDEMENT SUBACROMIAL DECOMPRESSION DISTAL CLAVICAL RESECTION ROTATOR CUFF REPAIR BICEPS  TENDINOSIS;  Surgeon: Sydnee Cabal, MD;  Location: [place];  Service: Orthopedics;  Laterality: Left;  . SHOULDER ARTHROSCOPY WITH ROTATOR CUFF REPAIR Right 06/23/2019   Procedure: SHOULDER ARTHROSCOPY WITH ROTATOR CUFF REPAIR , biceps tenotomy;  Surgeon: Sydnee Cabal, MD;  Location: [facility];  Service: Orthopedics;  Laterality: Right;  . TONSILLECTOMY  AGE 34    There were no vitals filed for this visit.  Subjective Assessment - 02/29/20  0807    Subjective  Still going to the gym. The arm is not the first thing on her mind anymore, doe snot feel confident with overhead activities. Empty dishwasher one dish at a time. Has some difficulty spiking IV bags overhead.    Currently in Pain?  No/denies                       [facility] Adult PT Treatment/Exercise - 02/29/20 0001      Shoulder Exercises: Prone   Other Prone Exercises  3 point reat delt 4lb 2x10     Other Prone Exercises  3pt read delt then ER 1lb x10 x5       Shoulder Exercises: Standing   Other Standing Exercises  RUE from counter top to 3 cabinet level holding 3lb 2x5 flex and (2nd level abd) each,  RUE abd 90 ER ball toss green ball 2x5, Drop and catch green ball abd & flex x 10 each    Other Standing Exercises  RUE tricept ext 15lb 2x10, Triceps ext 55lb 2x15, Biceps curls 20lb 2x10       Shoulder Exercises: ROM/Strengthening   UBE (Upper Arm Bike)  constant work 25 watts 3 min each     Other ROM/Strengthening Exercises  Rows 65lb 2x15, Lats 55 2x15 narrow, wide 25lb x20, D    Other ROM/Strengthening Exercises  chest press 35lb 2x15;  OHP 3lb 3x5      Manual Therapy   Manual therapy comments  PROM taken to end range and held, Tight with internal rotation    Passive ROM  IR/ER/Flexion               PT Short Term Goals - 07/03/19 1024      PT SHORT TERM GOAL #1   Title  independent with initial HEP    Status  Achieved        PT Long Term Goals - 02/29/20 0846      PT LONG TERM GOAL #5   Title  lift 5# overhead with right arm    Status  Partially Met   3lb today           Plan - 02/29/20 0847    Clinical Impression Statement  Good effort overall. She reports a lack of confidence with overhead activities. Today's she was able to place 3lb dumbbell oberhead. All pulling and pushing motions closed chain remain strong, but pt remains very weak thigh open chain movements above 90.    Stability/Clinical Decision Making   Evolving/Moderate complexity    Rehab Potential  Good    PT Frequency  2x / week    PT Duration  8 weeks    PT Treatment/Interventions  ADLs/Self Care Home Management;Cryotherapy;Electrical Stimulation;Therapeutic activities;Therapeutic exercise;Patient/family education;Manual techniques;Vasopneumatic Device    PT Next Visit Plan  continue to progress scapulohumeral rhythm and strength, easing into OH strengthening       Patient will benefit from skilled therapeutic intervention in order to improve the following deficits and impairments:  Pain, Postural dysfunction, Increased muscle spasms, Decreased scar mobility, Decreased activity tolerance, Decreased range of motion, Decreased strength, Impaired UE functional use, Impaired flexibility, Increased edema  Visit Diagnosis: Acute pain of right shoulder  Localized edema  Stiffness of right shoulder, not elsewhere classified     Problem List Patient Active Problem List   Diagnosis Date Noted  . S/P rotator cuff repair 03/18/2014    Scot Jun, PTA 02/29/2020, 8:48 AM  [place], [place], [address] Phone: [phone]   Fax:  [phone]  Name: Stacey Kelly MRN: [id number] Date of Birth: [birth year]

## 2020-03-03 ENCOUNTER — Encounter: Payer: Self-pay | Admitting: Physical Therapy

## 2020-03-03 ENCOUNTER — Other Ambulatory Visit: Payer: Self-pay

## 2020-03-03 ENCOUNTER — Ambulatory Visit: Payer: PRIVATE HEALTH INSURANCE | Admitting: Physical Therapy

## 2020-03-03 DIAGNOSIS — M25511 Pain in right shoulder: Secondary | ICD-10-CM

## 2020-03-03 DIAGNOSIS — R600 Localized edema: Secondary | ICD-10-CM

## 2020-03-03 NOTE — Therapy (Signed)
[place], [place], [address] Phone: [phone]   Fax:  [phone]  Physical Therapy Treatment  Patient Details  Name: GENESSIS FLANARY MRN: [id number] Date of Birth: [birth year] Referring Provider (PT): RA Theda Sers   Encounter Date: 03/03/2020  PT End of Session - 03/03/20 0842    Visit Number  28    Authorization Type  W/C    PT Start Time  0803    PT Stop Time  0843    PT Time Calculation (min)  40 min    Activity Tolerance  Patient tolerated treatment well    Behavior During Therapy  [facility] for tasks assessed/performed       Past Medical History:  Diagnosis Date  . Depression   . Heart murmur    ASYMPTOMATIC--  ECHO  1995  MILD REGURG  . Migraine   . OA (osteoarthritis)    shoulders  . PONV (postoperative nausea and vomiting)   . Rotator cuff tear, right   . Vitamin D deficiency   . Wears glasses     Past Surgical History:  Procedure Laterality Date  . BUNIONECTOMY Right 2004   BOTH SIDES OF FOOT  . CRYOTHERAPY    . HARDWARE REMOVAL Right 04/15/2014   Procedure: EXCISION OF RIGHT FOOT DORSAL HARDWARE;  Surgeon: Wylene Simmer, MD;  Location: [place];  Service: Orthopedics;  Laterality: Right;  . RIGHT SHOULDER SURGERY  X3  LAST ONE 2010   INCLUDING ROTATOR CUFF REPAIR /  RECONSTRUCTION  . SHOULDER ARTHROSCOPY WITH ROTATOR CUFF REPAIR Left 03/18/2014   Procedure: LEFT SHOULDER ARTHROSCOPY WITH EVALUATION UNDER ANESTHESIA DEBRIDEMENT SUBACROMIAL DECOMPRESSION DISTAL CLAVICAL RESECTION ROTATOR CUFF REPAIR BICEPS  TENDINOSIS;  Surgeon: Sydnee Cabal, MD;  Location: [place];  Service: Orthopedics;  Laterality: Left;  . SHOULDER ARTHROSCOPY WITH ROTATOR CUFF REPAIR Right 06/23/2019   Procedure: SHOULDER ARTHROSCOPY WITH ROTATOR CUFF REPAIR , biceps tenotomy;  Surgeon: Sydnee Cabal, MD;  Location: [facility];  Service: Orthopedics;  Laterality:  Right;  . TONSILLECTOMY  AGE 64    There were no vitals filed for this visit.  Subjective Assessment - 03/03/20 0808    Subjective  Some muscle tenderness form massage yesterday    Currently in Pain?  No/denies                       [facility] Adult PT Treatment/Exercise - 03/03/20 0001      Shoulder Exercises: Prone   Other Prone Exercises  3 point reat delt 5lb 2x10     Other Prone Exercises  3pt horiz abd 3lb 2x10, then flexion 1lb 2x10       Shoulder Exercises: Standing   Extension  Weights;Both;15 reps   x2   Extension Weight (lbs)  20    Other Standing Exercises  5lb cable rear flys 2x10,  RUE tricept ext 15lb 2x10, Triceps ext 55lb 2x15, Biceps curls 20lb 2x10       Shoulder Exercises: ROM/Strengthening   UBE (Upper Arm Bike)  constant work 25 watts 3 min each     Other ROM/Strengthening Exercises  Rows 65lb 2x15, Lats 55 2x15 narrow, wide 25lb x20,     Other ROM/Strengthening Exercises  chest press 35lb 2x15; OHP 3lb 3x5               PT Short Term Goals - 07/03/19 1024      PT SHORT  TERM GOAL #1   Title  independent with initial HEP    Status  Achieved        PT Long Term Goals - 02/29/20 0846      PT LONG TERM GOAL #5   Title  lift 5# overhead with right arm    Status  Partially Met   3lb today           Plan - 03/03/20 0843    Clinical Impression Statement  Pt continues to do well and give good effort overall. She was able to complete OHP today without assist. strength remains good with closed chain pulling interventions. Progressed with some more open chain interventions with light resistance. Difficulty with bent over 3 point flexion.    Stability/Clinical Decision Making  Evolving/Moderate complexity    Rehab Potential  Good    PT Frequency  2x / week    PT Duration  8 weeks    PT Treatment/Interventions  ADLs/Self Care Home Management;Cryotherapy;Electrical Stimulation;Therapeutic activities;Therapeutic  exercise;Patient/family education;Manual techniques;Vasopneumatic Device    PT Next Visit Plan  continue to progress scapulohumeral rhythm and strength, easing into OH strengthening       Patient will benefit from skilled therapeutic intervention in order to improve the following deficits and impairments:  Pain, Postural dysfunction, Increased muscle spasms, Decreased scar mobility, Decreased activity tolerance, Decreased range of motion, Decreased strength, Impaired UE functional use, Impaired flexibility, Increased edema  Visit Diagnosis: Acute pain of right shoulder  Localized edema     Problem List Patient Active Problem List   Diagnosis Date Noted  . S/P rotator cuff repair 03/18/2014    Scot Jun, PTA 03/03/2020, 8:45 AM  [place], [place], [address] Phone: [phone]   Fax:  [phone]  Name: ORELLA CUSHMAN MRN: [id number] Date of Birth: June 28, 1956

## 2020-03-07 ENCOUNTER — Ambulatory Visit: Payer: PRIVATE HEALTH INSURANCE | Attending: Specialist | Admitting: Physical Therapy

## 2020-03-07 ENCOUNTER — Encounter: Payer: Self-pay | Admitting: Physical Therapy

## 2020-03-07 ENCOUNTER — Other Ambulatory Visit: Payer: Self-pay

## 2020-03-07 DIAGNOSIS — M25611 Stiffness of right shoulder, not elsewhere classified: Secondary | ICD-10-CM | POA: Insufficient documentation

## 2020-03-07 DIAGNOSIS — R600 Localized edema: Secondary | ICD-10-CM | POA: Insufficient documentation

## 2020-03-07 DIAGNOSIS — M25511 Pain in right shoulder: Secondary | ICD-10-CM | POA: Diagnosis present

## 2020-03-07 NOTE — Therapy (Signed)
[place], [place], [address] Phone: [phone]   Fax:  [phone]  Physical Therapy Treatment  Patient Details  Name: Stacey Kelly MRN: [id number] Date of Birth: 1956-03-18 Referring Provider (PT): RA Theda Sers   Encounter Date: 03/07/2020  PT End of Session - 03/07/20 0842    Visit Number  39    Authorization Type  W/C    PT Start Time  0803    PT Stop Time  0843    PT Time Calculation (min)  40 min    Activity Tolerance  Patient tolerated treatment well    Behavior During Therapy  [facility] for tasks assessed/performed       Past Medical History:  Diagnosis Date  . Depression   . Heart murmur    ASYMPTOMATIC--  ECHO  1995  MILD REGURG  . Migraine   . OA (osteoarthritis)    shoulders  . PONV (postoperative nausea and vomiting)   . Rotator cuff tear, right   . Vitamin D deficiency   . Wears glasses     Past Surgical History:  Procedure Laterality Date  . BUNIONECTOMY Right 2004   BOTH SIDES OF FOOT  . CRYOTHERAPY    . HARDWARE REMOVAL Right 04/15/2014   Procedure: EXCISION OF RIGHT FOOT DORSAL HARDWARE;  Surgeon: Wylene Simmer, MD;  Location: [facility];  Service: Orthopedics;  Laterality: Right;  . RIGHT SHOULDER SURGERY  X3  LAST ONE 2010   INCLUDING ROTATOR CUFF REPAIR /  RECONSTRUCTION  . SHOULDER ARTHROSCOPY WITH ROTATOR CUFF REPAIR Left 03/18/2014   Procedure: LEFT SHOULDER ARTHROSCOPY WITH EVALUATION UNDER ANESTHESIA DEBRIDEMENT SUBACROMIAL DECOMPRESSION DISTAL CLAVICAL RESECTION ROTATOR CUFF REPAIR BICEPS  TENDINOSIS;  Surgeon: Sydnee Cabal, MD;  Location: [facility];  Service: Orthopedics;  Laterality: Left;  . SHOULDER ARTHROSCOPY WITH ROTATOR CUFF REPAIR Right 06/23/2019   Procedure: SHOULDER ARTHROSCOPY WITH ROTATOR CUFF REPAIR , biceps tenotomy;  Surgeon: Sydnee Cabal, MD;  Location: [facility];  Service: Orthopedics;   Laterality: Right;  . TONSILLECTOMY  AGE 65    There were no vitals filed for this visit.  Subjective Assessment - 03/07/20 0807    Subjective  "ok, Just stiff"    Currently in Pain?  No/denies                       [facility] Adult PT Treatment/Exercise - 03/07/20 0001      Shoulder Exercises: Prone   Other Prone Exercises  3pt horiz abd 3lb 2x10, then flexion 1lb 2x10       Shoulder Exercises: Standing   Flexion  Right;Strengthening;20 reps;Theraband    Theraband Level (Shoulder Flexion)  Level 1 (Yellow)    ABduction  Right;Theraband;20 reps    Theraband Level (Shoulder ABduction)  Level 1 (Yellow)    Other Standing Exercises  RUE from counter top to 3 cabinet level holding 4lb 2x5 flex and (2nd level abd) each,  RUE abd 90 ER ball toss green ball 2x5, Drop and catch green ball abd & flex x 10 each      Shoulder Exercises: ROM/Strengthening   UBE (Upper Arm Bike)  constant work 25 watts 3 min each     Other ROM/Strengthening Exercises  Rows 65lb 2x15, Lats 55 2x10 narrow, wide 25lb x20,       Manual Therapy   Manual Therapy  Joint mobilization;Passive ROM    Manual therapy comments  PROM taken to end range and held, Tight with internal rotation    Passive ROM  IR/ER/Flexion               PT Short Term Goals - 07/03/19 1024      PT SHORT TERM GOAL #1   Title  independent with initial HEP    Status  Achieved        PT Long Term Goals - 02/29/20 0846      PT LONG TERM GOAL #5   Title  lift 5# overhead with right arm    Status  Partially Met   3lb today           Plan - 03/07/20 0843    Clinical Impression Statement  Pt able to progress it 4lb resistance overhead reaches but fatigues after 3 reps and it becomes difficult. No reports of pain today. Good strength with closed chain pulling interventions. Tightness remains with internal rotation, tactile cues needed with IR to assist with shoulder protraction.    Stability/Clinical Decision  Making  Evolving/Moderate complexity    Rehab Potential  Good    PT Frequency  2x / week    PT Treatment/Interventions  ADLs/Self Care Home Management;Cryotherapy;Electrical Stimulation;Therapeutic activities;Therapeutic exercise;Patient/family education;Manual techniques;Vasopneumatic Device    PT Next Visit Plan  continue to progress scapulohumeral rhythm and strength, easing into OH strengthening       Patient will benefit from skilled therapeutic intervention in order to improve the following deficits and impairments:  Pain, Postural dysfunction, Increased muscle spasms, Decreased scar mobility, Decreased activity tolerance, Decreased range of motion, Decreased strength, Impaired UE functional use, Impaired flexibility, Increased edema  Visit Diagnosis: Stiffness of right shoulder, not elsewhere classified  Acute pain of right shoulder  Localized edema     Problem List Patient Active Problem List   Diagnosis Date Noted  . S/P rotator cuff repair 03/18/2014    Scot Jun, PTA 03/07/2020, 8:45 AM  [place] [address] [place], [place], [address] Phone: [phone]   Fax:  [phone]  Name: Stacey Kelly MRN: [id number] Date of Birth: 11-Mar-1956

## 2020-03-10 ENCOUNTER — Other Ambulatory Visit: Payer: Self-pay

## 2020-03-10 ENCOUNTER — Encounter: Payer: Self-pay | Admitting: Physical Therapy

## 2020-03-10 ENCOUNTER — Ambulatory Visit: Payer: PRIVATE HEALTH INSURANCE | Attending: Specialist | Admitting: Physical Therapy

## 2020-03-10 DIAGNOSIS — M25611 Stiffness of right shoulder, not elsewhere classified: Secondary | ICD-10-CM | POA: Insufficient documentation

## 2020-03-10 DIAGNOSIS — M25511 Pain in right shoulder: Secondary | ICD-10-CM | POA: Diagnosis present

## 2020-03-10 DIAGNOSIS — R600 Localized edema: Secondary | ICD-10-CM | POA: Insufficient documentation

## 2020-03-10 NOTE — Therapy (Signed)
[place], [place], [address] Phone: [phone]   Fax:  [phone]  Physical Therapy Treatment  Patient Details  Name: Stacey Kelly MRN: [id number] Date of Birth: December 19, 1955 Referring Provider (PT): RA Theda Sers   Encounter Date: 03/10/2020  PT End of Session - 03/10/20 0843    Visit Number  30    Authorization Type  W/C    PT Start Time  0806    PT Stop Time  0845    PT Time Calculation (min)  39 min    Activity Tolerance  Patient tolerated treatment well    Behavior During Therapy  [facility] for tasks assessed/performed       Past Medical History:  Diagnosis Date  . Depression   . Heart murmur    ASYMPTOMATIC--  ECHO  1995  MILD REGURG  . Migraine   . OA (osteoarthritis)    shoulders  . PONV (postoperative nausea and vomiting)   . Rotator cuff tear, right   . Vitamin D deficiency   . Wears glasses     Past Surgical History:  Procedure Laterality Date  . BUNIONECTOMY Right 2004   BOTH SIDES OF FOOT  . CRYOTHERAPY    . HARDWARE REMOVAL Right 04/15/2014   Procedure: EXCISION OF RIGHT FOOT DORSAL HARDWARE;  Surgeon: Wylene Simmer, MD;  Location: [facility];  Service: Orthopedics;  Laterality: Right;  . RIGHT SHOULDER SURGERY  X3  LAST ONE 2010   INCLUDING ROTATOR CUFF REPAIR /  RECONSTRUCTION  . SHOULDER ARTHROSCOPY WITH ROTATOR CUFF REPAIR Left 03/18/2014   Procedure: LEFT SHOULDER ARTHROSCOPY WITH EVALUATION UNDER ANESTHESIA DEBRIDEMENT SUBACROMIAL DECOMPRESSION DISTAL CLAVICAL RESECTION ROTATOR CUFF REPAIR BICEPS  TENDINOSIS;  Surgeon: Sydnee Cabal, MD;  Location: [place];  Service: Orthopedics;  Laterality: Left;  . SHOULDER ARTHROSCOPY WITH ROTATOR CUFF REPAIR Right 06/23/2019   Procedure: SHOULDER ARTHROSCOPY WITH ROTATOR CUFF REPAIR , biceps tenotomy;  Surgeon: Sydnee Cabal, MD;  Location: [facility];  Service: Orthopedics;   Laterality: Right;  . TONSILLECTOMY  AGE 64    There were no vitals filed for this visit.  Subjective Assessment - 03/10/20 0808    Subjective  "I don't know what I did but I have pain in both shoulders"    Currently in Pain?  Yes    Pain Score  2     Pain Location  Shoulder    Pain Orientation  Left;Right         [facility] PT Assessment - 03/10/20 0001      AROM   Right Shoulder Flexion  180 Degrees    Right Shoulder ABduction  180 Degrees    Right Shoulder Internal Rotation  55 Degrees   supine   Right Shoulder External Rotation  88 Degrees      Strength   Right Shoulder Flexion  3+/5    Right Shoulder ABduction  3+/5                    [facility] Adult PT Treatment/Exercise - 03/10/20 0001      Shoulder Exercises: Prone   Other Prone Exercises  3pt row 10l 2x10, then rear delt 3lb 2x10     Other Prone Exercises  3pt horiz abd 3lb 2x10, then flexion 1lb 2x10       Shoulder Exercises: Standing   Other Standing Exercises  Flex-horiz abd- down 2lb 2x5      Shoulder  Exercises: ROM/Strengthening   UBE (Upper Arm Bike)  constant work 25 watts 3 min each     Other ROM/Strengthening Exercises  Rows 65lb 2x15, Lats 55 2x15 narrow, wide 25lb x20,     Other ROM/Strengthening Exercises  chest press 25lb 2x15      Manual Therapy   Manual Therapy  Joint mobilization;Passive ROM    Manual therapy comments  PROM taken to end range and held, Tight with internal rotation    Passive ROM  IR/ER/Flexion               PT Short Term Goals - 07/03/19 1024      PT SHORT TERM GOAL #1   Title  independent with initial HEP    Status  Achieved        PT Long Term Goals - 02/29/20 0846      PT LONG TERM GOAL #5   Title  lift 5# overhead with right arm    Status  Partially Met   3lb today           Plan - 03/10/20 0844    Clinical Impression Statement  AROM remains good except for internal rotation. Flexion and abduction remains very weak but it gets stronger  and her arm lowers below 90. She reports moe discomfort in both shoulder that's he thinks comes from work. Tactile cues to prevent R shoulder protrusion with passive IR.    Stability/Clinical Decision Making  Evolving/Moderate complexity    Rehab Potential  Good    PT Frequency  2x / week    PT Duration  8 weeks    PT Treatment/Interventions  ADLs/Self Care Home Management;Cryotherapy;Electrical Stimulation;Therapeutic activities;Therapeutic exercise;Patient/family education;Manual techniques;Vasopneumatic Device    PT Next Visit Plan  continue to progress scapulohumeral rhythm and strength, easing into OH strengthening       Patient will benefit from skilled therapeutic intervention in order to improve the following deficits and impairments:  Pain, Postural dysfunction, Increased muscle spasms, Decreased scar mobility, Decreased activity tolerance, Decreased range of motion, Decreased strength, Impaired UE functional use, Impaired flexibility, Increased edema  Visit Diagnosis: Stiffness of right shoulder, not elsewhere classified  Localized edema  Acute pain of right shoulder     Problem List Patient Active Problem List   Diagnosis Date Noted  . S/P rotator cuff repair 03/18/2014    Scot Jun, PTA 03/10/2020, 8:46 AM  [place] [address] [place], [place], [address] Phone: [phone]   Fax:  [phone]  Name: Stacey Kelly MRN: [id number] Date of Birth: Nov 08, 1955

## 2020-03-14 ENCOUNTER — Other Ambulatory Visit: Payer: Self-pay

## 2020-03-14 ENCOUNTER — Ambulatory Visit: Payer: PRIVATE HEALTH INSURANCE | Admitting: Physical Therapy

## 2020-03-14 ENCOUNTER — Encounter: Payer: Self-pay | Admitting: Physical Therapy

## 2020-03-14 DIAGNOSIS — M25511 Pain in right shoulder: Secondary | ICD-10-CM | POA: Diagnosis not present

## 2020-03-14 DIAGNOSIS — M25611 Stiffness of right shoulder, not elsewhere classified: Secondary | ICD-10-CM

## 2020-03-14 DIAGNOSIS — R600 Localized edema: Secondary | ICD-10-CM

## 2020-03-14 NOTE — Therapy (Signed)
[place], [place], [address] Phone: [phone]   Fax:  [phone]  Physical Therapy Treatment  Patient Details  Name: Stacey Kelly MRN: [id number] Date of Birth: 1956-10-17 Referring Provider (PT): RA Theda Sers   Encounter Date: 03/14/2020  PT End of Session - 03/14/20 0844    Visit Number  78    Authorization Type  W/C    PT Start Time  0805    PT Stop Time  0845    PT Time Calculation (min)  40 min    Activity Tolerance  Patient tolerated treatment well    Behavior During Therapy  [facility] for tasks assessed/performed       Past Medical History:  Diagnosis Date  . Depression   . Heart murmur    ASYMPTOMATIC--  ECHO  1995  MILD REGURG  . Migraine   . OA (osteoarthritis)    shoulders  . PONV (postoperative nausea and vomiting)   . Rotator cuff tear, right   . Vitamin D deficiency   . Wears glasses     Past Surgical History:  Procedure Laterality Date  . BUNIONECTOMY Right 2004   BOTH SIDES OF FOOT  . CRYOTHERAPY    . HARDWARE REMOVAL Right 04/15/2014   Procedure: EXCISION OF RIGHT FOOT DORSAL HARDWARE;  Surgeon: Wylene Simmer, MD;  Location: [facility];  Service: Orthopedics;  Laterality: Right;  . RIGHT SHOULDER SURGERY  X3  LAST ONE 2010   INCLUDING ROTATOR CUFF REPAIR /  RECONSTRUCTION  . SHOULDER ARTHROSCOPY WITH ROTATOR CUFF REPAIR Left 03/18/2014   Procedure: LEFT SHOULDER ARTHROSCOPY WITH EVALUATION UNDER ANESTHESIA DEBRIDEMENT SUBACROMIAL DECOMPRESSION DISTAL CLAVICAL RESECTION ROTATOR CUFF REPAIR BICEPS  TENDINOSIS;  Surgeon: Sydnee Cabal, MD;  Location: [facility];  Service: Orthopedics;  Laterality: Left;  . SHOULDER ARTHROSCOPY WITH ROTATOR CUFF REPAIR Right 06/23/2019   Procedure: SHOULDER ARTHROSCOPY WITH ROTATOR CUFF REPAIR , biceps tenotomy;  Surgeon: Sydnee Cabal, MD;  Location: [facility];  Service: Orthopedics;   Laterality: Right;  . TONSILLECTOMY  AGE 11    There were no vitals filed for this visit.  Subjective Assessment - 03/14/20 0807    Subjective  "Pretty good just stiff"    Currently in Pain?  No/denies                        [facility] Adult PT Treatment/Exercise - 03/14/20 0001      Shoulder Exercises: Standing   ABduction  Right;20 reps;Weights;Strengthening    Shoulder ABduction Weight (lbs)  1    Other Standing Exercises  Flex-horiz abd- down 2lb 2x5, Triceps ext 45lb 2x15, Biceps curls 25lb 2x15      Other Standing Exercises  5lb cable rear flys 2x10, OHP 2lb 2x10       Shoulder Exercises: ROM/Strengthening   UBE (Upper Arm Bike)  constant work 25 watts 3 min each     Other ROM/Strengthening Exercises  Rows 65lb 2x15, Lats 55 2x10 narrow, wide 25lb x20,     Other ROM/Strengthening Exercises  chest press 35lb 2x15      Manual Therapy   Manual Therapy  Joint mobilization;Passive ROM    Manual therapy comments  PROM taken to end range and held, Tight with internal rotation    Passive ROM  IR/ER/Flexion               PT Short Term Goals - 07/03/19  1024      PT SHORT TERM GOAL #1   Title  independent with initial HEP    Status  Achieved        PT Long Term Goals - 02/29/20 0846      PT LONG TERM GOAL #5   Title  lift 5# overhead with right arm    Status  Partially Met   3lb today           Plan - 03/14/20 0845    Clinical Impression Statement  Continues with open chain exercises with arm away from body, these motions remains very weak. Was only able to do a few reps of OHP with 3lb before having to decrease to 2lb. She has been able to do 3lb in the past. Some shoulder elevation noted with abduction. Rows and lat remains strong.    Stability/Clinical Decision Making  Evolving/Moderate complexity    Rehab Potential  Good    PT Frequency  2x / week    PT Duration  8 weeks    PT Treatment/Interventions  ADLs/Self Care Home  Management;Cryotherapy;Electrical Stimulation;Therapeutic activities;Therapeutic exercise;Patient/family education;Manual techniques;Vasopneumatic Device    PT Next Visit Plan  continue to progress scapulohumeral rhythm and strength, easing into OH strengthening       Patient will benefit from skilled therapeutic intervention in order to improve the following deficits and impairments:  Pain, Postural dysfunction, Increased muscle spasms, Decreased scar mobility, Decreased activity tolerance, Decreased range of motion, Decreased strength, Impaired UE functional use, Impaired flexibility, Increased edema  Visit Diagnosis: Localized edema  Acute pain of right shoulder  Stiffness of right shoulder, not elsewhere classified     Problem List Patient Active Problem List   Diagnosis Date Noted  . S/P rotator cuff repair 03/18/2014    Scot Jun 03/14/2020, 8:47 AM  [place] [address] [place], [place], [address] Phone: [phone]   Fax:  [phone]  Name: Stacey Kelly MRN: [id number] Date of Birth: 03-23-1956

## 2020-03-17 ENCOUNTER — Encounter: Payer: Self-pay | Admitting: Physical Therapy

## 2020-03-17 ENCOUNTER — Ambulatory Visit: Payer: PRIVATE HEALTH INSURANCE | Admitting: Physical Therapy

## 2020-03-17 ENCOUNTER — Other Ambulatory Visit: Payer: Self-pay

## 2020-03-17 DIAGNOSIS — M25511 Pain in right shoulder: Secondary | ICD-10-CM

## 2020-03-17 DIAGNOSIS — M25611 Stiffness of right shoulder, not elsewhere classified: Secondary | ICD-10-CM

## 2020-03-17 DIAGNOSIS — R600 Localized edema: Secondary | ICD-10-CM

## 2020-03-17 NOTE — Therapy (Signed)
[place] Kelly, [place], [address] Phone: [phone]   Fax:  [phone]  Physical Therapy Treatment  Patient Details  Name: Stacey Kelly MRN: [id number] Date of Birth: 1956/08/13 Referring Provider (PT): RA Theda Sers   Encounter Date: 03/17/2020  PT End of Session - 03/17/20 0844    Visit Number  68    Authorization Type  W/C    PT Start Time  0803    PT Stop Time  0845    PT Time Calculation (min)  42 min    Activity Tolerance  Patient tolerated treatment well    Behavior During Therapy  [facility] for tasks assessed/performed       Past Medical History:  Diagnosis Date  . Depression   . Heart murmur    ASYMPTOMATIC--  ECHO  1995  MILD REGURG  . Migraine   . OA (osteoarthritis)    shoulders  . PONV (postoperative nausea and vomiting)   . Rotator cuff tear, right   . Vitamin D deficiency   . Wears glasses     Past Surgical History:  Procedure Laterality Date  . BUNIONECTOMY Right 2004   BOTH SIDES OF FOOT  . CRYOTHERAPY    . HARDWARE REMOVAL Right 04/15/2014   Procedure: EXCISION OF RIGHT FOOT DORSAL HARDWARE;  Surgeon: Wylene Simmer, MD;  Location: [facility];  Service: Orthopedics;  Laterality: Right;  . RIGHT SHOULDER SURGERY  X3  LAST ONE 2010   INCLUDING ROTATOR CUFF REPAIR /  RECONSTRUCTION  . SHOULDER ARTHROSCOPY WITH ROTATOR CUFF REPAIR Left 03/18/2014   Procedure: LEFT SHOULDER ARTHROSCOPY WITH EVALUATION UNDER ANESTHESIA DEBRIDEMENT SUBACROMIAL DECOMPRESSION DISTAL CLAVICAL RESECTION ROTATOR CUFF REPAIR BICEPS  TENDINOSIS;  Surgeon: Sydnee Cabal, MD;  Location: [place];  Service: Orthopedics;  Laterality: Left;  . SHOULDER ARTHROSCOPY WITH ROTATOR CUFF REPAIR Right 06/23/2019   Procedure: SHOULDER ARTHROSCOPY WITH ROTATOR CUFF REPAIR , biceps tenotomy;  Surgeon: Sydnee Cabal, MD;  Location: [facility];  Service: Orthopedics;   Laterality: Right;  . TONSILLECTOMY  AGE 64    There were no vitals filed for this visit.  Subjective Assessment - 03/17/20 0804    Subjective  "Just a little stiff, but not bad"    Currently in Pain?  No/denies                        [facility] Adult PT Treatment/Exercise - 03/17/20 0001      Shoulder Exercises: Sidelying   Other Sidelying Exercises  Drop and catch ER green ball 2x10       Shoulder Exercises: Standing   ABduction  Right;20 reps;Weights;Strengthening    Shoulder ABduction Weight (lbs)  1    Extension  Weights;Both;15 reps   x2   Extension Weight (lbs)  20    Other Standing Exercises  RUE from counter top to 3 cabinet level holding 4lb 2x5 flex and (2nd level abd) each,  RUE abd 90 ER ball toss green ball 2x5, Drop and catch green ball abd & flex x 10 each    Other Standing Exercises  5lb cable rear flys 2x10, OHP 2lb 2x5       Shoulder Exercises: ROM/Strengthening   UBE (Upper Arm Bike)  constant work 25 watts 3 min each     Other ROM/Strengthening Exercises  Rows 65lb 2x15, Lats 55 2x10 narrow, wide 25lb x20,     Other ROM/Strengthening Exercises  chest press 35lb 2x15      Manual Therapy   Manual therapy comments  PROM taken to end range and held, Tight with internal rotation tactile cues needed to keep shoulder down    Passive ROM  IR/ER/Flexion               PT Short Term Goals - 07/03/19 1024      PT SHORT TERM GOAL #1   Title  independent with initial HEP    Status  Achieved        PT Long Term Goals - 03/17/20 0844      PT LONG TERM GOAL #5   Title  lift 5# overhead with right arm    Status  Partially Met            Plan - 03/17/20 0845    Clinical Impression Statement  Pt able to reach over her head with 4lb resistance, but did fatigue after 3 reps. Some shoulder elevation present with active shoulder abduction. Overall difficulty present with motions away from body    Stability/Clinical Decision Making   Evolving/Moderate complexity    Rehab Potential  Good    PT Frequency  2x / week    PT Treatment/Interventions  ADLs/Self Care Home Management;Cryotherapy;Electrical Stimulation;Therapeutic activities;Therapeutic exercise;Patient/family education;Manual techniques;Vasopneumatic Device    PT Next Visit Plan  continue to progress scapulohumeral rhythm and strength, easing into OH strengthening       Patient will benefit from skilled therapeutic intervention in order to improve the following deficits and impairments:  Pain, Postural dysfunction, Increased muscle spasms, Decreased scar mobility, Decreased activity tolerance, Decreased range of motion, Decreased strength, Impaired UE functional use, Impaired flexibility, Increased edema  Visit Diagnosis: Stiffness of right shoulder, not elsewhere classified  Acute pain of right shoulder  Localized edema     Problem List Patient Active Problem List   Diagnosis Date Noted  . S/P rotator cuff repair 03/18/2014    Scot Jun 03/17/2020, 8:51 AM  [place] [address] [place], [place], [address] Phone: [phone]   Fax:  [phone]  Name: Stacey Kelly MRN: [id number] Date of Birth: May 21, 1956

## 2020-03-21 ENCOUNTER — Ambulatory Visit: Payer: PRIVATE HEALTH INSURANCE | Attending: Specialist | Admitting: Physical Therapy

## 2020-03-21 ENCOUNTER — Encounter: Payer: Self-pay | Admitting: Physical Therapy

## 2020-03-21 ENCOUNTER — Other Ambulatory Visit: Payer: Self-pay

## 2020-03-21 ENCOUNTER — Ambulatory Visit: Payer: PRIVATE HEALTH INSURANCE | Admitting: Physical Therapy

## 2020-03-21 DIAGNOSIS — R600 Localized edema: Secondary | ICD-10-CM | POA: Insufficient documentation

## 2020-03-21 DIAGNOSIS — M25611 Stiffness of right shoulder, not elsewhere classified: Secondary | ICD-10-CM | POA: Diagnosis not present

## 2020-03-21 DIAGNOSIS — M25511 Pain in right shoulder: Secondary | ICD-10-CM | POA: Insufficient documentation

## 2020-03-21 NOTE — Therapy (Signed)
[place], [place], [address] Phone: [phone]   Fax:  [phone]  Physical Therapy Treatment  Patient Details  Name: Stacey Kelly MRN: [id number] Date of Birth: [birth year] Referring Provider (PT): RA Theda Sers   Encounter Date: 03/21/2020  PT End of Session - 03/21/20 0841    Visit Number  4    PT Start Time  0806    PT Stop Time  0845    PT Time Calculation (min)  39 min    Activity Tolerance  Patient tolerated treatment well    Behavior During Therapy  [facility] for tasks assessed/performed       Past Medical History:  Diagnosis Date  . Depression   . Heart murmur    ASYMPTOMATIC--  ECHO  1995  MILD REGURG  . Migraine   . OA (osteoarthritis)    shoulders  . PONV (postoperative nausea and vomiting)   . Rotator cuff tear, right   . Vitamin D deficiency   . Wears glasses     Past Surgical History:  Procedure Laterality Date  . BUNIONECTOMY Right 2004   BOTH SIDES OF FOOT  . CRYOTHERAPY    . HARDWARE REMOVAL Right 04/15/2014   Procedure: EXCISION OF RIGHT FOOT DORSAL HARDWARE;  Surgeon: Wylene Simmer, MD;  Location: [place];  Service: Orthopedics;  Laterality: Right;  . RIGHT SHOULDER SURGERY  X3  LAST ONE 2010   INCLUDING ROTATOR CUFF REPAIR /  RECONSTRUCTION  . SHOULDER ARTHROSCOPY WITH ROTATOR CUFF REPAIR Left 03/18/2014   Procedure: LEFT SHOULDER ARTHROSCOPY WITH EVALUATION UNDER ANESTHESIA DEBRIDEMENT SUBACROMIAL DECOMPRESSION DISTAL CLAVICAL RESECTION ROTATOR CUFF REPAIR BICEPS  TENDINOSIS;  Surgeon: Sydnee Cabal, MD;  Location: [place];  Service: Orthopedics;  Laterality: Left;  . SHOULDER ARTHROSCOPY WITH ROTATOR CUFF REPAIR Right 06/23/2019   Procedure: SHOULDER ARTHROSCOPY WITH ROTATOR CUFF REPAIR , biceps tenotomy;  Surgeon: Sydnee Cabal, MD;  Location: [facility];  Service: Orthopedics;  Laterality: Right;  . TONSILLECTOMY   AGE 64    There were no vitals filed for this visit.  Subjective Assessment - 03/21/20 0810    Subjective  Pt reports shoulder is a little stiff    Currently in Pain?  No/denies    Pain Score  0-No pain    Pain Location  Shoulder    Pain Orientation  Left;Right         [facility] PT Assessment - 03/21/20 0001      AROM   Right Shoulder Flexion  180 Degrees   abnormal scapulohumeral rhythm   Right Shoulder ABduction  180 Degrees   abnormal scapulohumeral rhythm   Right Shoulder Internal Rotation  56 Degrees    Right Shoulder External Rotation  90 Degrees      Strength   Right Shoulder Flexion  3+/5    Right Shoulder ABduction  3+/5    Right Shoulder Internal Rotation  4+/5    Right Shoulder External Rotation  4/5                    [facility] Adult PT Treatment/Exercise - 03/21/20 0001      Shoulder Exercises: Standing   External Rotation  AROM;Strengthening;Both;20 reps;Weights    External Rotation Weight (lbs)  5    Internal Rotation  Weights;Strengthening;Right;15 reps    Internal Rotation Weight (lbs)  10    Flexion  Strengthening;Both;20 reps    Shoulder Flexion Weight (lbs)  1    ABduction  Right;20 reps;Weights;Strengthening    Shoulder ABduction Weight (lbs)  1    Extension  Weights;Both;15 reps    Extension Weight (lbs)  20    Other Standing Exercises  RUE shoulder flexion/abduction x10 w/ 3# weight      Shoulder Exercises: ROM/Strengthening   UBE (Upper Arm Bike)  L5 3 min fwd; 3 min bkwd    Other ROM/Strengthening Exercises  Rows 65lb 2x15, Lats 55 2x10 narrow, wide 25lb x20,     Other ROM/Strengthening Exercises  chest press 35lb 2x15               PT Short Term Goals - 07/03/19 1024      PT SHORT TERM GOAL #1   Title  independent with initial HEP    Status  Achieved        PT Long Term Goals - 03/17/20 0844      PT LONG TERM GOAL #5   Title  lift 5# overhead with right arm    Status  Partially Met            Plan -  03/21/20 0826    Clinical Impression Statement  Pt AROM is limited in shoulder IR; full ROM in shoulder flexion/abduction, but compensation present with abnormal scapulohumeral rhythm. Pt demonstrates strength deficits in shoulder flexion/abduction ROM. Functionally, pt is able to lift 3# with difficulty into overhead cabinet but unable to lift 5# without difficulty. Pt would benefit from continued skilled PT to address these deficits.    Examination-Activity Limitations  Lift;Reach Overhead    Examination-Participation Restrictions  Cleaning;Community Activity    Stability/Clinical Decision Making  Evolving/Moderate complexity    Rehab Potential  Good    PT Frequency  2x / week    PT Duration  8 weeks    PT Treatment/Interventions  ADLs/Self Care Home Management;Cryotherapy;Electrical Stimulation;Therapeutic activities;Therapeutic exercise;Patient/family education;Manual techniques;Vasopneumatic Device    PT Next Visit Plan  continue to progress scapulohumeral rhythm and strength, easing into OH strengthening    Consulted and Agree with Plan of Care  Patient       Patient will benefit from skilled therapeutic intervention in order to improve the following deficits and impairments:  Pain, Postural dysfunction, Increased muscle spasms, Decreased scar mobility, Decreased activity tolerance, Decreased range of motion, Decreased strength, Impaired UE functional use, Impaired flexibility, Increased edema  Visit Diagnosis: Stiffness of right shoulder, not elsewhere classified  Acute pain of right shoulder  Localized edema     Problem List Patient Active Problem List   Diagnosis Date Noted  . S/P rotator cuff repair 03/18/2014   Amador Cunas, PT, DPT [name] 03/21/2020, 8:42 AM  [address] [place], [place], [address] Phone: [phone]   Fax:  [phone]  Name: Stacey Kelly MRN: [id number] Date of  Birth: [birth year]

## 2020-03-24 ENCOUNTER — Encounter: Payer: Self-pay | Admitting: Physical Therapy

## 2020-03-24 ENCOUNTER — Ambulatory Visit: Payer: PRIVATE HEALTH INSURANCE | Admitting: Physical Therapy

## 2020-03-24 ENCOUNTER — Other Ambulatory Visit: Payer: Self-pay

## 2020-03-24 DIAGNOSIS — H5203 Hypermetropia, bilateral: Secondary | ICD-10-CM | POA: Diagnosis not present

## 2020-03-24 DIAGNOSIS — M25611 Stiffness of right shoulder, not elsewhere classified: Secondary | ICD-10-CM | POA: Diagnosis not present

## 2020-03-24 DIAGNOSIS — H524 Presbyopia: Secondary | ICD-10-CM | POA: Diagnosis not present

## 2020-03-24 NOTE — Therapy (Signed)
[place], [place], [address] Phone: [phone]   Fax:  [phone]  Physical Therapy Treatment  Patient Details  Name: Stacey Kelly MRN: [id number] Date of Birth: 05-05-1956 Referring Provider (PT): RA Theda Sers   Encounter Date: 03/24/2020  PT End of Session - 03/24/20 0928    Visit Number  67    Authorization Type  W/C    PT Start Time  0846    PT Stop Time  0928    PT Time Calculation (min)  42 min    Activity Tolerance  Patient tolerated treatment well    Behavior During Therapy  [facility] for tasks assessed/performed       Past Medical History:  Diagnosis Date  . Depression   . Heart murmur    ASYMPTOMATIC--  ECHO  1995  MILD REGURG  . Migraine   . OA (osteoarthritis)    shoulders  . PONV (postoperative nausea and vomiting)   . Rotator cuff tear, right   . Vitamin D deficiency   . Wears glasses     Past Surgical History:  Procedure Laterality Date  . BUNIONECTOMY Right 2004   BOTH SIDES OF FOOT  . CRYOTHERAPY    . HARDWARE REMOVAL Right 04/15/2014   Procedure: EXCISION OF RIGHT FOOT DORSAL HARDWARE;  Surgeon: Wylene Simmer, MD;  Location: [place];  Service: Orthopedics;  Laterality: Right;  . RIGHT SHOULDER SURGERY  X3  LAST ONE 2010   INCLUDING ROTATOR CUFF REPAIR /  RECONSTRUCTION  . SHOULDER ARTHROSCOPY WITH ROTATOR CUFF REPAIR Left 03/18/2014   Procedure: LEFT SHOULDER ARTHROSCOPY WITH EVALUATION UNDER ANESTHESIA DEBRIDEMENT SUBACROMIAL DECOMPRESSION DISTAL CLAVICAL RESECTION ROTATOR CUFF REPAIR BICEPS  TENDINOSIS;  Surgeon: Sydnee Cabal, MD;  Location: [place];  Service: Orthopedics;  Laterality: Left;  . SHOULDER ARTHROSCOPY WITH ROTATOR CUFF REPAIR Right 06/23/2019   Procedure: SHOULDER ARTHROSCOPY WITH ROTATOR CUFF REPAIR , biceps tenotomy;  Surgeon: Sydnee Cabal, MD;  Location: [facility];  Service: Orthopedics;   Laterality: Right;  . TONSILLECTOMY  AGE 28    There were no vitals filed for this visit.  Subjective Assessment - 03/24/20 0854    Subjective  "Good just stiff"    Currently in Pain?  No/denies                        [facility] Adult PT Treatment/Exercise - 03/24/20 0001      Shoulder Exercises: Supine   Other Supine Exercises  ER/IR green ball x10      Shoulder Exercises: Seated   Other Seated Exercises  OHP 2lb 2x10       Shoulder Exercises: Sidelying   Other Sidelying Exercises  Drop and catch ER green ball 2x10       Shoulder Exercises: Standing   Extension  Weights;Both;20 reps    Extension Weight (lbs)  20    Other Standing Exercises  RUE from counter top to 3 cabinet level holding 2lb x10 4lb 2x5, Abd 2lb 2x5 RUE abd ;90 ER ball toss green ball 2x5, Drop and catch green ball abd & flex x 10 each    Other Standing Exercises  5lb cable rear flys 2x10      Shoulder Exercises: ROM/Strengthening   UBE (Upper Arm Bike)  constant work 25 watts 3 min each       Manual Therapy   Manual therapy comments  PROM taken to  end range and held, Tight with internal rotation tactile cues needed to keep shoulder down    Passive ROM  IR/ER/Flexion               PT Short Term Goals - 07/03/19 1024      PT SHORT TERM GOAL #1   Title  independent with initial HEP    Status  Achieved        PT Long Term Goals - 03/17/20 0844      PT LONG TERM GOAL #5   Title  lift 5# overhead with right arm    Status  Partially Met            Plan - 03/24/20 0928    Clinical Impression Statement  Pt continues to be weak with flexion and abduction above 90 degrees. She struggles with OHP as well. RUE fatigues quick with activity. She was able to lift 4lb today overhead. Passive IR is tight needing tactile cues to keep shoulder from protraction    Examination-Activity Limitations  Lift;Reach Overhead    Examination-Participation Restrictions  Cleaning;Community Activity     Stability/Clinical Decision Making  Evolving/Moderate complexity    Rehab Potential  Good    PT Frequency  2x / week    PT Duration  8 weeks    PT Next Visit Plan  continue to progress scapulohumeral rhythm and strength, easing into OH strengthening       Patient will benefit from skilled therapeutic intervention in order to improve the following deficits and impairments:  Pain, Postural dysfunction, Increased muscle spasms, Decreased scar mobility, Decreased activity tolerance, Decreased range of motion, Decreased strength, Impaired UE functional use, Impaired flexibility, Increased edema  Visit Diagnosis: Acute pain of right shoulder  Stiffness of right shoulder, not elsewhere classified  Localized edema     Problem List Patient Active Problem List   Diagnosis Date Noted  . S/P rotator cuff repair 03/18/2014    Scot Jun, PTA 03/24/2020, 9:31 AM  [place], [place], [address] Phone: [phone]   Fax:  [phone]  Name: Stacey Kelly MRN: [id number] Date of Birth: [birth year]

## 2020-03-24 NOTE — Therapy (Deleted)
[place], [place], [address] Phone: [phone]   Fax:  [phone]  Physical Therapy Evaluation  Patient Details  Name: Stacey Kelly MRN: [id number] Date of Birth: 11-Jan-1956 Referring Provider (PT): RA Theda Sers   Encounter Date: 03/24/2020    Past Medical History:  Diagnosis Date  . Depression   . Heart murmur    ASYMPTOMATIC--  ECHO  1995  MILD REGURG  . Migraine   . OA (osteoarthritis)    shoulders  . PONV (postoperative nausea and vomiting)   . Rotator cuff tear, right   . Vitamin D deficiency   . Wears glasses     Past Surgical History:  Procedure Laterality Date  . BUNIONECTOMY Right 2004   BOTH SIDES OF FOOT  . CRYOTHERAPY    . HARDWARE REMOVAL Right 04/15/2014   Procedure: EXCISION OF RIGHT FOOT DORSAL HARDWARE;  Surgeon: Wylene Simmer, MD;  Location: [place];  Service: Orthopedics;  Laterality: Right;  . RIGHT SHOULDER SURGERY  X3  LAST ONE 2010   INCLUDING ROTATOR CUFF REPAIR /  RECONSTRUCTION  . SHOULDER ARTHROSCOPY WITH ROTATOR CUFF REPAIR Left 03/18/2014   Procedure: LEFT SHOULDER ARTHROSCOPY WITH EVALUATION UNDER ANESTHESIA DEBRIDEMENT SUBACROMIAL DECOMPRESSION DISTAL CLAVICAL RESECTION ROTATOR CUFF REPAIR BICEPS  TENDINOSIS;  Surgeon: Sydnee Cabal, MD;  Location: [place];  Service: Orthopedics;  Laterality: Left;  . SHOULDER ARTHROSCOPY WITH ROTATOR CUFF REPAIR Right 06/23/2019   Procedure: SHOULDER ARTHROSCOPY WITH ROTATOR CUFF REPAIR , biceps tenotomy;  Surgeon: Sydnee Cabal, MD;  Location: [facility];  Service: Orthopedics;  Laterality: Right;  . TONSILLECTOMY  AGE 88    There were no vitals filed for this visit.                    Objective measurements completed on examination: See above findings.                PT Short Term Goals - 07/03/19 1024      PT SHORT TERM GOAL #1   Title  independent with initial HEP    Status  Achieved        PT Long Term Goals - 03/17/20 0844      PT LONG TERM GOAL #5   Title  lift 5# overhead with right arm    Status  Partially Met               Patient will benefit from skilled therapeutic intervention in order to improve the following deficits and impairments:     Visit Diagnosis: No diagnosis found.     Problem List Patient Active Problem List   Diagnosis Date Noted  . S/P rotator cuff repair 03/18/2014    Scot Jun 03/24/2020, 10:01 AM  [address] [address] [place], [place], [address] Phone: [phone]   Fax:  [phone]  Name: Stacey Kelly MRN: [id number] Date of Birth: 1956/04/08

## 2020-03-29 ENCOUNTER — Ambulatory Visit: Payer: 59 | Admitting: Physical Therapy

## 2020-03-29 ENCOUNTER — Encounter: Payer: Self-pay | Admitting: Physical Therapy

## 2020-03-29 ENCOUNTER — Other Ambulatory Visit: Payer: Self-pay

## 2020-03-29 ENCOUNTER — Ambulatory Visit: Payer: PRIVATE HEALTH INSURANCE | Attending: Specialist | Admitting: Physical Therapy

## 2020-03-29 DIAGNOSIS — M25511 Pain in right shoulder: Secondary | ICD-10-CM | POA: Diagnosis present

## 2020-03-29 DIAGNOSIS — R600 Localized edema: Secondary | ICD-10-CM | POA: Diagnosis present

## 2020-03-29 DIAGNOSIS — M25611 Stiffness of right shoulder, not elsewhere classified: Secondary | ICD-10-CM | POA: Insufficient documentation

## 2020-03-29 NOTE — Therapy (Signed)
[place], [place], [address] Phone: [phone]   Fax:  [phone]  Physical Therapy Treatment  Patient Details  Name: Stacey Kelly MRN: [id number] Date of Birth: 03/30/1956 Referring Provider (PT): RA Theda Sers   Encounter Date: 03/29/2020  PT End of Session - 03/29/20 0925    Visit Number  20    Authorization Type  W/C    PT Start Time  0851    PT Stop Time  0930    PT Time Calculation (min)  39 min    Activity Tolerance  Patient tolerated treatment well    Behavior During Therapy  [facility] for tasks assessed/performed       Past Medical History:  Diagnosis Date  . Depression   . Heart murmur    ASYMPTOMATIC--  ECHO  1995  MILD REGURG  . Migraine   . OA (osteoarthritis)    shoulders  . PONV (postoperative nausea and vomiting)   . Rotator cuff tear, right   . Vitamin D deficiency   . Wears glasses     Past Surgical History:  Procedure Laterality Date  . BUNIONECTOMY Right 2004   BOTH SIDES OF FOOT  . CRYOTHERAPY    . HARDWARE REMOVAL Right 04/15/2014   Procedure: EXCISION OF RIGHT FOOT DORSAL HARDWARE;  Surgeon: Wylene Simmer, MD;  Location: [place];  Service: Orthopedics;  Laterality: Right;  . RIGHT SHOULDER SURGERY  X3  LAST ONE 2010   INCLUDING ROTATOR CUFF REPAIR /  RECONSTRUCTION  . SHOULDER ARTHROSCOPY WITH ROTATOR CUFF REPAIR Left 03/18/2014   Procedure: LEFT SHOULDER ARTHROSCOPY WITH EVALUATION UNDER ANESTHESIA DEBRIDEMENT SUBACROMIAL DECOMPRESSION DISTAL CLAVICAL RESECTION ROTATOR CUFF REPAIR BICEPS  TENDINOSIS;  Surgeon: Sydnee Cabal, MD;  Location: [place];  Service: Orthopedics;  Laterality: Left;  . SHOULDER ARTHROSCOPY WITH ROTATOR CUFF REPAIR Right 06/23/2019   Procedure: SHOULDER ARTHROSCOPY WITH ROTATOR CUFF REPAIR , biceps tenotomy;  Surgeon: Sydnee Cabal, MD;  Location: [facility];  Service: Orthopedics;  Laterality:  Right;  . TONSILLECTOMY  AGE 11    There were no vitals filed for this visit.  Subjective Assessment - 03/29/20 0853    Subjective  "Feeling really good"    Currently in Pain?  No/denies                        [facility] Adult PT Treatment/Exercise - 03/29/20 0001      Shoulder Exercises: Prone   Other Prone Exercises  3pt row 10l 2x10, then rear delt 4lb 2x15       Shoulder Exercises: Standing   Horizontal ABduction  Theraband;20 reps;Left    Theraband Level (Shoulder Horizontal ABduction)  Level 3 (Green)    External Rotation  AROM;Strengthening;Both;20 reps;Weights    External Rotation Weight (lbs)  5    Internal Rotation  Weights;Strengthening;Right;15 reps    Internal Rotation Weight (lbs)  10    Flexion  Strengthening;Both;10 reps    Shoulder Flexion Weight (lbs)  3    ABduction  20 reps;Strengthening;5 reps;Both;Weights   x2   Shoulder ABduction Weight (lbs)  3    Extension  Weights;Both;15 reps   x2   Extension Weight (lbs)  20    Other Standing Exercises  RUE from counter top to 3 cabinet level holding 5lb x3, 4lb 2x5;  Abd 2nd level 4lb 2x5; Tricept Ext 55lb 3x10; Biceps curls 7lb 2x15  Other Standing Exercises  OHP 3lb 3x5      Shoulder Exercises: ROM/Strengthening   UBE (Upper Arm Bike)  constant work 25 watts 2 min each       Manual Therapy   Manual therapy comments  PROM taken to end range and held, Tight with internal rotation tactile cues needed to keep shoulder down    Passive ROM  IR/ER/Flexion               PT Short Term Goals - 07/03/19 1024      PT SHORT TERM GOAL #1   Title  independent with initial HEP    Status  Achieved        PT Long Term Goals - 03/17/20 0844      PT LONG TERM GOAL #5   Title  lift 5# overhead with right arm    Status  Partially Met            Plan - 03/29/20 0925    Clinical Impression Statement  Pt did progress today placing a 5lb weight on a overhead shelf for the first time today.  She was able to do this three times before becoming to fatigued. She was able to performed over head presses today with 3lb weights. Weakness remains with an outreaches RUE above 90 degrees.    Examination-Activity Limitations  Lift;Reach Overhead    Examination-Participation Restrictions  Cleaning;Community Activity    Stability/Clinical Decision Making  Evolving/Moderate complexity    Rehab Potential  Good    PT Frequency  2x / week    PT Duration  8 weeks    PT Treatment/Interventions  ADLs/Self Care Home Management;Cryotherapy;Electrical Stimulation;Therapeutic activities;Therapeutic exercise;Patient/family education;Manual techniques;Vasopneumatic Device    PT Next Visit Plan  continue to progress scapulohumeral rhythm and strength, easing into OH strengthening       Patient will benefit from skilled therapeutic intervention in order to improve the following deficits and impairments:  Pain, Postural dysfunction, Increased muscle spasms, Decreased scar mobility, Decreased activity tolerance, Decreased range of motion, Decreased strength, Impaired UE functional use, Impaired flexibility, Increased edema  Visit Diagnosis: Stiffness of right shoulder, not elsewhere classified  Localized edema  Acute pain of right shoulder     Problem List Patient Active Problem List   Diagnosis Date Noted  . S/P rotator cuff repair 03/18/2014    Scot Jun, PTA 03/29/2020, 9:30 AM  [place], [place], [address] Phone: [phone]   Fax:  [phone]  Name: Stacey Kelly MRN: [id number] Date of Birth: 1955-12-17

## 2020-04-01 ENCOUNTER — Ambulatory Visit: Payer: PRIVATE HEALTH INSURANCE | Admitting: Physical Therapy

## 2020-04-01 ENCOUNTER — Encounter: Payer: Self-pay | Admitting: Physical Therapy

## 2020-04-01 ENCOUNTER — Ambulatory Visit: Payer: 59 | Admitting: Physical Therapy

## 2020-04-01 ENCOUNTER — Other Ambulatory Visit: Payer: Self-pay

## 2020-04-01 DIAGNOSIS — M25511 Pain in right shoulder: Secondary | ICD-10-CM

## 2020-04-01 DIAGNOSIS — M25611 Stiffness of right shoulder, not elsewhere classified: Secondary | ICD-10-CM | POA: Diagnosis not present

## 2020-04-01 DIAGNOSIS — R600 Localized edema: Secondary | ICD-10-CM

## 2020-04-01 NOTE — Therapy (Signed)
[place], [place], [address] Phone: [phone]   Fax:  [phone]  Physical Therapy Treatment  Patient Details  Name: Stacey Kelly MRN: [id number] Date of Birth: February 23, 1956 Referring Provider (PT): RA Theda Sers   Encounter Date: 04/01/2020  PT End of Session - 04/01/20 0924    Visit Number  86    PT Start Time  8329    PT Stop Time  0930    PT Time Calculation (min)  43 min    Activity Tolerance  Patient tolerated treatment well    Behavior During Therapy  [facility] for tasks assessed/performed       Past Medical History:  Diagnosis Date  . Depression   . Heart murmur    ASYMPTOMATIC--  ECHO  1995  MILD REGURG  . Migraine   . OA (osteoarthritis)    shoulders  . PONV (postoperative nausea and vomiting)   . Rotator cuff tear, right   . Vitamin D deficiency   . Wears glasses     Past Surgical History:  Procedure Laterality Date  . BUNIONECTOMY Right 2004   BOTH SIDES OF FOOT  . CRYOTHERAPY    . HARDWARE REMOVAL Right 04/15/2014   Procedure: EXCISION OF RIGHT FOOT DORSAL HARDWARE;  Surgeon: Wylene Simmer, MD;  Location: [place];  Service: Orthopedics;  Laterality: Right;  . RIGHT SHOULDER SURGERY  X3  LAST ONE 2010   INCLUDING ROTATOR CUFF REPAIR /  RECONSTRUCTION  . SHOULDER ARTHROSCOPY WITH ROTATOR CUFF REPAIR Left 03/18/2014   Procedure: LEFT SHOULDER ARTHROSCOPY WITH EVALUATION UNDER ANESTHESIA DEBRIDEMENT SUBACROMIAL DECOMPRESSION DISTAL CLAVICAL RESECTION ROTATOR CUFF REPAIR BICEPS  TENDINOSIS;  Surgeon: Sydnee Cabal, MD;  Location: [place];  Service: Orthopedics;  Laterality: Left;  . SHOULDER ARTHROSCOPY WITH ROTATOR CUFF REPAIR Right 06/23/2019   Procedure: SHOULDER ARTHROSCOPY WITH ROTATOR CUFF REPAIR , biceps tenotomy;  Surgeon: Sydnee Cabal, MD;  Location: [facility];  Service: Orthopedics;  Laterality: Right;  . TONSILLECTOMY   AGE 64    There were no vitals filed for this visit.  Subjective Assessment - 04/01/20 0856    Subjective  "Great"    Currently in Pain?  No/denies                        [facility] Adult PT Treatment/Exercise - 04/01/20 0001      Shoulder Exercises: Prone   Other Prone Exercises  3pt row 10lb 2x15, then rear delt 5lb 2x15       Shoulder Exercises: Standing   Horizontal ABduction  Theraband;20 reps;Left    Theraband Level (Shoulder Horizontal ABduction)  Level 3 (Green)    External Rotation  AROM;Strengthening;Both;20 reps;Theraband   ABD 90   Theraband Level (Shoulder External Rotation)  Level 1 (Yellow)    Internal Rotation  Weights;Strengthening;Right;15 reps    Internal Rotation Weight (lbs)  10    Flexion  Strengthening;Both;10 reps    Shoulder Flexion Weight (lbs)  3    ABduction  20 reps;Strengthening;Both;Weights    Shoulder ABduction Weight (lbs)  3    Extension  Strengthening;Weights;Both    Extension Weight (lbs)  25    Other Standing Exercises  Tricept Ext 55lb 2x15; Biceps curls 25lb 2x15; Drop and catch green ball x 10 for both abd & flex    Other Standing Exercises  OHP 3lb 2x10      Shoulder Exercises: ROM/Strengthening  UBE (Upper Arm Bike)  constant work 25 watts 2 min each       Manual Therapy   Manual therapy comments  PROM taken to end range and held, Tight with internal rotation tactile cues needed to keep shoulder down    Passive ROM  IR/ER/Flexion               PT Short Term Goals - 07/03/19 1024      PT SHORT TERM GOAL #1   Title  independent with initial HEP    Status  Achieved        PT Long Term Goals - 04/01/20 0925      PT LONG TERM GOAL #1   Title  decrease pain 50%    Status  Achieved      PT LONG TERM GOAL #2   Title  AROM of the right shoulder to WFL's    Status  Achieved      PT LONG TERM GOAL #3   Title  report no difficulty with dressing and doing hair    Status  Achieved      PT LONG TERM GOAL  #4   Title  sleep without pain down the arm    Status  Achieved      PT LONG TERM GOAL #5   Title  lift 5# overhead with right arm    Status  Partially Met            Plan - 04/01/20 0925    Clinical Impression Statement  Pt able to progress with OHP completing two sets of 10 repetitions. Second set was very taxing after 6 reps. No reports of pain in today's session. Some assist needed with external rotation while RUE abducted to 90. Good strength with triceps extension.    Examination-Activity Limitations  Lift;Reach Overhead    Examination-Participation Restrictions  Cleaning;Community Activity    Rehab Potential  Good    PT Frequency  2x / week    PT Duration  8 weeks    PT Treatment/Interventions  ADLs/Self Care Home Management;Cryotherapy;Electrical Stimulation;Therapeutic activities;Therapeutic exercise;Patient/family education;Manual techniques;Vasopneumatic Device    PT Next Visit Plan  continue to progress scapulohumeral rhythm and strength, easing into OH strengthening       Patient will benefit from skilled therapeutic intervention in order to improve the following deficits and impairments:  Pain, Postural dysfunction, Increased muscle spasms, Decreased scar mobility, Decreased activity tolerance, Decreased range of motion, Decreased strength, Impaired UE functional use, Impaired flexibility, Increased edema  Visit Diagnosis: Stiffness of right shoulder, not elsewhere classified  Acute pain of right shoulder  Localized edema     Problem List Patient Active Problem List   Diagnosis Date Noted  . S/P rotator cuff repair 03/18/2014    Scot Jun, PTA 04/01/2020, 9:28 AM  [place], [place], [address] Phone: [phone]   Fax:  [phone]  Name: RAELEIGH GUINN MRN: [id number] Date of Birth: [birth year]

## 2020-04-04 ENCOUNTER — Encounter: Payer: Self-pay | Admitting: Physical Therapy

## 2020-04-04 ENCOUNTER — Ambulatory Visit: Payer: 59 | Admitting: Physical Therapy

## 2020-04-04 ENCOUNTER — Other Ambulatory Visit: Payer: Self-pay

## 2020-04-04 ENCOUNTER — Ambulatory Visit: Payer: PRIVATE HEALTH INSURANCE | Admitting: Physical Therapy

## 2020-04-04 DIAGNOSIS — M25611 Stiffness of right shoulder, not elsewhere classified: Secondary | ICD-10-CM

## 2020-04-04 DIAGNOSIS — R600 Localized edema: Secondary | ICD-10-CM

## 2020-04-04 DIAGNOSIS — M25511 Pain in right shoulder: Secondary | ICD-10-CM

## 2020-04-04 NOTE — Therapy (Signed)
Stacey Kelly, [place], [address] Phone: [phone]   Fax:  [phone]  Physical Therapy Treatment  Patient Details  Name: Stacey Kelly MRN: [id number] Date of Birth: 1955-11-06 Referring Provider (PT): RA Theda Sers   Encounter Date: 04/04/2020  PT End of Session - 04/04/20 0927    Visit Number  51    PT Start Time  0867    PT Stop Time  0927    PT Time Calculation (min)  36 min    Activity Tolerance  Patient tolerated treatment well    Behavior During Therapy  [facility] for tasks assessed/performed       Past Medical History:  Diagnosis Date  . Depression   . Heart murmur    ASYMPTOMATIC--  ECHO  1995  MILD REGURG  . Migraine   . OA (osteoarthritis)    shoulders  . PONV (postoperative nausea and vomiting)   . Rotator cuff tear, right   . Vitamin D deficiency   . Wears glasses     Past Surgical History:  Procedure Laterality Date  . BUNIONECTOMY Right 2004   BOTH SIDES OF FOOT  . CRYOTHERAPY    . HARDWARE REMOVAL Right 04/15/2014   Procedure: EXCISION OF RIGHT FOOT DORSAL HARDWARE;  Surgeon: Wylene Simmer, MD;  Location: [place];  Service: Orthopedics;  Laterality: Right;  . RIGHT SHOULDER SURGERY  X3  LAST ONE 2010   INCLUDING ROTATOR CUFF REPAIR /  RECONSTRUCTION  . SHOULDER ARTHROSCOPY WITH ROTATOR CUFF REPAIR Left 03/18/2014   Procedure: LEFT SHOULDER ARTHROSCOPY WITH EVALUATION UNDER ANESTHESIA DEBRIDEMENT SUBACROMIAL DECOMPRESSION DISTAL CLAVICAL RESECTION ROTATOR CUFF REPAIR BICEPS  TENDINOSIS;  Surgeon: Sydnee Cabal, MD;  Location: [place];  Service: Orthopedics;  Laterality: Left;  . SHOULDER ARTHROSCOPY WITH ROTATOR CUFF REPAIR Right 06/23/2019   Procedure: SHOULDER ARTHROSCOPY WITH ROTATOR CUFF REPAIR , biceps tenotomy;  Surgeon: Sydnee Cabal, MD;  Location: [facility];  Service: Orthopedics;  Laterality: Right;  . TONSILLECTOMY   AGE 64    There were no vitals filed for this visit.  Subjective Assessment - 04/04/20 0852    Subjective  "Good"    Currently in Pain?  No/denies    Pain Location  Shoulder    Pain Orientation  Right    Pain Descriptors / Indicators  Tightness                        [facility] Adult PT Treatment/Exercise - 04/04/20 0001      Shoulder Exercises: Standing   External Rotation  AROM;Strengthening;Both;20 reps;Theraband   abd 90   Theraband Level (Shoulder External Rotation)  Level 1 (Yellow)    Internal Rotation  Weights;Strengthening;Right;15 reps    Internal Rotation Weight (lbs)  10    Flexion  Strengthening;Both;10 reps    Shoulder Flexion Weight (lbs)  3    ABduction  20 reps;Strengthening;Both;Weights    Shoulder ABduction Weight (lbs)  3    Extension  Strengthening;Weights;Both    Extension Weight (lbs)  25    Row  Right;20 reps;Weights    Row Weight (lbs)  25    Other Standing Exercises  RUE from counter top to 3 cabinet level holding 5lb 3x5 Abd 3lb 3x5; 90 ER ball toss green ball 2x5    Other Standing Exercises  OHP 3lb 3x5      Shoulder Exercises: ROM/Strengthening   UBE (Upper Arm Bike)  constant work 25 watts 2 min each     Nustep  UE only L1 x 3 min       Manual Therapy   Manual therapy comments  PROM taken to end range and held, Tight with internal rotation tactile cues needed to keep shoulder down               PT Short Term Goals - 07/03/19 1024      PT SHORT TERM GOAL #1   Title  independent with initial HEP    Status  Achieved        PT Long Term Goals - 04/01/20 0925      PT LONG TERM GOAL #1   Title  decrease pain 50%    Status  Achieved      PT LONG TERM GOAL #2   Title  AROM of the right shoulder to WFL's    Status  Achieved      PT LONG TERM GOAL #3   Title  report no difficulty with dressing and doing hair    Status  Achieved      PT LONG TERM GOAL #4   Title  sleep without pain down the arm    Status   Achieved      PT LONG TERM GOAL #5   Title  lift 5# overhead with right arm    Status  Partially Met            Plan - 04/04/20 0927    Clinical Impression Statement  6 minutes lat for today's session. Good effort given throughout the session. able to lift 5lb over head today's with minium compensation. Assist needed with external rotation abducted to 90. Very fatigue with OHP today.    Examination-Activity Limitations  Lift;Reach Overhead    Examination-Participation Restrictions  Cleaning;Community Activity    Stability/Clinical Decision Making  Evolving/Moderate complexity    Rehab Potential  Good    PT Frequency  2x / week    PT Duration  8 weeks    PT Treatment/Interventions  ADLs/Self Care Home Management;Cryotherapy;Electrical Stimulation;Therapeutic activities;Therapeutic exercise;Patient/family education;Manual techniques;Vasopneumatic Device    PT Next Visit Plan  continue to progress scapulohumeral rhythm and strength, easing into OH strengthening       Patient will benefit from skilled therapeutic intervention in order to improve the following deficits and impairments:  Pain, Postural dysfunction, Increased muscle spasms, Decreased scar mobility, Decreased activity tolerance, Decreased range of motion, Decreased strength, Impaired UE functional use, Impaired flexibility, Increased edema  Visit Diagnosis: Acute pain of right shoulder  Localized edema  Stiffness of right shoulder, not elsewhere classified     Problem List Patient Active Problem List   Diagnosis Date Noted  . S/P rotator cuff repair 03/18/2014    Scot Jun, PTA 04/04/2020, 9:29 AM  [facility] [address] [place], [place], [address] Phone: [phone]   Fax:  [phone]  Name: Stacey Kelly MRN: [id number] Date of Birth: [birth year]

## 2020-04-07 ENCOUNTER — Other Ambulatory Visit: Payer: Self-pay

## 2020-04-07 ENCOUNTER — Ambulatory Visit: Payer: 59 | Admitting: Physical Therapy

## 2020-04-07 ENCOUNTER — Encounter: Payer: Self-pay | Admitting: Physical Therapy

## 2020-04-07 ENCOUNTER — Ambulatory Visit: Payer: PRIVATE HEALTH INSURANCE | Admitting: Physical Therapy

## 2020-04-07 DIAGNOSIS — M25611 Stiffness of right shoulder, not elsewhere classified: Secondary | ICD-10-CM

## 2020-04-07 DIAGNOSIS — R600 Localized edema: Secondary | ICD-10-CM

## 2020-04-07 DIAGNOSIS — M25511 Pain in right shoulder: Secondary | ICD-10-CM

## 2020-04-07 NOTE — Therapy (Signed)
[place], [place], [address] Phone: [phone]   Fax:  [phone]  Physical Therapy Treatment  Patient Details  Name: Stacey Kelly MRN: [id number] Date of Birth: [birth year] Referring Provider (PT): RA Theda Sers   Encounter Date: 04/07/2020   PT End of Session - 04/07/20 0925    Visit Number 58    Authorization Type W/C    PT Start Time 0854    PT Stop Time 0930    PT Time Calculation (min) 36 min    Activity Tolerance Patient tolerated treatment well    Behavior During Therapy [facility] for tasks assessed/performed           Past Medical History:  Diagnosis Date  . Depression   . Heart murmur    ASYMPTOMATIC--  ECHO  1995  MILD REGURG  . Migraine   . OA (osteoarthritis)    shoulders  . PONV (postoperative nausea and vomiting)   . Rotator cuff tear, right   . Vitamin D deficiency   . Wears glasses     Past Surgical History:  Procedure Laterality Date  . BUNIONECTOMY Right 2004   BOTH SIDES OF FOOT  . CRYOTHERAPY    . HARDWARE REMOVAL Right 04/15/2014   Procedure: EXCISION OF RIGHT FOOT DORSAL HARDWARE;  Surgeon: Wylene Simmer, MD;  Location: [place];  Service: Orthopedics;  Laterality: Right;  . RIGHT SHOULDER SURGERY  X3  LAST ONE 2010   INCLUDING ROTATOR CUFF REPAIR /  RECONSTRUCTION  . SHOULDER ARTHROSCOPY WITH ROTATOR CUFF REPAIR Left 03/18/2014   Procedure: LEFT SHOULDER ARTHROSCOPY WITH EVALUATION UNDER ANESTHESIA DEBRIDEMENT SUBACROMIAL DECOMPRESSION DISTAL CLAVICAL RESECTION ROTATOR CUFF REPAIR BICEPS  TENDINOSIS;  Surgeon: Sydnee Cabal, MD;  Location: [place];  Service: Orthopedics;  Laterality: Left;  . SHOULDER ARTHROSCOPY WITH ROTATOR CUFF REPAIR Right 06/23/2019   Procedure: SHOULDER ARTHROSCOPY WITH ROTATOR CUFF REPAIR , biceps tenotomy;  Surgeon: Sydnee Cabal, MD;  Location: [facility];  Service: Orthopedics;   Laterality: Right;  . TONSILLECTOMY  AGE 64    There were no vitals filed for this visit.   Subjective Assessment - 04/07/20 0855    Subjective "Good just stiff"    Currently in Pain? No/denies                             [facility] Adult PT Treatment/Exercise - 04/07/20 0001      Shoulder Exercises: Standing   Horizontal ABduction Theraband;20 reps;Left    Theraband Level (Shoulder Horizontal ABduction) Level 3 (Green)    Extension Weight (lbs) 25    Row Right;Weights;15 reps   x2   Row Weight (lbs) 25    Other Standing Exercises RUE from counter top to 3 cabinet level holding 5lb 3x5 Abd 3lb 3x5; 90 ER ball toss green ball 2x5    Other Standing Exercises OHP 3lb 3x5      Shoulder Exercises: ROM/Strengthening   UBE (Upper Arm Bike) constant work 25 watts 2 min each     Other ROM/Strengthening Exercises Triceps Ext 55 2x12, Biceps curls 25 3x10     Other ROM/Strengthening Exercises chest press 35lb 2x15      Manual Therapy   Manual therapy comments PROM taken to end range and held, Tight with internal rotation tactile cues needed to keep shoulder down    Passive ROM IR/ER/Flexion  PT Short Term Goals - 07/03/19 1024      PT SHORT TERM GOAL #1   Title independent with initial HEP    Status Achieved             PT Long Term Goals - 04/01/20 0925      PT LONG TERM GOAL #1   Title decrease pain 50%    Status Achieved      PT LONG TERM GOAL #2   Title AROM of the right shoulder to WFL's    Status Achieved      PT LONG TERM GOAL #3   Title report no difficulty with dressing and doing hair    Status Achieved      PT LONG TERM GOAL #4   Title sleep without pain down the arm    Status Achieved      PT LONG TERM GOAL #5   Title lift 5# overhead with right arm    Status Partially Met                 Plan - 04/07/20 0926    Clinical Impression Statement 9 minutes late for today's session. increase RUE fatigue  today unable to complete all sets of resisted abduction today as she did previous session. No reports of pain reported. Postural cues required with standing shoulder extensions. Pulling interventions remains strong she remains weak with movements above 90    Examination-Activity Limitations Lift;Reach Overhead    Examination-Participation Restrictions Cleaning;Community Activity    Stability/Clinical Decision Making Evolving/Moderate complexity    Rehab Potential Good    PT Frequency 2x / week    PT Duration 8 weeks    PT Treatment/Interventions ADLs/Self Care Home Management;Cryotherapy;Electrical Stimulation;Therapeutic activities;Therapeutic exercise;Patient/family education;Manual techniques;Vasopneumatic Device    PT Next Visit Plan continue to progress scapulohumeral rhythm and strength, easing into OH strengthening           Patient will benefit from skilled therapeutic intervention in order to improve the following deficits and impairments:  Pain, Postural dysfunction, Increased muscle spasms, Decreased scar mobility, Decreased activity tolerance, Decreased range of motion, Decreased strength, Impaired UE functional use, Impaired flexibility, Increased edema  Visit Diagnosis: Localized edema  Acute pain of right shoulder  Stiffness of right shoulder, not elsewhere classified     Problem List Patient Active Problem List   Diagnosis Date Noted  . S/P rotator cuff repair 03/18/2014    Scot Jun, PTA 04/07/2020, 9:37 AM  [place], [place], [address] Phone: [phone]   Fax:  [phone]  Name: Stacey Kelly MRN: [id number] Date of Birth: [birth year]

## 2020-04-11 ENCOUNTER — Encounter: Payer: Self-pay | Admitting: Physical Therapy

## 2020-04-11 ENCOUNTER — Ambulatory Visit: Payer: PRIVATE HEALTH INSURANCE | Admitting: Physical Therapy

## 2020-04-11 ENCOUNTER — Other Ambulatory Visit: Payer: Self-pay

## 2020-04-11 DIAGNOSIS — R600 Localized edema: Secondary | ICD-10-CM

## 2020-04-11 DIAGNOSIS — M25611 Stiffness of right shoulder, not elsewhere classified: Secondary | ICD-10-CM | POA: Diagnosis not present

## 2020-04-11 DIAGNOSIS — M25511 Pain in right shoulder: Secondary | ICD-10-CM

## 2020-04-11 NOTE — Therapy (Signed)
[place], [place], [address] Phone: [phone]   Fax:  [phone]  Physical Therapy Treatment  Patient Details  Name: Stacey Kelly MRN: [id number] Date of Birth: October 02, 1956 Referring Provider (PT): RA Theda Sers   Encounter Date: 04/11/2020   PT End of Session - 04/11/20 0841    Visit Number 41    PT Start Time 0805    PT Stop Time 0845    PT Time Calculation (min) 40 min    Activity Tolerance Patient tolerated treatment well    Behavior During Therapy [facility] for tasks assessed/performed           Past Medical History:  Diagnosis Date  . Depression   . Heart murmur    ASYMPTOMATIC--  ECHO  1995  MILD REGURG  . Migraine   . OA (osteoarthritis)    shoulders  . PONV (postoperative nausea and vomiting)   . Rotator cuff tear, right   . Vitamin D deficiency   . Wears glasses     Past Surgical History:  Procedure Laterality Date  . BUNIONECTOMY Right 2004   BOTH SIDES OF FOOT  . CRYOTHERAPY    . HARDWARE REMOVAL Right 04/15/2014   Procedure: EXCISION OF RIGHT FOOT DORSAL HARDWARE;  Surgeon: Wylene Simmer, MD;  Location: [place];  Service: Orthopedics;  Laterality: Right;  . RIGHT SHOULDER SURGERY  X3  LAST ONE 2010   INCLUDING ROTATOR CUFF REPAIR /  RECONSTRUCTION  . SHOULDER ARTHROSCOPY WITH ROTATOR CUFF REPAIR Left 03/18/2014   Procedure: LEFT SHOULDER ARTHROSCOPY WITH EVALUATION UNDER ANESTHESIA DEBRIDEMENT SUBACROMIAL DECOMPRESSION DISTAL CLAVICAL RESECTION ROTATOR CUFF REPAIR BICEPS  TENDINOSIS;  Surgeon: Sydnee Cabal, MD;  Location: [place];  Service: Orthopedics;  Laterality: Left;  . SHOULDER ARTHROSCOPY WITH ROTATOR CUFF REPAIR Right 06/23/2019   Procedure: SHOULDER ARTHROSCOPY WITH ROTATOR CUFF REPAIR , biceps tenotomy;  Surgeon: Sydnee Cabal, MD;  Location[facility];  Service: Orthopedics;  Laterality: Right;  . TONSILLECTOMY   AGE 64    There were no vitals filed for this visit.   Subjective Assessment - 04/11/20 0807    Subjective "Great no pain" Did some light gardening Saturday    Currently in Pain? No/denies                             [facility] Adult PT Treatment/Exercise - 04/11/20 0001      Shoulder Exercises: Supine   External Rotation Strengthening;Right;15 reps;Weights    External Rotation Weight (lbs) 3    Internal Rotation Right;Strengthening;15 reps;Weights    Internal Rotation Weight (lbs) 3    Flexion Strengthening;Right;Weights    Shoulder Flexion Weight (lbs) 5, 6    Other Supine Exercises chest press 5lb 2x15       Shoulder Exercises: Standing   Horizontal ABduction Theraband;Left;15 reps   x2   Theraband Level (Shoulder Horizontal ABduction) Level 3 (Green)    External Rotation 20 reps;Right;Strengthening    External Rotation Weight (lbs) 5    Internal Rotation Weights;Strengthening;Right;15 reps   x2   Internal Rotation Weight (lbs) 10    Flexion Strengthening;Both;10 reps;20 reps;Theraband    Theraband Level (Shoulder Flexion) Level 1 (Yellow)    Other Standing Exercises Straight arm pull down 35lb 2x10     Other Standing Exercises D2 flex RUE yellow 2x10       Shoulder Exercises: ROM/Strengthening  Nustep UE only L4 x 5 min     Other ROM/Strengthening Exercises Triceps Ext 55 2x12, Biceps curls 25 3x10     Other ROM/Strengthening Exercises chest press 35lb 2x15, 5lb  rev flys rear dely 2x10       Manual Therapy   Manual therapy comments PROM taken to end range and held, Tight with internal rotation tactile cues needed to keep shoulder down    Passive ROM all direction with end range flexion                    PT Short Term Goals - 07/03/19 1024      PT SHORT TERM GOAL #1   Title independent with initial HEP    Status Achieved             PT Long Term Goals - 04/11/20 0840      PT LONG TERM GOAL #1   Title decrease pain 50%    Status  Achieved      PT LONG TERM GOAL #2   Title AROM of the right shoulder to WFL's    Status Achieved      PT LONG TERM GOAL #3   Title report no difficulty with dressing and doing hair    Status Achieved      PT LONG TERM GOAL #4   Title sleep without pain down the arm    Status Achieved      PT LONG TERM GOAL #5   Title lift 5# overhead with right arm    Status Partially Met                 Plan - 04/11/20 0841    Clinical Impression Statement 5 minutes late, pt did well today with good effort. Open chain interventions above 90 remains very weak. No issues with 6lb supine flexion motion. Tactile cues needed to keep arm in good position with supine ER/IR. Some difficulty today with rear cable fly's.    Examination-Activity Limitations Lift;Reach Overhead    Examination-Participation Restrictions Cleaning;Community Activity    Stability/Clinical Decision Making Evolving/Moderate complexity    Rehab Potential Good    PT Frequency 2x / week    PT Duration 8 weeks    PT Next Visit Plan continue to progress scapulohumeral rhythm and strength, easing into OH strengthening           Patient will benefit from skilled therapeutic intervention in order to improve the following deficits and impairments:  Pain, Postural dysfunction, Increased muscle spasms, Decreased scar mobility, Decreased activity tolerance, Decreased range of motion, Decreased strength, Impaired UE functional use, Impaired flexibility, Increased edema  Visit Diagnosis: Localized edema  Acute pain of right shoulder  Stiffness of right shoulder, not elsewhere classified     Problem List Patient Active Problem List   Diagnosis Date Noted  . S/P rotator cuff repair 03/18/2014    Scot Jun, PTA 04/11/2020, 8:45 AM  [place] [address] [place], [place], [address] Phone: [phone]   Fax:  [phone]  Name: Stacey Kelly MRN: [id number] Date of Birth: 01-04-1956

## 2020-04-12 ENCOUNTER — Ambulatory Visit: Payer: PRIVATE HEALTH INSURANCE | Admitting: Physical Therapy

## 2020-04-14 ENCOUNTER — Other Ambulatory Visit: Payer: Self-pay

## 2020-04-14 ENCOUNTER — Encounter: Payer: Self-pay | Admitting: Physical Therapy

## 2020-04-14 ENCOUNTER — Ambulatory Visit: Payer: PRIVATE HEALTH INSURANCE | Admitting: Physical Therapy

## 2020-04-14 DIAGNOSIS — R600 Localized edema: Secondary | ICD-10-CM

## 2020-04-14 DIAGNOSIS — M25611 Stiffness of right shoulder, not elsewhere classified: Secondary | ICD-10-CM | POA: Diagnosis not present

## 2020-04-14 DIAGNOSIS — M25511 Pain in right shoulder: Secondary | ICD-10-CM

## 2020-04-14 NOTE — Therapy (Signed)
[place], [place], [address] Phone: [phone]   Fax:  [phone]  Physical Therapy Treatment  Patient Details  Name: TALISSA APPLE MRN: [id number] Date of Birth: 03/09/1956 Referring Provider (PT): RA Theda Sers   Encounter Date: 04/14/2020   PT End of Session - 04/14/20 0839    Visit Number 78    Authorization Type W/C    PT Start Time 0803    PT Stop Time 0842    PT Time Calculation (min) 39 min    Activity Tolerance Patient tolerated treatment well    Behavior During Therapy [facility] for tasks assessed/performed           Past Medical History:  Diagnosis Date  . Depression   . Heart murmur    ASYMPTOMATIC--  ECHO  1995  MILD REGURG  . Migraine   . OA (osteoarthritis)    shoulders  . PONV (postoperative nausea and vomiting)   . Rotator cuff tear, right   . Vitamin D deficiency   . Wears glasses     Past Surgical History:  Procedure Laterality Date  . BUNIONECTOMY Right 2004   BOTH SIDES OF FOOT  . CRYOTHERAPY    . HARDWARE REMOVAL Right 04/15/2014   Procedure: EXCISION OF RIGHT FOOT DORSAL HARDWARE;  Surgeon: Wylene Simmer, MD;  Location: [place];  Service: Orthopedics;  Laterality: Right;  . RIGHT SHOULDER SURGERY  X3  LAST ONE 2010   INCLUDING ROTATOR CUFF REPAIR /  RECONSTRUCTION  . SHOULDER ARTHROSCOPY WITH ROTATOR CUFF REPAIR Left 03/18/2014   Procedure: LEFT SHOULDER ARTHROSCOPY WITH EVALUATION UNDER ANESTHESIA DEBRIDEMENT SUBACROMIAL DECOMPRESSION DISTAL CLAVICAL RESECTION ROTATOR CUFF REPAIR BICEPS  TENDINOSIS;  Surgeon: Sydnee Cabal, MD;  Location: [place];  Service: Orthopedics;  Laterality: Left;  . SHOULDER ARTHROSCOPY WITH ROTATOR CUFF REPAIR Right 06/23/2019   Procedure: SHOULDER ARTHROSCOPY WITH ROTATOR CUFF REPAIR , biceps tenotomy;  Surgeon: Sydnee Cabal, MD;  Location: [facility];  Service: Orthopedics;   Laterality: Right;  . TONSILLECTOMY  AGE 47    There were no vitals filed for this visit.   Subjective Assessment - 04/14/20 0805    Subjective Some soreness in both anterior shoulders from work    Currently in Pain? No/denies                             [facility] Adult PT Treatment/Exercise - 04/14/20 0001      Shoulder Exercises: Standing   External Rotation 20 reps;Right;Strengthening    External Rotation Weight (lbs) 5    Internal Rotation Weights;Strengthening;Right;20 reps    Internal Rotation Weight (lbs) 10    Extension Strengthening;Weights;Both    Extension Weight (lbs) 25    Other Standing Exercises RUE from counter top to 3 cabinet level holding 5lb 2x5;  Abd 4lb 2x5; 90 ER ball toss green ball 2x5; Abd Horiz add down 2x5; Rear flys 5lb 2x10    Other Standing Exercises OHP 3lb 2x10; RUE DC yellow 2x10      Shoulder Exercises: ROM/Strengthening   UBE (Upper Arm Bike) constant work 25 watts 3 min each     Other ROM/Strengthening Exercises Triceps Ext 55 2x12, Biceps curls 25 3x10     Other ROM/Strengthening Exercises chest press 35lb 2x15, 5lb  rev flys rear dely 2x10       Manual Therapy   Manual therapy comments PROM  taken to end range and held, Tight with internal rotation tactile cues needed to keep shoulder down    Passive ROM all direction with end range flexion                    PT Short Term Goals - 07/03/19 1024      PT SHORT TERM GOAL #1   Title independent with initial HEP    Status Achieved             PT Long Term Goals - 04/11/20 0840      PT LONG TERM GOAL #1   Title decrease pain 50%    Status Achieved      PT LONG TERM GOAL #2   Title AROM of the right shoulder to WFL's    Status Achieved      PT LONG TERM GOAL #3   Title report no difficulty with dressing and doing hair    Status Achieved      PT LONG TERM GOAL #4   Title sleep without pain down the arm    Status Achieved      PT LONG TERM GOAL #5    Title lift 5# overhead with right arm    Status Partially Met                 Plan - 04/14/20 0840    Clinical Impression Statement Good effort in today's session. Reports some increase fatigue with today. Good carryover lifting 5lb to an overhead cabinet shelf, but needed some compensation. Some difficulty today and limited ROM with external rotation.    Examination-Activity Limitations Lift;Reach Overhead    Examination-Participation Restrictions Cleaning;Community Activity    Stability/Clinical Decision Making Evolving/Moderate complexity    Rehab Potential Good    PT Frequency 2x / week    PT Duration 8 weeks    PT Treatment/Interventions ADLs/Self Care Home Management;Cryotherapy;Electrical Stimulation;Therapeutic activities;Therapeutic exercise;Patient/family education;Manual techniques;Vasopneumatic Device    PT Next Visit Plan continue to progress scapulohumeral rhythm and strength, easing into OH strengthening           Patient will benefit from skilled therapeutic intervention in order to improve the following deficits and impairments:  Pain, Postural dysfunction, Increased muscle spasms, Decreased scar mobility, Decreased activity tolerance, Decreased range of motion, Decreased strength, Impaired UE functional use, Impaired flexibility, Increased edema  Visit Diagnosis: Acute pain of right shoulder  Localized edema     Problem List Patient Active Problem List   Diagnosis Date Noted  . S/P rotator cuff repair 03/18/2014    Scot Jun, PTA 04/14/2020, 8:42 AM  [place] [address] [place], [place], [address] Phone: [phone]   Fax:  [phone]  Name: ANGELLA MONTAS MRN: [id number] Date of Birth: 07/18/1956

## 2020-04-15 ENCOUNTER — Ambulatory Visit: Payer: PRIVATE HEALTH INSURANCE | Admitting: Physical Therapy

## 2020-04-18 ENCOUNTER — Ambulatory Visit: Payer: PRIVATE HEALTH INSURANCE | Admitting: Physical Therapy

## 2020-04-19 ENCOUNTER — Encounter: Payer: Self-pay | Admitting: Physical Therapy

## 2020-04-19 ENCOUNTER — Ambulatory Visit: Payer: PRIVATE HEALTH INSURANCE | Admitting: Physical Therapy

## 2020-04-19 ENCOUNTER — Other Ambulatory Visit: Payer: Self-pay

## 2020-04-19 DIAGNOSIS — M25611 Stiffness of right shoulder, not elsewhere classified: Secondary | ICD-10-CM | POA: Diagnosis not present

## 2020-04-19 DIAGNOSIS — R600 Localized edema: Secondary | ICD-10-CM

## 2020-04-19 DIAGNOSIS — M25511 Pain in right shoulder: Secondary | ICD-10-CM

## 2020-04-19 NOTE — Therapy (Signed)
[address] [place], [place], [address] Phone: [phone]   Fax:  [phone]  Physical Therapy Treatment  Patient Details  Name: Stacey Kelly MRN: [id number] Date of Birth: 04-Sep-1956 Referring Provider (PT): RA Theda Sers   Encounter Date: 04/19/2020   PT End of Session - 04/19/20 0831    Visit Number 80    Authorization Type W/C    PT Start Time 0800    PT Stop Time 0840    PT Time Calculation (min) 40 min    Activity Tolerance Patient tolerated treatment well    Behavior During Therapy [facility] for tasks assessed/performed           Past Medical History:  Diagnosis Date  . Depression   . Heart murmur    ASYMPTOMATIC--  ECHO  1995  MILD REGURG  . Migraine   . OA (osteoarthritis)    shoulders  . PONV (postoperative nausea and vomiting)   . Rotator cuff tear, right   . Vitamin D deficiency   . Wears glasses     Past Surgical History:  Procedure Laterality Date  . BUNIONECTOMY Right 2004   BOTH SIDES OF FOOT  . CRYOTHERAPY    . HARDWARE REMOVAL Right 04/15/2014   Procedure: EXCISION OF RIGHT FOOT DORSAL HARDWARE;  Surgeon: Wylene Simmer, MD;  Location: [facility];  Service: Orthopedics;  Laterality: Right;  . RIGHT SHOULDER SURGERY  X3  LAST ONE 2010   INCLUDING ROTATOR CUFF REPAIR /  RECONSTRUCTION  . SHOULDER ARTHROSCOPY WITH ROTATOR CUFF REPAIR Left 03/18/2014   Procedure: LEFT SHOULDER ARTHROSCOPY WITH EVALUATION UNDER ANESTHESIA DEBRIDEMENT SUBACROMIAL DECOMPRESSION DISTAL CLAVICAL RESECTION ROTATOR CUFF REPAIR BICEPS  TENDINOSIS;  Surgeon: Sydnee Cabal, MD;  Location: [place];  Service: Orthopedics;  Laterality: Left;  . SHOULDER ARTHROSCOPY WITH ROTATOR CUFF REPAIR Right 06/23/2019   Procedure: SHOULDER ARTHROSCOPY WITH ROTATOR CUFF REPAIR , biceps tenotomy;  Surgeon: Sydnee Cabal, MD;  Location: [facility];  Service: Orthopedics;   Laterality: Right;  . TONSILLECTOMY  AGE 64    There were no vitals filed for this visit.   Subjective Assessment - 04/19/20 0810    Subjective Patient will be having an FCE tomorrow.  Going to the gym 4x/week    Currently in Pain? No/denies              [facility] PT Assessment - 04/19/20 0001      AROM   Right Shoulder Flexion 180 Degrees   mild compensation from the upper trap, winging of scap   Right Shoulder ABduction 180 Degrees   winging of scap mostly eccentric   Right Shoulder Internal Rotation 56 Degrees   very tight anterior shoulder   Right Shoulder External Rotation 90 Degrees      Strength   Right Shoulder Flexion 3+/5    Right Shoulder ABduction 3+/5    Right Shoulder Internal Rotation 4+/5    Right Shoulder External Rotation 4/5                         [facility] Adult PT Treatment/Exercise - 04/19/20 0001      Therapeutic Activites    Therapeutic Activities Work Economist;Lifting    Lifting safe lifting techniques    Work Economist with verbal instruction for safety and form to decrease stress and decrease compensation      Shoulder Exercises: ROM/Strengthening   UBE (Upper Arm  Bike) constant work 320 watts 3 min each       Manual Therapy   Manual Therapy Passive ROM;Joint mobilization    Joint Mobilization to stretch the posterior capsule    Passive ROM all direction with end range flexion                    PT Short Term Goals - 07/03/19 1024      PT SHORT TERM GOAL #1   Title independent with initial HEP    Status Achieved             PT Long Term Goals - 04/19/20 0836      PT LONG TERM GOAL #1   Title decrease pain 50%    Status Achieved      PT LONG TERM GOAL #2   Title AROM of the right shoulder to WFL's    Status Achieved      PT LONG TERM GOAL #3   Title report no difficulty with dressing and doing hair    Status Achieved      PT LONG TERM GOAL #4   Title sleep without pain down the arm    Status  Achieved      PT LONG TERM GOAL #5   Title lift 5# overhead with right arm    Status Partially Met                 Plan - 04/19/20 0831    Clinical Impression Statement Patient is going to have an FCE next Wednesday.  She overall has really improved her ROM and decreased the compensation.  She has limitation with only IR and this is limited most at 90 degrees abduction.  She continues to have some crepitus with flexion and abduction over shoulder height.  She does have scapular winging mostly with eccentric control.  She has good strength at neutral for ER and IR, her limitation is strength for flexion and abduction especially above 90 degrees.  She denies pain with any of the motions    PT Next Visit Plan we will D/C she will have an FCE next week and then see her MD    Consulted and Agree with Plan of Care Patient           Patient will benefit from skilled therapeutic intervention in order to improve the following deficits and impairments:  Pain, Postural dysfunction, Increased muscle spasms, Decreased scar mobility, Decreased activity tolerance, Decreased range of motion, Decreased strength, Impaired UE functional use, Impaired flexibility, Increased edema  Visit Diagnosis: Acute pain of right shoulder  Localized edema  Stiffness of right shoulder, not elsewhere classified     Problem List Patient Active Problem List   Diagnosis Date Noted  . S/P rotator cuff repair 03/18/2014    Sumner Boast., PT 04/19/2020, 8:37 AM  [address] [place], [place], [address] Phone: [phone]   Fax:  [phone]  Name: Stacey Kelly MRN: [id number] Date of Birth: [birth year]

## 2020-04-21 ENCOUNTER — Ambulatory Visit: Payer: PRIVATE HEALTH INSURANCE | Admitting: Physical Therapy

## 2020-04-22 ENCOUNTER — Encounter: Payer: PRIVATE HEALTH INSURANCE | Admitting: Physical Therapy

## 2020-04-25 ENCOUNTER — Encounter: Payer: PRIVATE HEALTH INSURANCE | Admitting: Physical Therapy

## 2020-06-16 DIAGNOSIS — W19XXXA Unspecified fall, initial encounter: Secondary | ICD-10-CM | POA: Diagnosis not present

## 2020-06-16 DIAGNOSIS — T07XXXA Unspecified multiple injuries, initial encounter: Secondary | ICD-10-CM | POA: Diagnosis not present

## 2020-06-17 MED FILL — MUPIROCIN 2% OINTMENT: 2 | 7 days supply | Qty: 22 | Fill #0

## 2020-06-22 DIAGNOSIS — Z23 Encounter for immunization: Secondary | ICD-10-CM | POA: Diagnosis not present

## 2020-07-10 ENCOUNTER — Ambulatory Visit: Payer: 59 | Attending: Internal Medicine

## 2020-07-10 ENCOUNTER — Other Ambulatory Visit: Payer: Self-pay

## 2020-07-10 DIAGNOSIS — Z23 Encounter for immunization: Secondary | ICD-10-CM

## 2020-07-10 NOTE — Progress Notes (Signed)
   Covid-19 Vaccination Clinic  Name:  Stacey Kelly    MRN: [id number] DOB: November 06, 1955  07/10/2020  Stacey Kelly was observed post Covid-19 immunization for 30 minutes based on pre-vaccination screening without incident. She was provided with Vaccine Information Sheet and instruction to access the V-Safe system.   Stacey Kelly was instructed to call 911 with any severe reactions post vaccine: Marland Kitchen Difficulty breathing  . Swelling of face and throat  . A fast heartbeat  . A bad rash all over body  . Dizziness and weakness

## 2020-10-11 ENCOUNTER — Ambulatory Visit (INDEPENDENT_AMBULATORY_CARE_PROVIDER_SITE_OTHER): Payer: 59 | Admitting: Obstetrics & Gynecology

## 2020-10-11 ENCOUNTER — Other Ambulatory Visit: Payer: Self-pay | Admitting: Obstetrics & Gynecology

## 2020-10-11 ENCOUNTER — Encounter: Payer: Self-pay | Admitting: Obstetrics & Gynecology

## 2020-10-11 ENCOUNTER — Other Ambulatory Visit: Payer: Self-pay

## 2020-10-11 VITALS — BP 140/82 | Ht 65.0 in | Wt 158.0 lb

## 2020-10-11 DIAGNOSIS — Z780 Asymptomatic menopausal state: Secondary | ICD-10-CM | POA: Diagnosis not present

## 2020-10-11 DIAGNOSIS — Z01419 Encounter for gynecological examination (general) (routine) without abnormal findings: Secondary | ICD-10-CM

## 2020-10-11 DIAGNOSIS — M8588 Other specified disorders of bone density and structure, other site: Secondary | ICD-10-CM

## 2020-10-11 DIAGNOSIS — Z91040 Latex allergy status: Secondary | ICD-10-CM | POA: Diagnosis not present

## 2020-10-11 DIAGNOSIS — T7840XA Allergy, unspecified, initial encounter: Secondary | ICD-10-CM

## 2020-10-11 MED ORDER — EPINEPHRINE 0.3 MG/0.3ML IJ SOAJ: 0.3000 mg | INTRAMUSCULAR | 1 refills | Status: DC | PRN

## 2020-10-11 MED FILL — EPINEPHRINE 0.3 MG AUTO-INJ: 0.3 | 2 days supply | Qty: 2 | Fill #0

## 2020-10-11 NOTE — Progress Notes (Addendum)
Stacey Kelly 10-02-1956 [id number]   History:    64 y.o. G2P1A1L1 Married.Nurse IV team/US.  SW:[id number] presenting for annual gyn exam   CWC:[id number], well on no HRT.No PMB. No pelvic pain. No pain with IC. Urine normal. Treating constipation. Had a Colonoscopy (benign polyp), Colon found to be tortuous. BMI 26.29. Breasts normal. Health labs with Fam MD.  Past medical history,surgical history, family history and social history were all reviewed and documented in the EPIC chart.  Gynecologic History No LMP recorded. Patient is postmenopausal.  Obstetric History OB History  Gravida Para Term Preterm AB Living  2       1 1   SAB IAB Ectopic Multiple Live Births               # Outcome Date GA Lbr Len/2nd Weight Sex Delivery Anes PTL Lv  2 Gravida           1 AB              ROS: A ROS was performed and pertinent positives and negatives are included in the history.  GENERAL: No fevers or chills. HEENT: No change in vision, no earache, sore throat or sinus congestion. NECK: No pain or stiffness. CARDIOVASCULAR: No chest pain or pressure. No palpitations. PULMONARY: No shortness of breath, cough or wheeze. GASTROINTESTINAL: No abdominal pain, nausea, vomiting or diarrhea, melena or bright red blood per rectum. GENITOURINARY: No urinary frequency, urgency, hesitancy or dysuria. MUSCULOSKELETAL: No joint or muscle pain, no back pain, no recent trauma. DERMATOLOGIC: No rash, no itching, no lesions. ENDOCRINE: No polyuria, polydipsia, no heat or cold intolerance. No recent change in weight. HEMATOLOGICAL: No anemia or easy bruising or bleeding. NEUROLOGIC: No headache, seizures, numbness, tingling or weakness. PSYCHIATRIC: No depression, no loss of interest in normal activity or change in sleep pattern.     Exam:   BP 140/82    Ht 5\' 5"  (1.651 m)    Wt 158 lb (71.7 kg)    BMI 26.29 kg/m   Body mass index is 26.29 kg/m.  General appearance : Well  developed well nourished female. No acute distress HEENT: Eyes: no retinal hemorrhage or exudates,  Neck supple, trachea midline, no carotid bruits, no thyroidmegaly Lungs: Clear to auscultation, no rhonchi or wheezes, or rib retractions  Heart: Regular rate and rhythm, no murmurs or gallops Breast:Examined in sitting and supine position were symmetrical in appearance, no palpable masses or tenderness,  no skin retraction, no nipple inversion, no nipple discharge, no skin discoloration, no axillary or supraclavicular lymphadenopathy Abdomen: no palpable masses or tenderness, no rebound or guarding Extremities: no edema or skin discoloration or tenderness  Pelvic: Vulva: Normal             Vagina: No gross lesions or discharge  Cervix: No gross lesions or discharge.  Pap reflex done.  Uterus  AV, normal size, shape and consistency, non-tender and mobile  Adnexa  Without masses or tenderness  Anus: Normal   Assessment/Plan:  64 y.o. female for annual exam   1. Encounter for routine gynecological examination with Papanicolaou smear of cervix Normal gynecologic exam in menopause.  Pap reflex done.  Breast exam normal.  Patient will schedule screening mammogram January 2022.  Colonoscopy done 2 years ago, benign polyp.  Health labs with family physician.  Body mass index 26.29.  Continue with fitness and healthy nutrition.  2. Postmenopausal Well on no HRT.  No PMB.    3. Osteopenia of lumbar spine  Very mild Osteopenia with a T-Score at -1.2 at the spine 10/2019.  Continue with Vit D supplement, Ca++ 1500 mg daily.  Regular weight bearing physical activities.  4. 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  Epi-Pen prescribed.  Other orders - EPINEPHrine (EPIPEN 2-PAK) 0.3 mg/0.3 mL IJ SOAJ injection; Inject 0.3 mg into the muscle as needed for anaphylaxis.  Princess Bruins MD, 9:18 AM 10/11/2020

## 2020-10-13 LAB — PAP IG W/ RFLX HPV ASCU

## 2020-11-03 ENCOUNTER — Encounter: Payer: Self-pay | Admitting: Obstetrics & Gynecology

## 2020-11-03 DIAGNOSIS — Z1231 Encounter for screening mammogram for malignant neoplasm of breast: Secondary | ICD-10-CM | POA: Diagnosis not present

## 2020-12-05 DIAGNOSIS — M79672 Pain in left foot: Secondary | ICD-10-CM | POA: Diagnosis not present

## 2020-12-06 DIAGNOSIS — D485 Neoplasm of uncertain behavior of skin: Secondary | ICD-10-CM | POA: Diagnosis not present

## 2020-12-06 DIAGNOSIS — L821 Other seborrheic keratosis: Secondary | ICD-10-CM | POA: Diagnosis not present

## 2020-12-06 DIAGNOSIS — L570 Actinic keratosis: Secondary | ICD-10-CM | POA: Diagnosis not present

## 2020-12-06 DIAGNOSIS — D2371 Other benign neoplasm of skin of right lower limb, including hip: Secondary | ICD-10-CM | POA: Diagnosis not present

## 2020-12-06 DIAGNOSIS — D2362 Other benign neoplasm of skin of left upper limb, including shoulder: Secondary | ICD-10-CM | POA: Diagnosis not present

## 2020-12-06 DIAGNOSIS — L814 Other melanin hyperpigmentation: Secondary | ICD-10-CM | POA: Diagnosis not present
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
## 2021-01-24 DIAGNOSIS — L720 Epidermal cyst: Secondary | ICD-10-CM | POA: Diagnosis not present

## 2021-01-24 DIAGNOSIS — L570 Actinic keratosis: Secondary | ICD-10-CM | POA: Diagnosis not present

## 2021-02-23 DIAGNOSIS — M722 Plantar fascial fibromatosis: Secondary | ICD-10-CM | POA: Diagnosis not present

## 2021-02-23 DIAGNOSIS — M25572 Pain in left ankle and joints of left foot: Secondary | ICD-10-CM | POA: Diagnosis not present

## 2021-02-23 DIAGNOSIS — M79672 Pain in left foot: Secondary | ICD-10-CM | POA: Diagnosis not present

## 2021-02-23 DIAGNOSIS — M19072 Primary osteoarthritis, left ankle and foot: Secondary | ICD-10-CM | POA: Diagnosis not present

## 2021-03-10 ENCOUNTER — Other Ambulatory Visit (HOSPITAL_BASED_OUTPATIENT_CLINIC_OR_DEPARTMENT_OTHER): Payer: Self-pay

## 2021-03-10 DIAGNOSIS — E663 Overweight: Secondary | ICD-10-CM | POA: Diagnosis not present

## 2021-03-10 DIAGNOSIS — K5900 Constipation, unspecified: Secondary | ICD-10-CM | POA: Diagnosis not present

## 2021-03-10 DIAGNOSIS — Z0000 Encounter for general adult medical examination without abnormal findings: Secondary | ICD-10-CM | POA: Diagnosis not present

## 2021-03-10 DIAGNOSIS — Z91040 Latex allergy status: Secondary | ICD-10-CM | POA: Diagnosis not present

## 2021-03-10 DIAGNOSIS — Z6826 Body mass index (BMI) 26.0-26.9, adult: Secondary | ICD-10-CM | POA: Diagnosis not present

## 2021-03-10 DIAGNOSIS — M8580 Other specified disorders of bone density and structure, unspecified site: Secondary | ICD-10-CM | POA: Diagnosis not present

## 2021-03-10 DIAGNOSIS — M19072 Primary osteoarthritis, left ankle and foot: Secondary | ICD-10-CM | POA: Diagnosis not present

## 2021-03-10 DIAGNOSIS — M25511 Pain in right shoulder: Secondary | ICD-10-CM | POA: Diagnosis not present

## 2021-03-10 MED ORDER — EPINEPHRINE 0.3 MG/0.3ML IJ SOAJ
INTRAMUSCULAR | 2 refills | Status: AC
  Filled 2021-03-10 – 2021-04-24 (×2): qty 2, 2d supply, fill #0

## 2021-03-17 ENCOUNTER — Other Ambulatory Visit (HOSPITAL_BASED_OUTPATIENT_CLINIC_OR_DEPARTMENT_OTHER): Payer: Self-pay

## 2021-03-22 DIAGNOSIS — M25511 Pain in right shoulder: Secondary | ICD-10-CM | POA: Diagnosis not present

## 2021-03-22 DIAGNOSIS — E663 Overweight: Secondary | ICD-10-CM | POA: Diagnosis not present

## 2021-03-22 DIAGNOSIS — M859 Disorder of bone density and structure, unspecified: Secondary | ICD-10-CM | POA: Diagnosis not present

## 2021-03-22 DIAGNOSIS — R946 Abnormal results of thyroid function studies: Secondary | ICD-10-CM | POA: Diagnosis not present

## 2021-03-22 DIAGNOSIS — M8580 Other specified disorders of bone density and structure, unspecified site: Secondary | ICD-10-CM | POA: Diagnosis not present

## 2021-03-22 DIAGNOSIS — Z0000 Encounter for general adult medical examination without abnormal findings: Secondary | ICD-10-CM | POA: Diagnosis not present

## 2021-03-22 DIAGNOSIS — M19072 Primary osteoarthritis, left ankle and foot: Secondary | ICD-10-CM | POA: Diagnosis not present

## 2021-03-22 DIAGNOSIS — T65811D Toxic effect of latex, accidental (unintentional), subsequent encounter: Secondary | ICD-10-CM | POA: Diagnosis not present

## 2021-03-22 DIAGNOSIS — Z13220 Encounter for screening for lipoid disorders: Secondary | ICD-10-CM | POA: Diagnosis not present

## 2021-03-22 DIAGNOSIS — K5900 Constipation, unspecified: Secondary | ICD-10-CM | POA: Diagnosis not present

## 2021-03-22 DIAGNOSIS — Z6826 Body mass index (BMI) 26.0-26.9, adult: Secondary | ICD-10-CM | POA: Diagnosis not present

## 2021-04-21 ENCOUNTER — Other Ambulatory Visit (HOSPITAL_BASED_OUTPATIENT_CLINIC_OR_DEPARTMENT_OTHER): Payer: Self-pay

## 2021-04-21 MED ORDER — CARESTART COVID-19 HOME TEST VI KIT
PACK | 0 refills | Status: DC
  Filled 2021-04-21: qty 4, 4d supply, fill #0

## 2021-04-24 ENCOUNTER — Other Ambulatory Visit (HOSPITAL_BASED_OUTPATIENT_CLINIC_OR_DEPARTMENT_OTHER): Payer: Self-pay

## 2021-05-02 ENCOUNTER — Other Ambulatory Visit (HOSPITAL_BASED_OUTPATIENT_CLINIC_OR_DEPARTMENT_OTHER): Payer: Self-pay

## 2021-05-09 ENCOUNTER — Other Ambulatory Visit (HOSPITAL_BASED_OUTPATIENT_CLINIC_OR_DEPARTMENT_OTHER): Payer: Self-pay

## 2021-09-04 DIAGNOSIS — I781 Nevus, non-neoplastic: Secondary | ICD-10-CM | POA: Diagnosis not present

## 2021-09-04 DIAGNOSIS — Z411 Encounter for cosmetic surgery: Secondary | ICD-10-CM | POA: Diagnosis not present

## 2021-09-27 ENCOUNTER — Other Ambulatory Visit: Payer: Self-pay

## 2021-09-27 ENCOUNTER — Other Ambulatory Visit (HOSPITAL_COMMUNITY): Payer: Self-pay

## 2021-09-27 ENCOUNTER — Ambulatory Visit: Payer: 59 | Admitting: Podiatry

## 2021-09-27 DIAGNOSIS — L600 Ingrowing nail: Secondary | ICD-10-CM

## 2021-09-27 MED ORDER — GENTAMICIN SULFATE 0.1 % EX CREA
1.0000 | TOPICAL_CREAM | Freq: Two times a day (BID) | CUTANEOUS | 1 refills | Status: DC
Start: 2021-09-27 — End: 2022-11-21
  Filled 2021-09-27: qty 30, 15d supply, fill #0

## 2021-09-27 MED ORDER — GENTAMICIN SULFATE 0.1 % EX CREA
1.0000 | TOPICAL_CREAM | Freq: Two times a day (BID) | CUTANEOUS | 1 refills | Status: DC
Start: 2021-09-27 — End: 2021-09-27
  Filled 2021-09-27: qty 30, 15d supply, fill #0

## 2021-09-27 NOTE — Progress Notes (Signed)
   Subjective: Patient presents today for evaluation of pain to the medial lateral border of the right great toe as well as the left great toe however the right is more symptomatic. Patient is concerned for possible ingrown nail.  It is very sensitive to touch.  Patient presents today for further treatment and evaluation.  Past Medical History:  Diagnosis Date   Depression    Heart murmur    ASYMPTOMATIC--  ECHO  1995  MILD REGURG   Migraine    OA (osteoarthritis)    shoulders   PONV (postoperative nausea and vomiting)    Rotator cuff tear, right    Vitamin D deficiency    Wears glasses     Objective:  General: Well developed, nourished, in no acute distress, alert and oriented x3   Dermatology: Skin is warm, dry and supple bilateral.  Medial lateral border right great toe appears to be erythematous with evidence of an ingrowing nail. Pain on palpation noted to the border of the nail fold. The remaining nails appear unremarkable at this time. There are no open sores, lesions.  Vascular: Dorsalis Pedis artery and Posterior Tibial artery pedal pulses palpable. No lower extremity edema noted.   Neruologic: Grossly intact via light touch bilateral.  Musculoskeletal: No pedal deformities noted  Assesement: #1 Paronychia with ingrowing nail medial and lateral border right great toe #2 Pain in toe  Plan of Care:  1. Patient evaluated.  2. Discussed treatment alternatives and plan of care. Explained nail avulsion procedure and post procedure course to patient. 3. Patient opted for permanent partial nail avulsion of the ingrown portion of the nail.  4. Prior to procedure, local anesthesia infiltration utilized using 3 ml of a 50:50 mixture of 2% plain lidocaine and 0.5% plain marcaine in a normal hallux block fashion and a betadine prep performed.  5. Partial permanent nail avulsion with chemical matrixectomy performed using [id number] applications of phenol followed by alcohol flush.  6.  Light dressing applied.  Post care instructions provided 7.  Prescription for gentamicin 2% cream  8.  Return to clinic 2 weeks.  *Worked with [employer]. 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 Edrick Kins, DPM    [address], [place] [address]                Office [phone]  Fax [phone]

## 2021-10-03 DIAGNOSIS — R7989 Other specified abnormal findings of blood chemistry: Secondary | ICD-10-CM | POA: Diagnosis not present

## 2021-10-03 DIAGNOSIS — R946 Abnormal results of thyroid function studies: Secondary | ICD-10-CM | POA: Diagnosis not present

## 2021-10-03 DIAGNOSIS — L659 Nonscarring hair loss, unspecified: Secondary | ICD-10-CM | POA: Diagnosis not present

## 2021-10-11 ENCOUNTER — Ambulatory Visit: Payer: 59 | Admitting: Podiatry

## 2021-10-11 ENCOUNTER — Other Ambulatory Visit (HOSPITAL_COMMUNITY): Payer: Self-pay

## 2021-10-11 ENCOUNTER — Other Ambulatory Visit: Payer: Self-pay

## 2021-10-11 DIAGNOSIS — L600 Ingrowing nail: Secondary | ICD-10-CM | POA: Diagnosis not present

## 2021-10-11 MED ORDER — DOXYCYCLINE HYCLATE 100 MG PO TABS
100.0000 mg | ORAL_TABLET | Freq: Two times a day (BID) | ORAL | 0 refills | Status: DC
  Filled 2021-10-11: qty 14, 7d supply, fill #0

## 2021-10-11 NOTE — Progress Notes (Signed)
o

## 2021-10-11 NOTE — Progress Notes (Signed)
° °  Subjective: 65 y.o. female presents today status post permanent nail avulsion procedure of the medial and lateral border of the right great toe that was performed on 09/27/2021.  Patient states that she is doing well and she has been soaking her foot and applying the antibiotic cream as instructed.  She continues to have some redness and swelling around the base of the lateral portion of the nail plate.  Otherwise no new complaints at this time.   Past Medical History:  Diagnosis Date   Depression    Heart murmur    ASYMPTOMATIC--  ECHO  1995  MILD REGURG   Migraine    OA (osteoarthritis)    shoulders   PONV (postoperative nausea and vomiting)    Rotator cuff tear, right    Vitamin D deficiency    Wears glasses     Objective: Skin is warm, dry and supple. Nail and respective nail fold appears to be healing appropriately. Open wound to the associated nail fold with a granular wound base and moderate amount of fibrotic tissue to the lateral border of the right hallux nail plate adjacent to the second toe. Minimal drainage noted. Mild erythema around the periungual region likely due to phenol chemical matricectomy but cannot rule out a localized cellulitis.  Assessment: #1 s/p partial permanent nail matrixectomy medial and lateral border RT hallux   Plan of care: #1 patient was evaluated  #2 light debridement of open wound was performed to the periungual border of the respective toe using a currette. Antibiotic ointment and Band-Aid was applied. #3  Continue daily Epsom salt soaks and gentamicin cream  #4 prescription for doxycycline 100 mg 2 times daily #14  #5 patient is to return to clinic on a PRN basis.  I informed the patient that if there is no resolution of her redness and the swelling around the toenail to return to the office over the next week or 2 and we will likely need to perform an I&D to the lateral border of the right hallux nail plate   Edrick Kins, DPM [facility]  Dr. Edrick Kins, DPM    [address], [place] [address]                Office [phone]  Fax [phone]

## 2021-10-12 ENCOUNTER — Ambulatory Visit: Payer: Self-pay | Admitting: Obstetrics & Gynecology

## 2021-10-18 ENCOUNTER — Ambulatory Visit: Payer: 59 | Admitting: Obstetrics & Gynecology

## 2021-10-18 ENCOUNTER — Encounter: Payer: Self-pay | Admitting: Nurse Practitioner

## 2021-10-18 ENCOUNTER — Other Ambulatory Visit: Payer: Self-pay

## 2021-10-18 ENCOUNTER — Ambulatory Visit (INDEPENDENT_AMBULATORY_CARE_PROVIDER_SITE_OTHER): Payer: 59 | Admitting: Nurse Practitioner

## 2021-10-18 ENCOUNTER — Other Ambulatory Visit (HOSPITAL_COMMUNITY)
Admission: RE | Admit: 2021-10-18 | Discharge: 2021-10-18 | Disposition: A | Discharge status: Home / Self Care | Payer: 59 | Source: Ambulatory Visit | Attending: Nurse Practitioner | Admitting: Nurse Practitioner

## 2021-10-18 VITALS — BP 120/76 | Ht 64.5 in | Wt 167.0 lb

## 2021-10-18 DIAGNOSIS — Z01419 Encounter for gynecological examination (general) (routine) without abnormal findings: Secondary | ICD-10-CM | POA: Diagnosis not present

## 2021-10-18 DIAGNOSIS — Z780 Asymptomatic menopausal state: Secondary | ICD-10-CM | POA: Diagnosis not present

## 2021-10-18 DIAGNOSIS — M8589 Other specified disorders of bone density and structure, multiple sites: Secondary | ICD-10-CM

## 2021-10-18 NOTE — Progress Notes (Signed)
° °  Stacey Kelly [birth year] [id number]   History:  65 y.o. G2P0011 presents for annual exam. Postmenopausal - no HRT, no bleeding. History of cryotherapy in 1980s, normal paps since. History of OA, depression, migraines, vitamin D deficiency, IBS.   Gynecologic History No LMP recorded. Patient is postmenopausal.   Contraception/Family planning: post menopausal status Sexually active: Yes  Health Maintenance Last Pap: 10/11/2020. Results were: Normal Last mammogram: 11/03/2020. Results were: Normal Last colonoscopy: 2019. Results were: Benign polyp, 5-year recall Last Dexa: 11/02/2019. Results were: T-score -1.2, FRAX 7.5% / 0.4%  Past medical history, past surgical history, family history and social history were all reviewed and documented in the EPIC chart. 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   ROS:  A ROS was performed and pertinent positives and negatives are included.  Exam:  Vitals:   10/18/21 0934  BP: 120/76  Weight: 167 lb (75.8 kg)  Height: 5' 4.5" (1.638 m)   Body mass index is 28.22 kg/m.  General appearance:  Normal Thyroid:  Symmetrical, normal in size, without palpable masses or nodularity. Respiratory  Auscultation:  Clear without wheezing or rhonchi Cardiovascular  Auscultation:  Regular rate, without rubs, murmurs or gallops  Edema/varicosities:  Not grossly evident Abdominal  Soft,nontender, without masses, guarding or rebound.  Liver/spleen:  No organomegaly noted  Hernia:  None appreciated  Skin  Inspection:  Grossly normal Breasts: Examined lying and sitting.   Right: Without masses, retractions, nipple discharge or axillary adenopathy.   Left: Without masses, retractions, nipple discharge or axillary adenopathy. Genitourinary   Inguinal/mons:  Normal without inguinal adenopathy  External genitalia:  Normal appearing vulva with no masses, tenderness, or lesions  BUS/Urethra/Skene's glands:   Normal  Vagina:  Normal appearing with normal color and discharge, no lesions  Cervix:  Normal appearing without discharge or lesions  Uterus:  Normal in size, shape and contour.  Midline and mobile, nontender  Adnexa/parametria:     Rt: Normal in size, without masses or tenderness.   Lt: Normal in size, without masses or tenderness.  Anus and perineum: Normal  Digital rectal exam: Normal sphincter tone without palpated masses or tenderness  Patient informed chaperone available to be present for breast and pelvic exam. Patient has requested no chaperone to be present. Patient has been advised what will be completed during breast and pelvic exam.   Assessment/Plan:  65 y.o. G2P0011 for annual exam.   Well female exam with routine gynecological exam - Plan: Cytology - PAP( [facility]). Education provided on SBEs, importance of preventative screenings, current guidelines, high calcium diet, regular exercise, and multivitamin daily.  Labs with PCP.   Postmenopausal - Plan: DG Bone Density. No HRT, no bleeding.   Osteopenia of multiple sites - Plan: DG Bone Density. Mild. T-score -1.2. Continue Vitamin D, high calcium diet, and regular exercise. Will repeat DXA now (done at [facility]).   Screening for cervical cancer - History of cryotherapy in 1980s, normal paps since. Discussed current guidelines but she would like pap today as she prefers annual paps. Pap with reflex collected.   Screening for breast cancer - Normal mammogram history.  Continue annual screenings. Mammogram scheduled for next week. Normal breast exam today.  Screening for colon cancer - 2019 colonoscopy. Will repeat at 5-year interval per GI's recommendation.   Return in 1 year for annual.     Tamela Gammon DNP, 10:06 AM 10/18/2021

## 2021-10-20 LAB — CYTOLOGY - PAP: Diagnosis: NEGATIVE

## 2021-11-15 DIAGNOSIS — Z1231 Encounter for screening mammogram for malignant neoplasm of breast: Secondary | ICD-10-CM | POA: Diagnosis not present

## 2021-11-16 ENCOUNTER — Encounter: Payer: Self-pay | Admitting: Nurse Practitioner

## 2021-11-29 DIAGNOSIS — Z780 Asymptomatic menopausal state: Secondary | ICD-10-CM | POA: Diagnosis not present

## 2021-11-29 DIAGNOSIS — M8589 Other specified disorders of bone density and structure, multiple sites: Secondary | ICD-10-CM | POA: Diagnosis not present

## 2021-12-07 DIAGNOSIS — D2371 Other benign neoplasm of skin of right lower limb, including hip: Secondary | ICD-10-CM | POA: Diagnosis not present

## 2021-12-07 DIAGNOSIS — L820 Inflamed seborrheic keratosis: Secondary | ICD-10-CM | POA: Diagnosis not present

## 2021-12-07 DIAGNOSIS — L570 Actinic keratosis: Secondary | ICD-10-CM | POA: Diagnosis not present

## 2021-12-07 DIAGNOSIS — L814 Other melanin hyperpigmentation: Secondary | ICD-10-CM | POA: Diagnosis not present

## 2021-12-07 DIAGNOSIS — L821 Other seborrheic keratosis: Secondary | ICD-10-CM | POA: Diagnosis not present

## 2021-12-15 ENCOUNTER — Encounter: Payer: Self-pay | Admitting: Nurse Practitioner

## 2022-04-26 ENCOUNTER — Other Ambulatory Visit (HOSPITAL_BASED_OUTPATIENT_CLINIC_OR_DEPARTMENT_OTHER): Payer: Self-pay

## 2022-04-26 DIAGNOSIS — T65811D Toxic effect of latex, accidental (unintentional), subsequent encounter: Secondary | ICD-10-CM | POA: Diagnosis not present

## 2022-04-26 DIAGNOSIS — R635 Abnormal weight gain: Secondary | ICD-10-CM | POA: Diagnosis not present

## 2022-04-26 DIAGNOSIS — M8580 Other specified disorders of bone density and structure, unspecified site: Secondary | ICD-10-CM | POA: Diagnosis not present

## 2022-04-26 DIAGNOSIS — Z0000 Encounter for general adult medical examination without abnormal findings: Secondary | ICD-10-CM | POA: Diagnosis not present

## 2022-04-26 DIAGNOSIS — K5900 Constipation, unspecified: Secondary | ICD-10-CM | POA: Diagnosis not present

## 2022-04-26 DIAGNOSIS — E663 Overweight: Secondary | ICD-10-CM | POA: Diagnosis not present

## 2022-04-26 DIAGNOSIS — R946 Abnormal results of thyroid function studies: Secondary | ICD-10-CM | POA: Diagnosis not present

## 2022-04-26 MED ORDER — EPINEPHRINE 0.3 MG/0.3ML IJ SOAJ
INTRAMUSCULAR | 1 refills | Status: AC
  Filled 2022-04-26: qty 2, 2d supply, fill #0
  Filled 2022-05-30: qty 2, 30d supply, fill #0

## 2022-05-04 ENCOUNTER — Other Ambulatory Visit (HOSPITAL_BASED_OUTPATIENT_CLINIC_OR_DEPARTMENT_OTHER): Payer: Self-pay

## 2022-05-08 DIAGNOSIS — R7989 Other specified abnormal findings of blood chemistry: Secondary | ICD-10-CM | POA: Diagnosis not present

## 2022-05-08 DIAGNOSIS — R946 Abnormal results of thyroid function studies: Secondary | ICD-10-CM | POA: Diagnosis not present

## 2022-05-08 DIAGNOSIS — Z0000 Encounter for general adult medical examination without abnormal findings: Secondary | ICD-10-CM | POA: Diagnosis not present

## 2022-05-08 DIAGNOSIS — Z13220 Encounter for screening for lipoid disorders: Secondary | ICD-10-CM | POA: Diagnosis not present

## 2022-05-30 ENCOUNTER — Other Ambulatory Visit (HOSPITAL_BASED_OUTPATIENT_CLINIC_OR_DEPARTMENT_OTHER): Payer: Self-pay

## 2022-05-31 DIAGNOSIS — H5203 Hypermetropia, bilateral: Secondary | ICD-10-CM | POA: Diagnosis not present

## 2022-05-31 DIAGNOSIS — H524 Presbyopia: Secondary | ICD-10-CM | POA: Diagnosis not present

## 2022-06-01 ENCOUNTER — Other Ambulatory Visit (HOSPITAL_BASED_OUTPATIENT_CLINIC_OR_DEPARTMENT_OTHER): Payer: Self-pay

## 2022-06-05 ENCOUNTER — Other Ambulatory Visit (HOSPITAL_BASED_OUTPATIENT_CLINIC_OR_DEPARTMENT_OTHER): Payer: Self-pay

## 2022-07-13 ENCOUNTER — Ambulatory Visit (INDEPENDENT_AMBULATORY_CARE_PROVIDER_SITE_OTHER): Payer: 59

## 2022-07-13 ENCOUNTER — Ambulatory Visit
Admission: EM | Admit: 2022-07-13 | Discharge: 2022-07-13 | Disposition: A | Discharge status: Home / Self Care | Payer: 59 | Attending: Urgent Care | Admitting: Urgent Care

## 2022-07-13 ENCOUNTER — Encounter: Payer: Self-pay | Admitting: Emergency Medicine

## 2022-07-13 DIAGNOSIS — R0982 Postnasal drip: Secondary | ICD-10-CM | POA: Diagnosis not present

## 2022-07-13 DIAGNOSIS — J189 Pneumonia, unspecified organism: Secondary | ICD-10-CM | POA: Diagnosis not present

## 2022-07-13 DIAGNOSIS — R059 Cough, unspecified: Secondary | ICD-10-CM | POA: Diagnosis not present

## 2022-07-13 DIAGNOSIS — R0981 Nasal congestion: Secondary | ICD-10-CM | POA: Diagnosis not present

## 2022-07-13 DIAGNOSIS — R058 Other specified cough: Secondary | ICD-10-CM | POA: Diagnosis not present

## 2022-07-13 DIAGNOSIS — R519 Headache, unspecified: Secondary | ICD-10-CM | POA: Diagnosis not present

## 2022-07-13 MED ORDER — LEVOCETIRIZINE DIHYDROCHLORIDE 5 MG PO TABS: 5.0000 mg | ORAL_TABLET | Freq: Every evening | ORAL | 0 refills | Status: DC

## 2022-07-13 MED ORDER — PROMETHAZINE-DM 6.25-15 MG/5ML PO SYRP: 2.5000 mL | ORAL_SOLUTION | Freq: Three times a day (TID) | ORAL | 0 refills | Status: DC | PRN

## 2022-07-13 MED ORDER — AMOXICILLIN-POT CLAVULANATE 875-125 MG PO TABS: 1.0000 | ORAL_TABLET | Freq: Two times a day (BID) | ORAL | 0 refills | Status: DC

## 2022-07-13 NOTE — ED Triage Notes (Signed)
Pt here with headache, congestion, strong and persistent cough x 15 days.

## 2022-07-13 NOTE — ED Provider Notes (Signed)
[facility]  Note:  This document was prepared using Systems analyst and may include unintentional dictation errors.  MRN: [id number] DOB: May 29, 1956  Subjective:   Stacey Kelly is a 66 y.o. female presenting for 15 day history of acute onset productive cough, left sided chest pain, upper chest congestion, throat pain from coughing.  She is also started to have lymph node pain over the neck area.  She did have some sinus headaches and sinus congestion but this improved.  She took 2 COVID test both were negative.  No history of respiratory disorders.  Patient is otherwise very healthy.  No current facility-administered medications for this encounter.  Current Outpatient Medications:    Ascorbic Acid (VITAMIN C) 1000 MG tablet, Take 1,000 mg by mouth daily., Disp: , Rfl:    Biotin 10000 MCG TABS, 1 tablet, Disp: , Rfl:    Calcium Carb-Cholecalciferol (CALCIUM 600 + D PO), Take 1 tablet by mouth 2 (two) times daily., Disp: , Rfl:    cetirizine (ZYRTEC) 10 MG tablet, Take 10 mg by mouth every evening., Disp: , Rfl:    Cholecalciferol (VITAMIN D3) 2000 UNITS TABS, Take 1 capsule by mouth daily., Disp: , Rfl:    COVID-19 At Home Antigen Test (CARESTART COVID-19 HOME TEST) KIT, Use as directed within package instructions, Disp: 4 each, Rfl: 0   doxycycline (VIBRA-TABS) 100 MG tablet, Take 1 tablet (100 mg total) by mouth 2 (two) times daily. (Patient not taking: Reported on 10/18/2021), Disp: 14 tablet, Rfl: 0   EPINEPHrine (EPIPEN 2-PAK) 0.3 mg/0.3 mL IJ SOAJ injection, Use as directed as needed, Disp: 2 each, Rfl: 1   EPINEPHrine 0.3 mg/0.3 mL IJ SOAJ injection, Inject as needed as directed, Disp: 2 each, Rfl: 2   fluticasone (FLONASE) 50 MCG/ACT nasal spray, Place 1 spray into both nostrils at bedtime. , Disp: , Rfl:    gentamicin cream (GARAMYCIN) 0.1 %, Apply 1 application topically 2 (two) times daily., Disp: 30 g, Rfl: 1   Multiple Vitamin  (MULTIVITAMIN) tablet, Take 1 tablet by mouth daily., Disp: , Rfl:    Red Yeast Rice 600 MG CAPS, Take by mouth., Disp: , Rfl:    Zinc 50 MG TABS, Take by mouth daily., Disp: , Rfl:    Allergies  Allergen Reactions   Latex Anaphylaxis    Past Medical History:  Diagnosis Date   Depression    Heart murmur    ASYMPTOMATIC--  ECHO  1995  MILD REGURG   Migraine    OA (osteoarthritis)    shoulders   PONV (postoperative nausea and vomiting)    Rotator cuff tear, right    Vitamin D deficiency    Wears glasses      Past Surgical History:  Procedure Laterality Date   BUNIONECTOMY Right 2004   BOTH SIDES OF FOOT   CRYOTHERAPY     HARDWARE REMOVAL Right 04/15/2014   Procedure: EXCISION OF RIGHT FOOT DORSAL HARDWARE;  Surgeon: Wylene Simmer, MD;  Location: [facility];  Service: Orthopedics;  Laterality: Right;   RIGHT SHOULDER SURGERY  X3  LAST ONE 2010   INCLUDING ROTATOR CUFF REPAIR /  RECONSTRUCTION   SHOULDER ARTHROSCOPY WITH ROTATOR CUFF REPAIR Left 03/18/2014   Procedure: LEFT SHOULDER ARTHROSCOPY WITH EVALUATION UNDER ANESTHESIA DEBRIDEMENT SUBACROMIAL DECOMPRESSION DISTAL CLAVICAL RESECTION ROTATOR CUFF REPAIR BICEPS  TENDINOSIS;  Surgeon: Sydnee Cabal, MD;  Location: [facility];  Service: Orthopedics;  Laterality: Left;   SHOULDER ARTHROSCOPY  WITH ROTATOR CUFF REPAIR Right 06/23/2019   Procedure: SHOULDER ARTHROSCOPY WITH ROTATOR CUFF REPAIR , biceps tenotomy;  Surgeon: Sydnee Cabal, MD;  Location: [facility];  Service: Orthopedics;  Laterality: Right;   Tonail surg Right    TONSILLECTOMY  AGE 34    Family History  Problem Relation Age of Onset   Pulmonary embolism Mother    Heart disease Father    Cancer Sister     Social History   Tobacco Use   Smoking status: Never   Smokeless tobacco: Never  Vaping Use   Vaping Use: Never used  Substance Use Topics   Alcohol use: Yes    Comment: rare   Drug use: Never     ROS   Objective:   Vitals: BP (!) 153/80   Pulse 76   Temp 99.1 F (37.3 C)   Resp 20   SpO2 92%   Physical Exam Constitutional:      General: She is not in acute distress.    Appearance: Normal appearance. She is well-developed. She is not ill-appearing, toxic-appearing or diaphoretic.  HENT:     Head: Normocephalic and atraumatic.     Nose: Nose normal.     Mouth/Throat:     Mouth: Mucous membranes are moist.  Eyes:     General: No scleral icterus.       Right eye: No discharge.        Left eye: No discharge.     Extraocular Movements: Extraocular movements intact.  Cardiovascular:     Rate and Rhythm: Normal rate and regular rhythm.     Heart sounds: Normal heart sounds. No murmur heard.    No friction rub. No gallop.  Pulmonary:     Effort: Pulmonary effort is normal. No respiratory distress.     Breath sounds: No stridor. No wheezing, rhonchi or rales.     Comments: Slight decrease in lung sounds in bibasilar fields bilaterally. Chest:     Chest wall: No tenderness.  Skin:    General: Skin is warm and dry.  Neurological:     General: No focal deficit present.     Mental Status: She is alert and oriented to person, place, and time.  Psychiatric:        Mood and Affect: Mood normal.        Behavior: Behavior normal.     DG Chest 2 View  Result Date: 07/13/2022 CLINICAL DATA:  Cough for 2 weeks, headache, congestion EXAM: CHEST - 2 VIEW COMPARISON:  None Available. FINDINGS: Frontal and lateral views of the chest demonstrate an unremarkable cardiac silhouette. No acute airspace disease, effusion, or pneumothorax. No acute bony abnormalities. IMPRESSION: 1. No acute intrathoracic process. Electronically Signed   By: Randa Ngo M.D.   On: 07/13/2022 18:36     Assessment and Plan :   PDMP not reviewed this encounter.  1. Community acquired pneumonia, unspecified laterality   2. Productive cough   3. Post-nasal drainage   4. Sinus congestion     Despite the negative chest x-ray, her symptoms may coverage for a lower respiratory infection, community-acquired pneumonia likely of the left side given sided chest pain.  Recommended Augmentin.  Use supportive care otherwise.  We will hold off on oral prednisone course for now.  However, I advised that if she continues to have symptoms by Sunday, call the clinic and I will help with this prednisone course. Counseled patient on potential for adverse effects with medications prescribed/recommended today, ER  and return-to-clinic precautions discussed, patient verbalized understanding.    Jaynee Eagles, PA-C 07/13/22 1850

## 2022-09-14 DIAGNOSIS — M65331 Trigger finger, right middle finger: Secondary | ICD-10-CM | POA: Diagnosis not present

## 2022-09-14 DIAGNOSIS — M79641 Pain in right hand: Secondary | ICD-10-CM | POA: Diagnosis not present

## 2022-10-24 ENCOUNTER — Ambulatory Visit: Payer: 59 | Admitting: Nurse Practitioner

## 2022-11-21 ENCOUNTER — Ambulatory Visit (INDEPENDENT_AMBULATORY_CARE_PROVIDER_SITE_OTHER): Payer: 59 | Admitting: Obstetrics & Gynecology

## 2022-11-21 ENCOUNTER — Encounter: Payer: Self-pay | Admitting: Obstetrics & Gynecology

## 2022-11-21 ENCOUNTER — Other Ambulatory Visit (HOSPITAL_COMMUNITY)
Admission: RE | Admit: 2022-11-21 | Discharge: 2022-11-21 | Disposition: A | Discharge status: Home / Self Care | Payer: 59 | Source: Ambulatory Visit | Attending: Nurse Practitioner | Admitting: Nurse Practitioner

## 2022-11-21 VITALS — BP 114/64 | HR 79 | Ht 64.75 in | Wt 168.0 lb

## 2022-11-21 DIAGNOSIS — M8589 Other specified disorders of bone density and structure, multiple sites: Secondary | ICD-10-CM

## 2022-11-21 DIAGNOSIS — Z01419 Encounter for gynecological examination (general) (routine) without abnormal findings: Secondary | ICD-10-CM | POA: Diagnosis not present

## 2022-11-21 DIAGNOSIS — Z1231 Encounter for screening mammogram for malignant neoplasm of breast: Secondary | ICD-10-CM | POA: Diagnosis not present

## 2022-11-21 DIAGNOSIS — Z780 Asymptomatic menopausal state: Secondary | ICD-10-CM

## 2022-11-21 NOTE — Progress Notes (Signed)
Stacey Kelly 20-May-1956 [id number]   History:    67 y.o.  G2P1A1L1 Married.  Nurse IV team/US.     RP:  Established patient presenting for annual gyn exam    HPI: Postmenopause, well on no HRT.  No PMB.  No pelvic pain.  Abstinent. Pap Neg 09/2021. Urine normal.  Treating constipation.  Had a Colonoscopy (benign polyp) in 2019, Colon found to be tortuous.  Will contact Gertie Fey this year to decide on screening.  BMI 28.17.  Breasts normal. Mammo 10/2021 Neg.  BD Osteopenia AP Spine -1.6 in 11/2021. Health labs with Fam MD. Flu vaccine at pharmacy.   Past medical history,surgical history, family history and social history were all reviewed and documented in the EPIC chart.  Gynecologic History No LMP recorded. Patient is postmenopausal.  Obstetric History OB History  Gravida Para Term Preterm AB Living  '2 1   1 1 1  '$ SAB IAB Ectopic Multiple Live Births    1          # Outcome Date GA Lbr Len/2nd Weight Sex Delivery Anes PTL Lv  2 Preterm           1 IAB              ROS: A ROS was performed and pertinent positives and negatives are included in the history. GENERAL: No fevers or chills. HEENT: No change in vision, no earache, sore throat or sinus congestion. NECK: No pain or stiffness. CARDIOVASCULAR: No chest pain or pressure. No palpitations. PULMONARY: No shortness of breath, cough or wheeze. GASTROINTESTINAL: No abdominal pain, nausea, vomiting or diarrhea, melena or bright red blood per rectum. GENITOURINARY: No urinary frequency, urgency, hesitancy or dysuria. MUSCULOSKELETAL: No joint or muscle pain, no back pain, no recent trauma. DERMATOLOGIC: No rash, no itching, no lesions. ENDOCRINE: No polyuria, polydipsia, no heat or cold intolerance. No recent change in weight. HEMATOLOGICAL: No anemia or easy bruising or bleeding. NEUROLOGIC: No headache, seizures, numbness, tingling or weakness. PSYCHIATRIC: No depression, no loss of interest in normal activity or change in sleep pattern.      Exam:   BP 114/64   Pulse 79   Ht 5' 4.75" (1.645 m)   Wt 168 lb (76.2 kg)   SpO2 98%   BMI 28.17 kg/m   Body mass index is 28.17 kg/m.  General appearance : Well developed well nourished female. No acute distress HEENT: Eyes: no retinal hemorrhage or exudates,  Neck supple, trachea midline, no carotid bruits, no thyroidmegaly Lungs: Clear to auscultation, no rhonchi or wheezes, or rib retractions  Heart: Regular rate and rhythm, no murmurs or gallops Breast:Examined in sitting and supine position were symmetrical in appearance, no palpable masses or tenderness,  no skin retraction, no nipple inversion, no nipple discharge, no skin discoloration, no axillary or supraclavicular lymphadenopathy Abdomen: no palpable masses or tenderness, no rebound or guarding Extremities: no edema or skin discoloration or tenderness  Pelvic: Vulva: Normal             Vagina: No gross lesions or discharge  Cervix: No gross lesions or discharge. Pap reflex done  Uterus  AV, normal size, shape and consistency, non-tender and mobile  Adnexa  Without masses or tenderness  Anus: Normal   Assessment/Plan:  67 y.o. female for annual exam   1. Encounter for routine gynecological examination with Papanicolaou smear of cervix Postmenopause, well on no HRT.  No PMB.  No pelvic pain. Abstinent. Pap Neg 09/2021. Urine normal. Treating  constipation.  Had a Colonoscopy (benign polyp) in 2019, Colon found to be tortuous.  Will contact Gertie Fey this year to decide on screening.  BMI 28.17.  Breasts normal. Mammo 10/2021 Neg.  BD Osteopenia AP Spine -1.6 in 11/2021. Health labs with Fam MD. Flu vaccine at pharmacy. - Cytology - PAP( )  2. Postmenopausal Postmenopause, well on no HRT.  No PMB.  No pelvic pain. Abstinent.  3. Osteopenia of multiple sites BD Osteopenia AP Spine -1.6 in 11/2021. Repeat BD at 3 years.  Other orders - PSYLLIUM PO; Take by mouth. - Cyanocobalamin (B-12 PO); Take by mouth.    Princess Bruins MD, 11:16 AM

## 2022-11-22 ENCOUNTER — Encounter: Payer: Self-pay | Admitting: Obstetrics & Gynecology

## 2022-11-27 LAB — CYTOLOGY - PAP: Diagnosis: NEGATIVE

## 2023-01-03 DIAGNOSIS — D2272 Melanocytic nevi of left lower limb, including hip: Secondary | ICD-10-CM | POA: Diagnosis not present

## 2023-01-03 DIAGNOSIS — L570 Actinic keratosis: Secondary | ICD-10-CM | POA: Diagnosis not present

## 2023-01-03 DIAGNOSIS — L821 Other seborrheic keratosis: Secondary | ICD-10-CM | POA: Diagnosis not present

## 2023-01-03 DIAGNOSIS — D225 Melanocytic nevi of trunk: Secondary | ICD-10-CM | POA: Diagnosis not present

## 2023-01-03 DIAGNOSIS — L814 Other melanin hyperpigmentation: Secondary | ICD-10-CM | POA: Diagnosis not present

## 2023-01-03 DIAGNOSIS — L918 Other hypertrophic disorders of the skin: Secondary | ICD-10-CM | POA: Diagnosis not present

## 2023-01-03 DIAGNOSIS — D2371 Other benign neoplasm of skin of right lower limb, including hip: Secondary | ICD-10-CM | POA: Diagnosis not present

## 2023-02-18 ENCOUNTER — Other Ambulatory Visit (HOSPITAL_BASED_OUTPATIENT_CLINIC_OR_DEPARTMENT_OTHER): Payer: Self-pay

## 2023-02-18 DIAGNOSIS — Z6828 Body mass index (BMI) 28.0-28.9, adult: Secondary | ICD-10-CM | POA: Diagnosis not present

## 2023-02-18 DIAGNOSIS — H109 Unspecified conjunctivitis: Secondary | ICD-10-CM | POA: Diagnosis not present

## 2023-02-18 MED ORDER — ERYTHROMYCIN 5 MG/GM OP OINT
1.0000 | TOPICAL_OINTMENT | Freq: Four times a day (QID) | OPHTHALMIC | 0 refills | Status: AC
  Filled 2023-02-18 (×2): qty 3.5, 7d supply, fill #0

## 2023-04-30 DIAGNOSIS — M8580 Other specified disorders of bone density and structure, unspecified site: Secondary | ICD-10-CM | POA: Diagnosis not present

## 2023-04-30 DIAGNOSIS — Z131 Encounter for screening for diabetes mellitus: Secondary | ICD-10-CM | POA: Diagnosis not present

## 2023-04-30 DIAGNOSIS — J301 Allergic rhinitis due to pollen: Secondary | ICD-10-CM | POA: Diagnosis not present

## 2023-04-30 DIAGNOSIS — M19072 Primary osteoarthritis, left ankle and foot: Secondary | ICD-10-CM | POA: Diagnosis not present

## 2023-04-30 DIAGNOSIS — E785 Hyperlipidemia, unspecified: Secondary | ICD-10-CM | POA: Diagnosis not present

## 2023-04-30 DIAGNOSIS — E559 Vitamin D deficiency, unspecified: Secondary | ICD-10-CM | POA: Diagnosis not present

## 2023-04-30 DIAGNOSIS — E079 Disorder of thyroid, unspecified: Secondary | ICD-10-CM | POA: Diagnosis not present

## 2023-04-30 DIAGNOSIS — Z0000 Encounter for general adult medical examination without abnormal findings: Secondary | ICD-10-CM | POA: Diagnosis not present

## 2023-04-30 DIAGNOSIS — K5900 Constipation, unspecified: Secondary | ICD-10-CM | POA: Diagnosis not present

## 2023-04-30 DIAGNOSIS — Z23 Encounter for immunization: Secondary | ICD-10-CM | POA: Diagnosis not present

## 2023-05-01 ENCOUNTER — Other Ambulatory Visit (HOSPITAL_BASED_OUTPATIENT_CLINIC_OR_DEPARTMENT_OTHER): Payer: Self-pay

## 2023-05-01 MED ORDER — LEVOTHYROXINE SODIUM 50 MCG PO TABS
50.0000 ug | ORAL_TABLET | Freq: Every morning | ORAL | 1 refills | Status: DC
  Filled 2023-05-01: qty 30, 30d supply, fill #0
  Filled 2023-05-29: qty 30, 30d supply, fill #1

## 2023-05-03 ENCOUNTER — Other Ambulatory Visit (HOSPITAL_BASED_OUTPATIENT_CLINIC_OR_DEPARTMENT_OTHER): Payer: Self-pay

## 2023-05-03 ENCOUNTER — Other Ambulatory Visit: Payer: Self-pay

## 2023-05-06 DIAGNOSIS — E785 Hyperlipidemia, unspecified: Secondary | ICD-10-CM | POA: Diagnosis not present

## 2023-05-07 ENCOUNTER — Other Ambulatory Visit (HOSPITAL_BASED_OUTPATIENT_CLINIC_OR_DEPARTMENT_OTHER): Payer: Self-pay

## 2023-05-07 MED ORDER — ATORVASTATIN CALCIUM 10 MG PO TABS
10.0000 mg | ORAL_TABLET | Freq: Every day | ORAL | 3 refills | Status: DC
  Filled 2023-05-07: qty 90, 90d supply, fill #0

## 2023-05-21 ENCOUNTER — Other Ambulatory Visit (HOSPITAL_BASED_OUTPATIENT_CLINIC_OR_DEPARTMENT_OTHER): Payer: Self-pay

## 2023-05-31 DIAGNOSIS — M65331 Trigger finger, right middle finger: Secondary | ICD-10-CM | POA: Diagnosis not present

## 2023-06-14 ENCOUNTER — Other Ambulatory Visit (HOSPITAL_BASED_OUTPATIENT_CLINIC_OR_DEPARTMENT_OTHER): Payer: Self-pay

## 2023-06-19 DIAGNOSIS — R946 Abnormal results of thyroid function studies: Secondary | ICD-10-CM | POA: Diagnosis not present

## 2023-06-21 ENCOUNTER — Other Ambulatory Visit (HOSPITAL_BASED_OUTPATIENT_CLINIC_OR_DEPARTMENT_OTHER): Payer: Self-pay

## 2023-06-21 DIAGNOSIS — E039 Hypothyroidism, unspecified: Secondary | ICD-10-CM | POA: Diagnosis not present

## 2023-06-21 DIAGNOSIS — R600 Localized edema: Secondary | ICD-10-CM | POA: Diagnosis not present

## 2023-06-21 MED ORDER — LEVOTHYROXINE SODIUM 75 MCG PO TABS
75.0000 ug | ORAL_TABLET | Freq: Every day | ORAL | 1 refills | Status: DC
  Filled 2023-06-21: qty 30, 30d supply, fill #0
  Filled 2023-07-15: qty 30, 30d supply, fill #1

## 2023-07-16 ENCOUNTER — Other Ambulatory Visit (HOSPITAL_BASED_OUTPATIENT_CLINIC_OR_DEPARTMENT_OTHER): Payer: Self-pay

## 2023-08-18 ENCOUNTER — Other Ambulatory Visit (HOSPITAL_BASED_OUTPATIENT_CLINIC_OR_DEPARTMENT_OTHER): Payer: Self-pay

## 2023-08-19 ENCOUNTER — Other Ambulatory Visit (HOSPITAL_BASED_OUTPATIENT_CLINIC_OR_DEPARTMENT_OTHER): Payer: Self-pay

## 2023-08-19 MED ORDER — LEVOTHYROXINE SODIUM 75 MCG PO TABS
75.0000 ug | ORAL_TABLET | Freq: Every morning | ORAL | 0 refills | Status: DC
  Filled 2023-08-19: qty 30, 30d supply, fill #0

## 2023-09-20 ENCOUNTER — Other Ambulatory Visit (HOSPITAL_BASED_OUTPATIENT_CLINIC_OR_DEPARTMENT_OTHER): Payer: Self-pay

## 2023-09-20 ENCOUNTER — Encounter (HOSPITAL_BASED_OUTPATIENT_CLINIC_OR_DEPARTMENT_OTHER): Payer: Self-pay

## 2024-01-02 ENCOUNTER — Encounter (HOSPITAL_BASED_OUTPATIENT_CLINIC_OR_DEPARTMENT_OTHER): Payer: Self-pay | Admitting: Orthopedic Surgery

## 2024-01-02 ENCOUNTER — Other Ambulatory Visit: Payer: Self-pay

## 2024-01-03 ENCOUNTER — Encounter (HOSPITAL_BASED_OUTPATIENT_CLINIC_OR_DEPARTMENT_OTHER)
Admission: RE | Admit: 2024-01-03 | Discharge: 2024-01-03 | Disposition: A | Discharge status: Home / Self Care | Payer: Self-pay | Source: Ambulatory Visit | Attending: Orthopedic Surgery | Admitting: Orthopedic Surgery

## 2024-01-08 ENCOUNTER — Ambulatory Visit (HOSPITAL_BASED_OUTPATIENT_CLINIC_OR_DEPARTMENT_OTHER): Admitting: Anesthesiology

## 2024-01-08 ENCOUNTER — Ambulatory Visit (HOSPITAL_BASED_OUTPATIENT_CLINIC_OR_DEPARTMENT_OTHER)
Admission: RE | Admit: 2024-01-08 | Discharge: 2024-01-08 | Disposition: A | Discharge status: Home / Self Care | Payer: Commercial Managed Care - PPO | Attending: Orthopedic Surgery | Admitting: Orthopedic Surgery

## 2024-01-08 ENCOUNTER — Encounter (HOSPITAL_BASED_OUTPATIENT_CLINIC_OR_DEPARTMENT_OTHER)
Admission: RE | Disposition: A | Discharge status: Home / Self Care | Payer: Self-pay | Source: Home / Self Care | Attending: Orthopedic Surgery

## 2024-01-08 ENCOUNTER — Encounter (HOSPITAL_BASED_OUTPATIENT_CLINIC_OR_DEPARTMENT_OTHER): Payer: Self-pay | Admitting: Orthopedic Surgery

## 2024-01-08 ENCOUNTER — Other Ambulatory Visit: Payer: Self-pay

## 2024-01-08 DIAGNOSIS — Z79890 Hormone replacement therapy (postmenopausal): Secondary | ICD-10-CM | POA: Diagnosis not present

## 2024-01-08 DIAGNOSIS — M65331 Trigger finger, right middle finger: Secondary | ICD-10-CM | POA: Insufficient documentation

## 2024-01-08 DIAGNOSIS — E039 Hypothyroidism, unspecified: Secondary | ICD-10-CM | POA: Diagnosis not present

## 2024-01-08 HISTORY — PX: TRIGGER FINGER RELEASE: SHX641

## 2024-01-08 SURGERY — RELEASE, A1 PULLEY, FOR TRIGGER FINGER
Anesthesia: Monitor Anesthesia Care | Site: Hand | Laterality: Right

## 2024-01-08 MED ORDER — OXYCODONE HCL 5 MG/5ML PO SOLN: 5.0000 mg | Freq: Once | ORAL | Status: DC | PRN

## 2024-01-08 MED ORDER — MIDAZOLAM HCL 2 MG/2ML IJ SOLN
INTRAMUSCULAR | Status: DC | PRN
  Administered 2024-01-08: 2 mg via INTRAVENOUS

## 2024-01-08 MED ORDER — BUPIVACAINE HCL 0.25 % IJ SOLN
INTRAMUSCULAR | Status: DC | PRN
Start: 2024-01-08 — End: 2024-01-08
  Administered 2024-01-08: 10 mL

## 2024-01-08 MED ORDER — PROPOFOL 10 MG/ML IV BOLUS
INTRAVENOUS | Status: DC | PRN
  Administered 2024-01-08: 10 mg via INTRAVENOUS

## 2024-01-08 MED ORDER — FENTANYL CITRATE (PF) 100 MCG/2ML IJ SOLN
INTRAMUSCULAR | Status: AC
  Filled 2024-01-08: qty 2

## 2024-01-08 MED ORDER — DEXAMETHASONE SODIUM PHOSPHATE 10 MG/ML IJ SOLN
INTRAMUSCULAR | Status: DC | PRN
  Administered 2024-01-08: 8 mg via INTRAVENOUS

## 2024-01-08 MED ORDER — OXYCODONE HCL 5 MG PO TABS: 5.0000 mg | ORAL_TABLET | Freq: Once | ORAL | Status: DC | PRN

## 2024-01-08 MED ORDER — FENTANYL CITRATE (PF) 100 MCG/2ML IJ SOLN: 25.0000 ug | INTRAMUSCULAR | Status: DC | PRN

## 2024-01-08 MED ORDER — SODIUM CHLORIDE 0.9 % IR SOLN
Status: DC | PRN
  Administered 2024-01-08: 100 mL

## 2024-01-08 MED ORDER — DEXAMETHASONE SODIUM PHOSPHATE 10 MG/ML IJ SOLN
INTRAMUSCULAR | Status: AC
  Filled 2024-01-08: qty 2

## 2024-01-08 MED ORDER — ONDANSETRON HCL 4 MG/2ML IJ SOLN
INTRAMUSCULAR | Status: DC | PRN
  Administered 2024-01-08: 4 mg via INTRAVENOUS

## 2024-01-08 MED ORDER — LIDOCAINE HCL (CARDIAC) PF 100 MG/5ML IV SOSY
PREFILLED_SYRINGE | INTRAVENOUS | Status: DC | PRN
  Administered 2024-01-08: 20 mg via INTRAVENOUS

## 2024-01-08 MED ORDER — PROPOFOL 500 MG/50ML IV EMUL
INTRAVENOUS | Status: DC | PRN
  Administered 2024-01-08: 150 ug/kg/min via INTRAVENOUS

## 2024-01-08 MED ORDER — FENTANYL CITRATE (PF) 100 MCG/2ML IJ SOLN
INTRAMUSCULAR | Status: DC | PRN
Start: 2024-01-08 — End: 2024-01-08
  Administered 2024-01-08: 50 ug via INTRAVENOUS

## 2024-01-08 MED ORDER — CEFAZOLIN SODIUM-DEXTROSE 2-4 GM/100ML-% IV SOLN
INTRAVENOUS | Status: AC
  Filled 2024-01-08: qty 100

## 2024-01-08 MED ORDER — AMISULPRIDE (ANTIEMETIC) 5 MG/2ML IV SOLN: 10.0000 mg | Freq: Once | INTRAVENOUS | Status: DC | PRN

## 2024-01-08 MED ORDER — LIDOCAINE 2% (20 MG/ML) 5 ML SYRINGE
INTRAMUSCULAR | Status: AC
  Filled 2024-01-08: qty 15

## 2024-01-08 MED ORDER — OXYCODONE HCL 5 MG PO TABS: 5.0000 mg | ORAL_TABLET | Freq: Four times a day (QID) | ORAL | 0 refills | Status: AC | PRN

## 2024-01-08 MED ORDER — LACTATED RINGERS IV SOLN: INTRAVENOUS | Status: DC

## 2024-01-08 MED ORDER — CEFAZOLIN SODIUM-DEXTROSE 2-4 GM/100ML-% IV SOLN
2.0000 g | INTRAVENOUS | Status: AC
  Administered 2024-01-08: 2 g via INTRAVENOUS

## 2024-01-08 MED ORDER — DEXMEDETOMIDINE HCL IN NACL 80 MCG/20ML IV SOLN
INTRAVENOUS | Status: DC | PRN
  Administered 2024-01-08: 4 ug via INTRAVENOUS

## 2024-01-08 MED ORDER — MIDAZOLAM HCL 2 MG/2ML IJ SOLN
INTRAMUSCULAR | Status: AC
  Filled 2024-01-08: qty 2

## 2024-01-08 MED ORDER — LIDOCAINE 2% (20 MG/ML) 5 ML SYRINGE
INTRAMUSCULAR | Status: AC
  Filled 2024-01-08: qty 5

## 2024-01-08 SURGICAL SUPPLY — 26 items
BLADE SURG 15 STRL LF DISP TIS (BLADE) ×1 IMPLANT
BNDG ELASTIC 2INX 5YD STR LF (GAUZE/BANDAGES/DRESSINGS) ×1 IMPLANT
BNDG ESMARK 4X9 LF (GAUZE/BANDAGES/DRESSINGS) ×1 IMPLANT
CHLORAPREP W/TINT 26 (MISCELLANEOUS) ×1 IMPLANT
CORD BIPOLAR FORCEPS 12FT (ELECTRODE) ×1 IMPLANT
COVER BACK TABLE 60X90IN (DRAPES) ×1 IMPLANT
CUFF TOURN SGL QUICK 18X4 (TOURNIQUET CUFF) IMPLANT
CUFF TRNQT CYL 24X4X16.5-23 (TOURNIQUET CUFF) IMPLANT
DRAPE EXTREMITY T 121X128X90 (DISPOSABLE) ×1 IMPLANT
DRAPE SURG 17X23 STRL (DRAPES) ×1 IMPLANT
GAUZE SPONGE 4X4 12PLY STRL (GAUZE/BANDAGES/DRESSINGS) IMPLANT
GAUZE XEROFORM 1X8 LF (GAUZE/BANDAGES/DRESSINGS) ×1 IMPLANT
GLOVE BIO SURGEON STRL SZ7 (GLOVE) ×1 IMPLANT
GLOVE BIOGEL PI IND STRL 7.0 (GLOVE) ×1 IMPLANT
GOWN STRL REUS W/ TWL LRG LVL3 (GOWN DISPOSABLE) ×2 IMPLANT
NDL HYPO 25X1 1.5 SAFETY (NEEDLE) ×1 IMPLANT
NEEDLE HYPO 25X1 1.5 SAFETY (NEEDLE) ×1 IMPLANT
NS IRRIG 1000ML POUR BTL (IV SOLUTION) ×1 IMPLANT
PACK BASIN DAY SURGERY FS (CUSTOM PROCEDURE TRAY) ×1 IMPLANT
SHEET MEDIUM DRAPE 40X70 STRL (DRAPES) ×1 IMPLANT
SUT ETHILON 4 0 PS 2 18 (SUTURE) ×1 IMPLANT
SUT VIC AB 4-0 PS2 18 (SUTURE) IMPLANT
SYR BULB EAR ULCER 3OZ GRN STR (SYRINGE) ×1 IMPLANT
SYR CONTROL 10ML LL (SYRINGE) ×1 IMPLANT
TOWEL GREEN STERILE FF (TOWEL DISPOSABLE) ×1 IMPLANT
UNDERPAD 30X36 HEAVY ABSORB (UNDERPADS AND DIAPERS) ×1 IMPLANT

## 2024-01-08 NOTE — Anesthesia Preprocedure Evaluation (Addendum)
 Anesthesia Evaluation  Patient identified by MRN, date of birth, ID band Patient awake    Reviewed: Allergy & Precautions, NPO status , Patient's Chart, lab work & pertinent test results  History of Anesthesia Complications (+) PONV and history of anesthetic complications  Airway Mallampati: I  TM Distance: >3 FB Neck ROM: Full    Dental  (+) Dental Advisory Given, Teeth Intact   Pulmonary neg pulmonary ROS   Pulmonary exam normal breath sounds clear to auscultation       Cardiovascular negative cardio ROS Normal cardiovascular exam Rhythm:Regular Rate:Normal     Neuro/Psych  Headaches PSYCHIATRIC DISORDERS  Depression       GI/Hepatic negative GI ROS, Neg liver ROS,,,  Endo/Other  Hypothyroidism    Renal/GU negative Renal ROS  negative genitourinary   Musculoskeletal  (+) Arthritis ,    Abdominal   Peds  Hematology negative hematology ROS (+)   Anesthesia Other Findings   Reproductive/Obstetrics                             Anesthesia Physical Anesthesia Plan  ASA: 2  Anesthesia Plan: MAC   Post-op Pain Management: Tylenol PO (pre-op)*   Induction: Intravenous  PONV Risk Score and Plan: 3 and Propofol infusion, Treatment may vary due to age or medical condition, Midazolam and Ondansetron  Airway Management Planned: Natural Airway  Additional Equipment:   Intra-op Plan:   Post-operative Plan:   Informed Consent: I have reviewed the patients History and Physical, chart, labs and discussed the procedure including the risks, benefits and alternatives for the proposed anesthesia with the patient or authorized representative who has indicated his/her understanding and acceptance.     Dental advisory given  Plan Discussed with: CRNA  Anesthesia Plan Comments:        Anesthesia Quick Evaluation

## 2024-01-08 NOTE — Op Note (Signed)
   Date of Surgery: 01/08/2024  INDICATIONS: Patient is a 68 y.o.-year-old female with right middle finger stenosing tenosynovitis that has failed conservative management.  Risks, benefits, and alternatives to surgery were again discussed with the patient in the preoperative area. The patient wishes to proceed with surgery.  Informed consent was signed after our discussion.   PREOPERATIVE DIAGNOSIS:  Right middle finger stenosing tenosynovitis  POSTOPERATIVE DIAGNOSIS: Same.  PROCEDURE:  Right middle finger A1 pulley release   SURGEON: Waylan Rocher, M.D.  ASSIST:   ANESTHESIA:  Local, MAC  IV FLUIDS AND URINE: See anesthesia.  ESTIMATED BLOOD LOSS: <5 mL.  IMPLANTS: * No implants in log *   DRAINS: None  COMPLICATIONS: None  DESCRIPTION OF PROCEDURE: The patient was met in the preoperative holding area where the surgical site was marked and the informed consent form was signed.  The patient was then brought back to the operating room and remained on the stretcher.  A hand table was placed adjacent to the operative extremity and locked into place.  A tourniquet was placed on the right forearm.  A formal timeout was performed to confirm that this was the correct patient, surgical side, surgical site, and surgical procedure.  All were present and in agreement. Following formal timeout, a local block was performed using an equal mixture of 10 mL of 1% plain lidocaine and 0.25% plain marcaine.  The right upper extremity was then prepped and draped in the usual and sterile fashion.    The limb was exsanguinated and the tourniquet inflated to 250 mmHg.  A longitudinal incision was made over the A1 pulley.  The skin was incised.  Blunt dissection was used to identify the A1 pulley.  Two Ragnell retractors were placed on the radial and ulnar sides of the pulley to protect the respective neurovascular bundles.  A third Ragnell was placed at the distal aspect of the wound.  The A1 pulley was  clearly identified.  Under direct visualization, the pulley was entered sharply using a 15 blade.  Tenotomy scissors were used to complete the pulley release distally to the level of the A2 pulley.  The distal retractor was then placed in the proximal aspect of the wound.  Under direct visualization, the proximal aspect of the A1 pulley was completely released.   Following satisfactory A1 pulley release, the patient was reversed from sedation and asked to fully flex and extend the involved finger.  There was no catching or triggering present.  The tourniquet was let down and hemostasis achieved with direct pressure and bipolar electrocautery.  The wound was then thoroughly irrigated.  It was closed using 4-0 nylon sutures in a horizontal mattress fashion.  The wound was dressed with xeroform, 4x4, and an ace wrap.  All counts were correct x 2 at the end of the procedure.  The patient was then taken to the PACU in stable condition.   POSTOPERATIVE PLAN: She will be discharged to home with appropriate pain medication and discharge instructions.  I'll see her back in 10-14 days for her first postop visit.   Waylan Rocher, MD 11:10 AM

## 2024-01-08 NOTE — Discharge Instructions (Addendum)
 Stacey Kelly, M.D. Hand Surgery  POST-OPERATIVE DISCHARGE INSTRUCTIONS   PRESCRIPTIONS: - You may have been given a prescription to be taken as directed for post-operative pain control.  You may also take over the counter ibuprofen/aleve and tylenol for pain. Take this as directed on the packaging. Do not exceed 3000 mg tylenol/acetaminophen in 24 hours.  Ibuprofen 600-800 mg (3-4) tablets by mouth every 6 hours as needed for pain.   OR  Aleve 2 tablets by mouth every 12 hours (twice daily) as needed for pain.   AND/OR  Tylenol 1000 mg (2 tablets) every 8 hours as needed for pain.  - Please use your pain medication carefully, as refills are limited and you may not be provided with one.  As stated above, please use over the counter pain medicine - it will also be helpful with decreasing your swelling.    ANESTHESIA: -After your surgery, post-surgical discomfort or pain is likely. This discomfort can last several days to a few weeks. At certain times of the day your discomfort may be more intense.   Did you receive a nerve block?   - A nerve block can provide pain relief for one hour to two days after your surgery. As long as the nerve block is working, you will experience little or no sensation in the area the surgeon operated on.  - As the nerve block wears off, you will begin to experience pain or discomfort. It is very important that you begin taking your prescribed pain medication before the nerve block fully wears off. Treating your pain at the first sign of the block wearing off will ensure your pain is better controlled and more tolerable when full-sensation returns. Do not wait until the pain is intolerable, as the medicine will be less effective. It is better to treat pain in advance than to try and catch up.   General Anesthesia:  If you did not receive a nerve block during your surgery, you will need to start taking your pain medication shortly after your surgery and  should continue to do so as prescribed by your surgeon.     ICE AND ELEVATION: - You may use ice for the first 48-72 hours, but it is not critical.   - Motion of your fingers is very important to decrease the swelling.  - Elevation, as much as possible for the next 48 hours, is critical for decreasing swelling as well as for pain relief. Elevation means when you are seated or lying down, you hand should be at or above your heart. When walking, the hand needs to be at or above the level of your elbow.  - If the bandage gets too tight, it may need to be loosened. Please contact our office and we will instruct you in how to do this.    SURGICAL BANDAGES:  - Keep your dressing and/or splint clean and dry at all times.  You can remove your dressing 7 days from now and change with a dry dressing or Band-Aids as needed thereafter. - You may place a plastic bag over your bandage to shower, but be careful, do not get your bandages wet.  - After the bandages have been removed, it is OK to get the stitches wet in a shower or with hand washing. Do Not soak or submerge the wound yet. Please do not use lotions or creams on the stitches.      HAND THERAPY:  - You may not need any. If you   do, we will begin this at your follow up visit in the clinic.    ACTIVITY AND WORK: - You are encouraged to move any fingers which are not in the bandage.  - Light use of the fingers is allowed to assist the other hand with daily hygiene and eating, but strong gripping or lifting is often uncomfortable and should be avoided.  - You might miss a variable period of time from work and hopefully this issue has been discussed prior to surgery. You may not do any heavy work with your affected hand for about 2 weeks.    [facility] Second Floor, [address] , [place] [address] [phone]    Post Anesthesia Home Care Instructions  Activity: Get plenty of rest for the remainder of the day. A  responsible individual must stay with you for 24 hours following the procedure.  For the next 24 hours, DO NOT: -Drive a car -Operate machinery -Drink alcoholic beverages -Take any medication unless instructed by your physician -Make any legal decisions or sign important papers.  Meals: Start with liquid foods such as gelatin or soup. Progress to regular foods as tolerated. Avoid greasy, spicy, heavy foods. If nausea and/or vomiting occur, drink only clear liquids until the nausea and/or vomiting subsides. Call your physician if vomiting continues.  Special Instructions/Symptoms: Your throat may feel dry or sore from the anesthesia or the breathing tube placed in your throat during surgery. If this causes discomfort, gargle with warm salt water. The discomfort should disappear within 24 hours.     

## 2024-01-08 NOTE — Anesthesia Postprocedure Evaluation (Signed)
 Anesthesia Post Note  Patient: Stacey Kelly  Procedure(s) Performed: RELEASE TRIGGER MIDDLE FINGER/A-1 PULLEY (Right: Hand)     Patient location during evaluation: PACU Anesthesia Type: MAC Level of consciousness: awake and alert Pain management: pain level controlled Vital Signs Assessment: post-procedure vital signs reviewed and stable Respiratory status: spontaneous breathing, nonlabored ventilation, respiratory function stable and patient connected to nasal cannula oxygen Cardiovascular status: stable and blood pressure returned to baseline Postop Assessment: no apparent nausea or vomiting Anesthetic complications: no  No notable events documented.  Last Vitals:  Vitals:   01/08/24 1130 01/08/24 1146  BP: 112/60 128/65  Pulse: 64 72  Resp: 14   Temp:  36.7 C  SpO2: 95% 95%    Last Pain:  Vitals:   01/08/24 1146  TempSrc: Temporal  PainSc: 0-No pain                 Lateria Alderman L Karthikeya Funke

## 2024-01-08 NOTE — Interval H&P Note (Signed)
 History and Physical Interval Note:  01/08/2024 10:22 AM  Stacey Kelly  has presented today for surgery, with the diagnosis of Right middle finger trigger finger.  The various methods of treatment have been discussed with the patient and family. After consideration of risks, benefits and other options for treatment, the patient has consented to  Procedure(s) with comments: RELEASE TRIGGER MIDDLE FINGER/A-1 PULLEY (Right) - local as a surgical intervention.  The patient's history has been reviewed, patient examined, no change in status, stable for surgery.  I have reviewed the patient's chart and labs.  Questions were answered to the patient's satisfaction.     Danea Manter

## 2024-01-08 NOTE — Transfer of Care (Signed)
 Immediate Anesthesia Transfer of Care Note  Patient: Christyann A Klenke  Procedure(s) Performed: RELEASE TRIGGER MIDDLE FINGER/A-1 PULLEY (Right: Hand)  Patient Location: PACU  Anesthesia Type:MAC  Level of Consciousness: awake, alert , oriented, and patient cooperative  Airway & Oxygen Therapy: Patient Spontanous Breathing  Post-op Assessment: Report given to RN and Post -op Vital signs reviewed and stable  Post vital signs: Reviewed and stable  Last Vitals:  Vitals Value Taken Time  BP    Temp    Pulse 66 01/08/24 1113  Resp 14 01/08/24 1113  SpO2 94 % 01/08/24 1113    Last Pain:  Vitals:   01/08/24 0914  TempSrc: Oral  PainSc: 2       Patients Stated Pain Goal: 4 (01/08/24 0914)  Complications: No notable events documented.

## 2024-01-08 NOTE — H&P (Signed)
 HAND SURGERY   HPI: Patient is a 68 y.o. female who presents with right middle finger stenosing tenosynovitis that has failed conservative management.  Patient denies any changes to their medical history or new systemic symptoms today.    Past Medical History:  Diagnosis Date   Depression    Heart murmur    ASYMPTOMATIC--  ECHO  1995  MILD REGURG   Migraine    OA (osteoarthritis)    shoulders   PONV (postoperative nausea and vomiting)    Rotator cuff tear, right    Vitamin D deficiency    Wears glasses    Past Surgical History:  Procedure Laterality Date   BUNIONECTOMY Right 2004   BOTH SIDES OF FOOT   CRYOTHERAPY     HARDWARE REMOVAL Right 04/15/2014   Procedure: EXCISION OF RIGHT FOOT DORSAL HARDWARE;  Surgeon: Toni Arthurs, MD;  Location: [facility];  Service: Orthopedics;  Laterality: Right;   RIGHT SHOULDER SURGERY  X3  LAST ONE 2010   INCLUDING ROTATOR CUFF REPAIR /  RECONSTRUCTION   SHOULDER ARTHROSCOPY WITH ROTATOR CUFF REPAIR Left 03/18/2014   Procedure: LEFT SHOULDER ARTHROSCOPY WITH EVALUATION UNDER ANESTHESIA DEBRIDEMENT SUBACROMIAL DECOMPRESSION DISTAL CLAVICAL RESECTION ROTATOR CUFF REPAIR BICEPS  TENDINOSIS;  Surgeon: Eugenia Mcalpine, MD;  Location: [facility];  Service: Orthopedics;  Laterality: Left;   SHOULDER ARTHROSCOPY WITH ROTATOR CUFF REPAIR Right 06/23/2019   Procedure: SHOULDER ARTHROSCOPY WITH ROTATOR CUFF REPAIR , biceps tenotomy;  Surgeon: Eugenia Mcalpine, MD;  Location: [facility];  Service: Orthopedics;  Laterality: Right;   Tonail surg Right    TONSILLECTOMY  AGE 63   Social History   Socioeconomic History   Marital status: Married    Spouse name: Not on file   Number of children: Not on file   Years of education: Not on file   Highest education level: Not on file  Occupational History   Not on file  Tobacco Use   Smoking status: Never   Smokeless tobacco: Never  Vaping Use   Vaping status:  Never Used  Substance and Sexual Activity   Alcohol use: Not Currently   Drug use: Never   Sexual activity: Not Currently    Partners: Male    Birth control/protection: Post-menopausal    Comment: intercourse age 37, more than 5 sexual partners, MARRIED- 28 YRS   Other Topics Concern   Not on file  Social History Narrative   Not on file   Social Drivers of Health   Financial Resource Strain: Not on file  Food Insecurity: Not on file  Transportation Needs: Not on file  Physical Activity: Not on file  Stress: Not on file  Social Connections: Not on file   Family History  Problem Relation Age of Onset   Pulmonary embolism Mother    Heart disease Father    Cancer Sister    - negative except otherwise stated in the family history section Allergies  Allergen Reactions   Latex Anaphylaxis   Prior to Admission medications   Medication Sig Start Date End Date Taking? Authorizing Provider  Ascorbic Acid (VITAMIN C) 1000 MG tablet Take 1,000 mg by mouth daily.   Yes [provider]  Biotin 16109 MCG TABS 1 tablet   Yes [provider]  Calcium Carb-Cholecalciferol (CALCIUM 600 + D PO) Take 1 tablet by mouth 2 (two) times daily.   Yes [provider]  cetirizine (ZYRTEC) 10 MG tablet Take 10 mg by mouth every evening.  Yes [provider]  Cholecalciferol (VITAMIN D3) 2000 UNITS TABS Take 1 capsule by mouth daily.   Yes [provider]  Cyanocobalamin (B-12 PO) Take by mouth.   Yes [provider]  fluticasone (FLONASE) 50 MCG/ACT nasal spray Place 1 spray into both nostrils at bedtime.    Yes [provider]  levothyroxine (SYNTHROID) 75 MCG tablet Take 1 tablet (75 mcg total) by mouth in the morning on an empty stomach. *Need office visit.* Patient taking differently: Take 88 mcg by mouth in the morning. 08/19/23  Yes   Multiple Vitamin (MULTIVITAMIN) tablet Take 1 tablet by mouth daily.   Yes [provider]   PSYLLIUM PO Take by mouth.   Yes [provider]  Red Yeast Rice 600 MG CAPS Take by mouth.   Yes [provider]  Zinc 50 MG TABS Take by mouth daily.   Yes [provider]  atorvastatin (LIPITOR) 10 MG tablet Take 1 tablet (10 mg total) by mouth daily. 05/07/23     EPINEPHrine (EPIPEN 2-PAK) 0.3 mg/0.3 mL IJ SOAJ injection Use as directed as needed 04/26/22     EPINEPHrine 0.3 mg/0.3 mL IJ SOAJ injection Inject as needed as directed Patient not taking: Reported on 11/21/2022 03/10/21     erythromycin ophthalmic ointment Place into the lower eyelid of affected eye four times a day 7 days. 02/18/23     levothyroxine (SYNTHROID) 50 MCG tablet Take 1 tablet (50 mcg total) by mouth in the morning on an empty stomach. 05/01/23     levothyroxine (SYNTHROID) 75 MCG tablet Take 1 tablet (75 mcg total) by mouth daily. 06/21/23      No results found. - Positive ROS: All other systems have been reviewed and were otherwise negative with the exception of those mentioned in the HPI and as above.  Physical Exam: General: No acute distress, resting comfortably Cardiovascular: BUE warm and well perfused, normal rate Respiratory: Normal WOB on RA Skin: Warm and dry Neurologic: Sensation intact distally Psychiatric: Patient is at baseline mood and affect  Right Upper Extremity  TTP over middle finger A1 pulley.  Palpable locking/catching with A/PROM of middle finger.  Full and painless AROM of remaining fingers without locking or catching.  SILT m/u/r distributions.  Hand warm and well perfused w/ BCR.    Assessment: 68 yo F w/ right middle finger stenosing tenosynovitis.   Plan: OR today for right middle finger A1 pulley release. We again reviewed the risks of surgery which include bleeding, infection, damage to neurovascular structures, persistent symptoms, delayed wound healing, need for additional surgery.  Informed consent was signed.  All questions were answered.   Stacey Kelly, M.D. [facility] 10:21 AM

## 2024-01-09 ENCOUNTER — Encounter (HOSPITAL_BASED_OUTPATIENT_CLINIC_OR_DEPARTMENT_OTHER): Payer: Self-pay | Admitting: Orthopedic Surgery

## 2024-08-28 ENCOUNTER — Other Ambulatory Visit: Payer: Self-pay

## 2024-08-28 ENCOUNTER — Encounter (HOSPITAL_BASED_OUTPATIENT_CLINIC_OR_DEPARTMENT_OTHER): Payer: Self-pay | Admitting: Orthopedic Surgery

## 2024-09-04 ENCOUNTER — Ambulatory Visit (HOSPITAL_BASED_OUTPATIENT_CLINIC_OR_DEPARTMENT_OTHER): Admitting: Anesthesiology

## 2024-09-04 ENCOUNTER — Encounter (HOSPITAL_BASED_OUTPATIENT_CLINIC_OR_DEPARTMENT_OTHER): Admission: RE | Disposition: A | Payer: Self-pay | Source: Home / Self Care | Attending: Orthopedic Surgery

## 2024-09-04 ENCOUNTER — Ambulatory Visit (HOSPITAL_BASED_OUTPATIENT_CLINIC_OR_DEPARTMENT_OTHER)
Admission: RE | Admit: 2024-09-04 | Discharge: 2024-09-04 | Disposition: A | Discharge status: Home / Self Care | Attending: Orthopedic Surgery | Admitting: Orthopedic Surgery

## 2024-09-04 ENCOUNTER — Encounter (HOSPITAL_BASED_OUTPATIENT_CLINIC_OR_DEPARTMENT_OTHER): Payer: Self-pay | Admitting: Orthopedic Surgery

## 2024-09-04 DIAGNOSIS — S83282A Other tear of lateral meniscus, current injury, left knee, initial encounter: Secondary | ICD-10-CM

## 2024-09-04 DIAGNOSIS — M94262 Chondromalacia, left knee: Secondary | ICD-10-CM | POA: Diagnosis not present

## 2024-09-04 DIAGNOSIS — I340 Nonrheumatic mitral (valve) insufficiency: Secondary | ICD-10-CM | POA: Insufficient documentation

## 2024-09-04 DIAGNOSIS — E039 Hypothyroidism, unspecified: Secondary | ICD-10-CM | POA: Diagnosis not present

## 2024-09-04 DIAGNOSIS — X58XXXA Exposure to other specified factors, initial encounter: Secondary | ICD-10-CM | POA: Diagnosis not present

## 2024-09-04 HISTORY — DX: Hypothyroidism, unspecified: E03.9

## 2024-09-04 HISTORY — PX: KNEE ARTHROSCOPY WITH LATERAL MENISECTOMY: SHX6193

## 2024-09-04 SURGERY — ARTHROSCOPY, KNEE, WITH LATERAL MENISCECTOMY
Anesthesia: General | Site: Knee | Laterality: Left

## 2024-09-04 MED ORDER — ONDANSETRON 4 MG PO TBDP: 4.0000 mg | ORAL_TABLET | Freq: Three times a day (TID) | ORAL | 0 refills | Status: AC | PRN

## 2024-09-04 MED ORDER — DEXAMETHASONE SOD PHOSPHATE PF 10 MG/ML IJ SOLN
INTRAMUSCULAR | Status: DC | PRN
  Administered 2024-09-04: 5 mg via INTRAVENOUS

## 2024-09-04 MED ORDER — ONDANSETRON HCL 4 MG/2ML IJ SOLN
INTRAMUSCULAR | Status: DC | PRN
  Administered 2024-09-04: 4 mg via INTRAVENOUS

## 2024-09-04 MED ORDER — OXYCODONE HCL 5 MG PO TABS: 5.0000 mg | ORAL_TABLET | Freq: Once | ORAL | Status: DC | PRN

## 2024-09-04 MED ORDER — ONDANSETRON HCL 4 MG/2ML IJ SOLN
INTRAMUSCULAR | Status: AC
  Filled 2024-09-04: qty 2

## 2024-09-04 MED ORDER — HYDROCODONE-ACETAMINOPHEN 5-325 MG PO TABS: 1.0000 | ORAL_TABLET | Freq: Four times a day (QID) | ORAL | 0 refills | Status: AC | PRN

## 2024-09-04 MED ORDER — ONDANSETRON HCL 4 MG/2ML IJ SOLN: 4.0000 mg | Freq: Once | INTRAMUSCULAR | Status: DC | PRN

## 2024-09-04 MED ORDER — BUPIVACAINE HCL 0.25 % IJ SOLN
INTRAMUSCULAR | Status: DC | PRN
Start: 2024-09-04 — End: 2024-09-04
  Administered 2024-09-04: 16 mL

## 2024-09-04 MED ORDER — PROPOFOL 500 MG/50ML IV EMUL
INTRAVENOUS | Status: AC
  Filled 2024-09-04: qty 50

## 2024-09-04 MED ORDER — ACETAMINOPHEN 10 MG/ML IV SOLN
1000.0000 mg | Freq: Once | INTRAVENOUS | Status: DC | PRN
  Administered 2024-09-04: 1000 mg via INTRAVENOUS

## 2024-09-04 MED ORDER — MIDAZOLAM HCL 2 MG/2ML IJ SOLN
INTRAMUSCULAR | Status: AC
Start: 2024-09-04 — End: 2024-09-04
  Filled 2024-09-04: qty 2

## 2024-09-04 MED ORDER — LIDOCAINE 2% (20 MG/ML) 5 ML SYRINGE
INTRAMUSCULAR | Status: DC | PRN
  Administered 2024-09-04: 100 mg via INTRAVENOUS

## 2024-09-04 MED ORDER — FENTANYL CITRATE (PF) 100 MCG/2ML IJ SOLN
INTRAMUSCULAR | Status: AC
  Filled 2024-09-04: qty 2

## 2024-09-04 MED ORDER — OXYCODONE HCL 5 MG/5ML PO SOLN: 5.0000 mg | Freq: Once | ORAL | Status: DC | PRN

## 2024-09-04 MED ORDER — FENTANYL CITRATE (PF) 100 MCG/2ML IJ SOLN
INTRAMUSCULAR | Status: DC | PRN
  Administered 2024-09-04 (×2): 50 ug via INTRAVENOUS
  Administered 2024-09-04: 25 ug via INTRAVENOUS

## 2024-09-04 MED ORDER — ACETAMINOPHEN 10 MG/ML IV SOLN
INTRAVENOUS | Status: AC
Start: 2024-09-04 — End: 2024-09-04
  Filled 2024-09-04: qty 100

## 2024-09-04 MED ORDER — MIDAZOLAM HCL (PF) 2 MG/2ML IJ SOLN
INTRAMUSCULAR | Status: DC | PRN
  Administered 2024-09-04 (×2): 1 mg via INTRAVENOUS

## 2024-09-04 MED ORDER — CEFAZOLIN SODIUM-DEXTROSE 2-4 GM/100ML-% IV SOLN
INTRAVENOUS | Status: AC
Start: 2024-09-04 — End: 2024-09-04
  Filled 2024-09-04: qty 100

## 2024-09-04 MED ORDER — FENTANYL CITRATE (PF) 100 MCG/2ML IJ SOLN
25.0000 ug | INTRAMUSCULAR | Status: DC | PRN
  Administered 2024-09-04: 50 ug via INTRAVENOUS

## 2024-09-04 MED ORDER — LACTATED RINGERS IV SOLN
INTRAVENOUS | Status: DC
Start: 2024-09-04 — End: 2024-09-04

## 2024-09-04 MED ORDER — SODIUM CHLORIDE (PF) 0.9 % IJ SOLN
INTRAMUSCULAR | Status: DC | PRN
  Administered 2024-09-04: 1500 mL

## 2024-09-04 MED ORDER — PROPOFOL 10 MG/ML IV BOLUS
INTRAVENOUS | Status: DC | PRN
  Administered 2024-09-04: 200 ug/kg/min via INTRAVENOUS
  Administered 2024-09-04: 140 mg via INTRAVENOUS

## 2024-09-04 MED ORDER — CEFAZOLIN SODIUM-DEXTROSE 2-4 GM/100ML-% IV SOLN
2.0000 g | INTRAVENOUS | Status: AC
  Administered 2024-09-04: 2 g via INTRAVENOUS

## 2024-09-04 SURGICAL SUPPLY — 21 items
BLADE SHAVER TORPEDO 4X13 (MISCELLANEOUS) ×1 IMPLANT
BNDG ELASTIC 6INX 5YD STR LF (GAUZE/BANDAGES/DRESSINGS) ×1 IMPLANT
CLSR STERI-STRIP ANTIMIC 1/2X4 (GAUZE/BANDAGES/DRESSINGS) ×1 IMPLANT
DRAPE U-SHAPE 47X51 STRL (DRAPES) ×1 IMPLANT
DRAPE-T ARTHROSCOPY W/POUCH (DRAPES) ×1 IMPLANT
DURAPREP 26ML APPLICATOR (WOUND CARE) ×1 IMPLANT
GAUZE PAD ABD 8X10 STRL (GAUZE/BANDAGES/DRESSINGS) ×1 IMPLANT
GAUZE SPONGE 4X4 12PLY STRL (GAUZE/BANDAGES/DRESSINGS) ×1 IMPLANT
GLOVE BIO SURGEON STRL SZ7.5 (GLOVE) ×2 IMPLANT
GLOVE BIOGEL PI IND STRL 8 (GLOVE) ×2 IMPLANT
GOWN STRL REUS W/ TWL LRG LVL3 (GOWN DISPOSABLE) ×1 IMPLANT
GOWN STRL REUS W/TWL XL LVL3 (GOWN DISPOSABLE) ×2 IMPLANT
MANIFOLD NEPTUNE II (INSTRUMENTS) ×1 IMPLANT
PACK ARTHROSCOPY DSU (CUSTOM PROCEDURE TRAY) ×1 IMPLANT
PACK BASIN DAY SURGERY FS (CUSTOM PROCEDURE TRAY) ×1 IMPLANT
SUT ETHILON 4 0 PS 2 18 (SUTURE) IMPLANT
SUT MNCRL AB 3-0 PS2 18 (SUTURE) ×1 IMPLANT
TOWEL GREEN STERILE FF (TOWEL DISPOSABLE) ×1 IMPLANT
TUBE CONNECTING 20X1/4 (TUBING) IMPLANT
TUBING ARTHROSCOPY IRRIG 16FT (MISCELLANEOUS) ×1 IMPLANT
WAND ABLATOR APOLLO I90 (BUR) IMPLANT

## 2024-09-04 NOTE — Brief Op Note (Signed)
 09/04/2024  11:34 AM  PATIENT:  Stacey Kelly  68 y.o. female  PRE-OPERATIVE DIAGNOSIS:  Left knee lateral meniscus tear  POST-OPERATIVE DIAGNOSIS:  Left knee lateral meniscus tear  PROCEDURE:  Procedure(s): ARTHROSCOPY, KNEE, WITH LATERAL MENISCECTOMY (Left)  SURGEON:  Surgeons and Role:    * Sharl Selinda Dover, MD - Primary  PHYSICIAN ASSISTANT: Dayle Moores, PA-C   ANESTHESIA:   local and general  EBL:   5  cc  BLOOD ADMINISTERED:none  DRAINS: none   LOCAL MEDICATIONS USED:  MARCAINE      SPECIMEN:  No Specimen  DISPOSITION OF SPECIMEN:  N/A  COUNTS:  YES  TOURNIQUET:  * No tourniquets in log *  DICTATION: .Note written in EPIC  PLAN OF CARE: Discharge to home after PACU  PATIENT DISPOSITION:  PACU - hemodynamically stable.   Delay start of Pharmacological VTE agent (>24hrs) due to surgical blood loss or risk of bleeding: not applicable

## 2024-09-04 NOTE — Op Note (Signed)
 Surgery Date: 09/04/2024  Surgeon(s): Stacey Selinda Dover, MD  Assistant: Stacey Moores, PA-C  Assistant attestation:  PA Mcclung scrubbed and present for the entire procedure.  ANESTHESIA:  general, 17 cc quarter percent plain Marcaine   FLUIDS: Per anesthesia record.   ESTIMATED BLOOD LOSS: minimal  PREOPERATIVE DIAGNOSES:  1.  Left knee lateral meniscus tear 2.  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  Left knee grade III chondromalacia lateral femoral condyle extension surface  POSTOPERATIVE DIAGNOSES:  same  PROCEDURES PERFORMED:  1.  Left knee arthroscopy with limited synovectomy 2.  Left knee arthroscopy with arthroscopic partial lateral meniscectomy 3.  Left knee arthroscopy with arthroscopic chondroplasty medial femoral condyle and lateral femoral condyle  DESCRIPTION OF PROCEDURE: Stacey Kelly is a 68 y.o.-year-old female with a left knee unstable lateral meniscus tear. Plans are to proceed with partial lateral meniscectomy and diagnostic arthroscopy with debridement as indicated. Full discussion held regarding risks benefits alternatives and complications related surgical intervention. Conservative care options reviewed. All questions answered.  The patient was identified in the preoperative holding area and the operative extremity was marked. The patient was brought to the operating room and transferred to operating table in a supine position. Satisfactory general anesthesia was induced by anesthesiology.    Standard anterolateral, anteromedial arthroscopy portals were obtained. The anteromedial portal was obtained with a spinal needle for localization under direct visualization with subsequent diagnostic findings.   Anteromedial and anterolateral chambers: mild synovitis. The synovitis was debrided with a 4.5 mm full radius shaver through both the anteromedial and lateral portals.   Suprapatellar pouch and gutters: moderate synovitis or  debris. Patella chondral surface: Grade 1 Trochlear chondral surface: Grade 2 Patellofemoral tracking: Midline and level Medial meniscus: Intact without tear.  Medial femoral condyle flexion bearing surface: Grade 4 on the most lateral aspect of the flexion surface of the medial femoral condyle with a flap that was hinged. Medial femoral condyle extension bearing surface: Grade 0 Medial tibial plateau: Grade 0 Anterior cruciate ligament:stable Posterior cruciate ligament:stable Lateral meniscus: Complex tearing of the anterior horn with vertical components from the free edge back to the red-white zone.  Also, on the posterior horn adjacent to the root there was a short radial tear.  However, the posterior root was intact..   Lateral femoral condyle flexion bearing surface: Grade 0 Lateral femoral condyle extension bearing surface: Grade 3 Lateral tibial plateau: Grade 0  Chondroplasty carried out on the medial femoral condyle as well as lateral femoral condyle with motorized shaver.  We debulked the chondral flap on the medial femoral condyle with a hinge left intact.  This was stable when probed.  Likewise, we used a motorized shaver to stabilize the lateral femoral condyle grade III chondromalacia.  Next we moved ahead with the partial lateral meniscectomy.  The short radial tear in the posterior horn was taken back to a stable border with combination of meniscal biter and motorized shaver.  Next we turned our attention to the anterior horn vertical tear lateral meniscus.  This was debrided with motorized shaver all the way back to the capsule as this was very poor tissue quality with significant deterioration.  After completion of synovectomy, diagnostic exam, and debridements as described, all compartments were checked and no residual debris remained. Hemostasis was achieved with the cautery wand. The portals were approximated with buried monocryl. All excess fluid was expressed from the  joint.  Xeroform sterile gauze dressings were applied followed by Ace bandage and ice pack.  DISPOSITION: The patient was awakened from general anesthetic, extubated, taken to the recovery room in medically stable condition, no apparent complications. The patient may be weightbearing as tolerated to the operative lower extremity.  Range of motion of the knee as tolerated.

## 2024-09-04 NOTE — Anesthesia Postprocedure Evaluation (Signed)
 Anesthesia Post Note  Patient: Zissel A Picou  Procedure(s) Performed: ARTHROSCOPY, KNEE, WITH LATERAL MENISCECTOMY (Left: Knee)     Patient location during evaluation: PACU Anesthesia Type: General Level of consciousness: awake and alert Pain management: pain level controlled Vital Signs Assessment: post-procedure vital signs reviewed and stable Respiratory status: spontaneous breathing, nonlabored ventilation, respiratory function stable and patient connected to nasal cannula oxygen Cardiovascular status: blood pressure returned to baseline and stable Postop Assessment: no apparent nausea or vomiting Anesthetic complications: no   No notable events documented.  Last Vitals:  Vitals:   09/04/24 1230 09/04/24 1245  BP:  (!) 153/72  Pulse: 73 74  Resp: 13 15  Temp:  36.5 C  SpO2: 95% 95%    Last Pain:  Vitals:   09/04/24 1245  PainSc: 2                  Lynwood MARLA Cornea

## 2024-09-04 NOTE — Anesthesia Procedure Notes (Signed)
 Procedure Name: LMA Insertion Date/Time: 09/04/2024 11:08 AM  Performed by: Delayne Olam BIRCH, CRNAPre-anesthesia Checklist: Patient identified, Emergency Drugs available, Suction available and Patient being monitored Patient Re-evaluated:Patient Re-evaluated prior to induction Oxygen Delivery Method: Circle system utilized Preoxygenation: Pre-oxygenation with 100% oxygen Induction Type: IV induction Ventilation: Mask ventilation without difficulty LMA: LMA inserted LMA Size: 4.0 Number of attempts: 1 Airway Equipment and Method: Bite block Placement Confirmation: positive ETCO2 Tube secured with: Tape Dental Injury: Teeth and Oropharynx as per pre-operative assessment

## 2024-09-04 NOTE — Transfer of Care (Signed)
 Immediate Anesthesia Transfer of Care Note  Patient: Stacey Kelly  Procedure(s) Performed: Procedure(s) (LRB): ARTHROSCOPY, KNEE, WITH LATERAL MENISCECTOMY (Left)  Patient Location: PACU  Anesthesia Type: General  Level of Consciousness: awake, oriented, sedated and patient cooperative  Airway & Oxygen Therapy: Patient Spontanous Breathing and Patient connected to [place] oxygen  Post-op Assessment: Report given to PACU RN and Post -op Vital signs reviewed and stable  Post vital signs: Reviewed and stable  Complications: No apparent anesthesia complications  Last Vitals:  Vitals Value Taken Time  BP    Temp    Pulse 87 09/04/24 11:48  Resp 18 09/04/24 11:48  SpO2 98 % 09/04/24 11:48  Vitals shown include unfiled device data.  Last Pain:  Vitals:   09/04/24 0920  PainSc: 0-No pain      Patients Stated Pain Goal: 3 (09/04/24 0920)  Complications: No notable events documented.

## 2024-09-04 NOTE — Discharge Instructions (Addendum)
 Post-operative patient instructions  Knee Arthroscopy   Ice:  Place intermittent ice or cooler pack over your knee, 30 minutes on and 30 minutes off.  Continue this for the first 72 hours after surgery, then save ice for use after therapy sessions or on more active days.   Weight:  You may bear weight on your leg as your symptoms allow. DVT prevention: Perform ankle pumps as able throughout the day on the operative extremity.  Be mobile as possible with ambulation as able.  You should also take an 81 mg aspirin once per day x6 weeks. Crutches:  Use crutches (or walker) to assist in walking until told to discontinue by your physical therapist or physician. This will help to reduce pain.  Motion:  Perform gentle knee motion as tolerated - this is gentle bending and straightening of the knee. Seated heel slides: you can start by sitting in a chair, remove your brace, and gently slide your heel back on the floor - allowing your knee to bend. Have someone help you straighten your knee (or use your other leg/foot hooked under your ankle.  Dressing:  Perform 1st dressing change at 3 days postoperative. A moderate amount of blood tinged drainage is to be expected.  So if you bleed through the dressing on the first or second day or if you have fevers, it is fine to change the dressing/check the wounds early and redress wound. Elevate your leg.  If it bleeds through again, or if the incisions are leaking frank blood, please call the office. May change dressing every 1-2 days thereafter to help watch wounds. Can purchase Tegderm (or 21M Nexcare) water resistant dressings at local pharmacy / [employer]. Shower:  shower is ok after 3 days.  Please take shower, NO bath. Recover with gauze and ace wrap to help keep wounds protected.   Pain medication:  A narcotic pain medication has been prescribed.  Take as directed.  Typically you need narcotic pain medication more regularly during the first 3 to 5 days after surgery.   Decrease your use of the medication as the pain improves.  Narcotics can sometimes cause constipation, even after a few doses.  If you have problems with constipation, you can take an over the counter stool softener or light laxative.  If you have persistent problems, please notify your physician's office. Physical therapy: Additional activity guidelines to be provided by your physician or physical therapist at follow-up visits.  Driving: Do not recommend driving x 1-2 weeks post surgical, especially if surgery performed on right side. Should not drive while taking narcotic pain medications. It typically takes at least 2 weeks to restore sufficient neuromuscular function for normal reaction times for driving safety.  Call [phone] for questions or problems. Evenings you will be forwarded to the hospital operator.  Ask for the orthopaedic physician on call. Please call if you experience:    Redness, foul smelling, or persistent drainage from the surgical site  worsening knee pain and swelling not responsive to medication  any calf pain and or swelling of the lower leg  temperatures greater than 101.5 F other questions or concerns   Thank you for allowing us  to be a part of your care    Post Anesthesia Home Care Instructions  Activity: Get plenty of rest for the remainder of the day. A responsible individual must stay with you for 24 hours following the procedure.  For the next 24 hours, DO NOT: -Drive a car -Advertising copywriter -Drink alcoholic  beverages -Take any medication unless instructed by your physician -Make any legal decisions or sign important papers.  Meals: Start with liquid foods such as gelatin or soup. Progress to regular foods as tolerated. Avoid greasy, spicy, heavy foods. If nausea and/or vomiting occur, drink only clear liquids until the nausea and/or vomiting subsides. Call your physician if vomiting continues.  Special Instructions/Symptoms: Your throat may feel  dry or sore from the anesthesia or the breathing tube placed in your throat during surgery. If this causes discomfort, gargle with warm salt water. The discomfort should disappear within 24 hours.  If you had a scopolamine  patch placed behind your ear for the management of post- operative nausea and/or vomiting:  1. The medication in the patch is effective for 72 hours, after which it should be removed.  Wrap patch in a tissue and discard in the trash. Wash hands thoroughly with soap and water. 2. You may remove the patch earlier than 72 hours if you experience unpleasant side effects which may include dry mouth, dizziness or visual disturbances. 3. Avoid touching the patch. Wash your hands with soap and water after contact with the patch.     Next dose of Tylenol  may be given at 6:05pm if needed.

## 2024-09-04 NOTE — Anesthesia Preprocedure Evaluation (Signed)
 Anesthesia Evaluation  Patient identified by MRN, date of birth, ID band Patient awake    Reviewed: Allergy & Precautions, NPO status , Patient's Chart, lab work & pertinent test results, reviewed documented beta blocker date and time   History of Anesthesia Complications (+) PONV and history of anesthetic complications  Airway Mallampati: I  TM Distance: >3 FB     Dental no notable dental hx. (+) Caps   Pulmonary neg COPD   breath sounds clear to auscultation       Cardiovascular (-) angina (-) CAD and (-) Past MI + Valvular Problems/Murmurs  Rhythm:Regular Rate:Normal     Neuro/Psych  Headaches, neg Seizures PSYCHIATRIC DISORDERS  Depression       GI/Hepatic ,,,(+) neg Cirrhosis        Endo/Other  Hypothyroidism    Renal/GU Renal disease     Musculoskeletal  (+) Arthritis ,    Abdominal   Peds  Hematology   Anesthesia Other Findings   Reproductive/Obstetrics                              Anesthesia Physical Anesthesia Plan  ASA: 2  Anesthesia Plan: General   Post-op Pain Management:    Induction:   PONV Risk Score and Plan: 3 and Ondansetron , Propofol  infusion and TIVA  Airway Management Planned: LMA  Additional Equipment:   Intra-op Plan:   Post-operative Plan:   Informed Consent: I have reviewed the patients History and Physical, chart, labs and discussed the procedure including the risks, benefits and alternatives for the proposed anesthesia with the patient or authorized representative who has indicated his/her understanding and acceptance.     Dental advisory given  Plan Discussed with: CRNA  Anesthesia Plan Comments:         Anesthesia Quick Evaluation

## 2024-09-04 NOTE — H&P (Signed)
 ORTHOPAEDIC H and P  REQUESTING PHYSICIAN: Sharl Selinda Dover, MD  PCP:  Cleotilde Planas, MD  Chief Complaint: Left knee pain  HPI: Stacey Kelly is a 68 y.o. female who complains of left knee pain including mechanical symptoms.  Here today for arthroscopic assisted surgery.  No new complaints.  Past Medical History:  Diagnosis Date   Depression    Heart murmur    ASYMPTOMATIC--  ECHO  1995  MILD REGURG   Hypothyroidism    Migraine    OA (osteoarthritis)    shoulders   PONV (postoperative nausea and vomiting)    Rotator cuff tear, right    Vitamin D deficiency    Wears glasses    Past Surgical History:  Procedure Laterality Date   BUNIONECTOMY Right 2004   BOTH SIDES OF FOOT   CRYOTHERAPY     HARDWARE REMOVAL Right 04/15/2014   Procedure: EXCISION OF RIGHT FOOT DORSAL HARDWARE;  Surgeon: Norleen Armor, MD;  Location: [facility];  Service: Orthopedics;  Laterality: Right;   RIGHT SHOULDER SURGERY  X3  LAST ONE 2010   INCLUDING ROTATOR CUFF REPAIR /  RECONSTRUCTION   SHOULDER ARTHROSCOPY WITH ROTATOR CUFF REPAIR Left 03/18/2014   Procedure: LEFT SHOULDER ARTHROSCOPY WITH EVALUATION UNDER ANESTHESIA DEBRIDEMENT SUBACROMIAL DECOMPRESSION DISTAL CLAVICAL RESECTION ROTATOR CUFF REPAIR BICEPS  TENDINOSIS;  Surgeon: Lamar Collet, MD;  Location: [facility];  Service: Orthopedics;  Laterality: Left;   SHOULDER ARTHROSCOPY WITH ROTATOR CUFF REPAIR Right 06/23/2019   Procedure: SHOULDER ARTHROSCOPY WITH ROTATOR CUFF REPAIR , biceps tenotomy;  Surgeon: Collet Lamar, MD;  Location: [facility];  Service: Orthopedics;  Laterality: Right;   Tonail surg Right    TONSILLECTOMY  AGE 51   TRIGGER FINGER RELEASE Right 01/08/2024   Procedure: RELEASE TRIGGER MIDDLE FINGER/A-1 PULLEY;  Surgeon: Romona Harari, MD;  Location: [facility];  Service: Orthopedics;  Laterality: Right;  local   Social History   Socioeconomic History    Marital status: Married    Spouse name: Not on file   Number of children: Not on file   Years of education: Not on file   Highest education level: Not on file  Occupational History   Not on file  Tobacco Use   Smoking status: Never   Smokeless tobacco: Never  Vaping Use   Vaping status: Never Used  Substance and Sexual Activity   Alcohol use: Not Currently   Drug use: Never   Sexual activity: Not Currently    Partners: Male    Birth control/protection: Post-menopausal    Comment: intercourse age 76, more than 5 sexual partners, MARRIED- 27 YRS   Other Topics Concern   Not on file  Social History Narrative   Not on file   Social Drivers of Health   Financial Resource Strain: Not on file  Food Insecurity: Not on file  Transportation Needs: Not on file  Physical Activity: Not on file  Stress: Not on file  Social Connections: Not on file   Family History  Problem Relation Age of Onset   Pulmonary embolism Mother    Heart disease Father    Cancer Sister    Allergies  Allergen Reactions   Latex Anaphylaxis   Prior to Admission medications   Medication Sig Start Date End Date Taking? Authorizing Provider  Ascorbic Acid (VITAMIN C) 1000 MG tablet Take 1,000 mg by mouth daily.   Yes [provider]  Biotin 89999 MCG TABS 1 tablet  Yes [provider]  Calcium  Carb-Cholecalciferol (CALCIUM  600 + D PO) Take 1 tablet by mouth 2 (two) times daily.   Yes [provider]  cetirizine (ZYRTEC) 10 MG tablet Take 10 mg by mouth every evening.   Yes [provider]  Cholecalciferol (VITAMIN D3) 2000 UNITS TABS Take 1 capsule by mouth daily.   Yes [provider]  Cyanocobalamin (B-12 PO) Take by mouth.   Yes [provider]  docusate sodium (COLACE) 250 MG capsule Take 250 mg by mouth daily.   Yes [provider]  erythromycin  ophthalmic ointment Place into the lower eyelid of affected eye four times a day 7 days.  02/18/23  Yes   fluticasone (FLONASE) 50 MCG/ACT nasal spray Place 1 spray into both nostrils at bedtime.    Yes [provider]  L-Lysine 1000 MG TABS Take by mouth.   Yes [provider]  levothyroxine  (SYNTHROID ) 100 MCG tablet Take 100 mcg by mouth daily before breakfast.   Yes [provider]  MAGNESIUM GLYCINATE ADVANCED PO Take by mouth.   Yes [provider]  Multiple Vitamin (MULTIVITAMIN) tablet Take 1 tablet by mouth daily.   Yes [provider]  Psyllium 400 MG CAPS Take by mouth.   Yes [provider]  Red Yeast Rice 600 MG CAPS Take by mouth.   Yes [provider]  sennosides-docusate sodium (SENOKOT-S) 8.6-50 MG tablet Take 1 tablet by mouth daily.   Yes [provider]  vitamin E 180 MG (400 UNITS) capsule Take 400 Units by mouth daily.   Yes [provider]  Zinc  50 MG TABS Take by mouth daily.   Yes [provider]  EPINEPHrine  (EPIPEN  2-PAK) 0.3 mg/0.3 mL IJ SOAJ injection Use as directed as needed 04/26/22     EPINEPHrine  0.3 mg/0.3 mL IJ SOAJ injection Inject as needed as directed Patient not taking: Reported on 11/21/2022 03/10/21      No results found.  Positive ROS: All other systems have been reviewed and were otherwise negative with the exception of those mentioned in the HPI and as above.  Physical Exam: General: Alert, no acute distress Cardiovascular: No pedal edema Respiratory: No cyanosis, no use of accessory musculature GI: No organomegaly, abdomen is soft and non-tender Skin: No lesions in the area of chief complaint Neurologic: Sensation intact distally Psychiatric: Patient is competent for consent with normal mood and affect Lymphatic: No axillary or cervical lymphadenopathy  MUSCULOSKELETAL:  Left lower extremity is warm and well-perfused.  No open wounds or lesions.  Assessment: Left knee lateral meniscus tear  Plan: Plan to proceed today with arthroscopic  surgery on the left knee.  We discussed risk of bleeding, infection, damage to surrounding nerves and vessels, stiffness, persistent pain, DVT as well as risk of anesthesia.  She has provided informed consent.  Plan for discharge home postop from PACU.    Selinda Belvie Gosling, MD Cell [phone]    09/04/2024 10:08 AM

## 2024-09-05 ENCOUNTER — Encounter (HOSPITAL_BASED_OUTPATIENT_CLINIC_OR_DEPARTMENT_OTHER): Payer: Self-pay | Admitting: Orthopedic Surgery
